# Patient Record
Sex: Male | Born: 1975 | Race: Black or African American | Hispanic: No | Marital: Single | Smoking: Current some day smoker
Health system: Southern US, Community
[De-identification: ages and names within clinical notes are randomized; demographics above are authoritative.]

## PROBLEM LIST (undated history)

## (undated) DIAGNOSIS — J189 Pneumonia, unspecified organism: Secondary | ICD-10-CM

## (undated) DIAGNOSIS — L732 Hidradenitis suppurativa: Secondary | ICD-10-CM

## (undated) DIAGNOSIS — I509 Heart failure, unspecified: Secondary | ICD-10-CM

## (undated) DIAGNOSIS — W3400XA Accidental discharge from unspecified firearms or gun, initial encounter: Secondary | ICD-10-CM

## (undated) HISTORY — PX: OTHER SURGICAL HISTORY: SHX169

---

## 2012-02-02 DIAGNOSIS — W3400XA Accidental discharge from unspecified firearms or gun, initial encounter: Secondary | ICD-10-CM

## 2012-02-02 HISTORY — DX: Accidental discharge from unspecified firearms or gun, initial encounter: W34.00XA

## 2016-03-04 HISTORY — PX: OTHER SURGICAL HISTORY: SHX169

## 2016-05-10 ENCOUNTER — Emergency Department (HOSPITAL_COMMUNITY): Payer: BLUE CROSS/BLUE SHIELD

## 2016-05-10 ENCOUNTER — Encounter (HOSPITAL_COMMUNITY): Payer: Self-pay

## 2016-05-10 ENCOUNTER — Inpatient Hospital Stay (HOSPITAL_COMMUNITY)
Admission: EM | Admit: 2016-05-10 | Discharge: 2016-05-18 | DRG: 286 | Disposition: A | Discharge status: Home / Self Care | Payer: BLUE CROSS/BLUE SHIELD | Attending: Internal Medicine | Admitting: Internal Medicine

## 2016-05-10 DIAGNOSIS — I513 Intracardiac thrombosis, not elsewhere classified: Secondary | ICD-10-CM | POA: Insufficient documentation

## 2016-05-10 DIAGNOSIS — I5021 Acute systolic (congestive) heart failure: Principal | ICD-10-CM | POA: Diagnosis present

## 2016-05-10 DIAGNOSIS — F17210 Nicotine dependence, cigarettes, uncomplicated: Secondary | ICD-10-CM | POA: Diagnosis present

## 2016-05-10 DIAGNOSIS — I428 Other cardiomyopathies: Secondary | ICD-10-CM | POA: Diagnosis not present

## 2016-05-10 DIAGNOSIS — L732 Hidradenitis suppurativa: Secondary | ICD-10-CM | POA: Diagnosis present

## 2016-05-10 DIAGNOSIS — I429 Cardiomyopathy, unspecified: Secondary | ICD-10-CM

## 2016-05-10 DIAGNOSIS — Z79899 Other long term (current) drug therapy: Secondary | ICD-10-CM | POA: Diagnosis not present

## 2016-05-10 DIAGNOSIS — Z872 Personal history of diseases of the skin and subcutaneous tissue: Secondary | ICD-10-CM | POA: Diagnosis not present

## 2016-05-10 DIAGNOSIS — D509 Iron deficiency anemia, unspecified: Secondary | ICD-10-CM | POA: Diagnosis present

## 2016-05-10 DIAGNOSIS — Z8249 Family history of ischemic heart disease and other diseases of the circulatory system: Secondary | ICD-10-CM

## 2016-05-10 DIAGNOSIS — R740 Nonspecific elevation of levels of transaminase and lactic acid dehydrogenase [LDH]: Secondary | ICD-10-CM

## 2016-05-10 DIAGNOSIS — R7989 Other specified abnormal findings of blood chemistry: Secondary | ICD-10-CM

## 2016-05-10 DIAGNOSIS — F1010 Alcohol abuse, uncomplicated: Secondary | ICD-10-CM | POA: Diagnosis present

## 2016-05-10 DIAGNOSIS — R000 Tachycardia, unspecified: Secondary | ICD-10-CM | POA: Diagnosis present

## 2016-05-10 DIAGNOSIS — Z8701 Personal history of pneumonia (recurrent): Secondary | ICD-10-CM

## 2016-05-10 DIAGNOSIS — R0602 Shortness of breath: Secondary | ICD-10-CM | POA: Diagnosis not present

## 2016-05-10 DIAGNOSIS — F17290 Nicotine dependence, other tobacco product, uncomplicated: Secondary | ICD-10-CM | POA: Diagnosis present

## 2016-05-10 DIAGNOSIS — I5082 Biventricular heart failure: Secondary | ICD-10-CM | POA: Diagnosis present

## 2016-05-10 DIAGNOSIS — K760 Fatty (change of) liver, not elsewhere classified: Secondary | ICD-10-CM | POA: Diagnosis present

## 2016-05-10 DIAGNOSIS — I509 Heart failure, unspecified: Secondary | ICD-10-CM

## 2016-05-10 DIAGNOSIS — R570 Cardiogenic shock: Secondary | ICD-10-CM | POA: Diagnosis present

## 2016-05-10 DIAGNOSIS — R945 Abnormal results of liver function studies: Secondary | ICD-10-CM

## 2016-05-10 DIAGNOSIS — R188 Other ascites: Secondary | ICD-10-CM | POA: Diagnosis present

## 2016-05-10 DIAGNOSIS — R0902 Hypoxemia: Secondary | ICD-10-CM | POA: Diagnosis present

## 2016-05-10 DIAGNOSIS — K7200 Acute and subacute hepatic failure without coma: Secondary | ICD-10-CM | POA: Diagnosis present

## 2016-05-10 DIAGNOSIS — R1010 Upper abdominal pain, unspecified: Secondary | ICD-10-CM

## 2016-05-10 DIAGNOSIS — K761 Chronic passive congestion of liver: Secondary | ICD-10-CM | POA: Diagnosis present

## 2016-05-10 DIAGNOSIS — Z9889 Other specified postprocedural states: Secondary | ICD-10-CM | POA: Diagnosis not present

## 2016-05-10 DIAGNOSIS — I426 Alcoholic cardiomyopathy: Secondary | ICD-10-CM | POA: Diagnosis present

## 2016-05-10 DIAGNOSIS — R252 Cramp and spasm: Secondary | ICD-10-CM | POA: Diagnosis present

## 2016-05-10 DIAGNOSIS — R0600 Dyspnea, unspecified: Secondary | ICD-10-CM | POA: Diagnosis present

## 2016-05-10 DIAGNOSIS — R7401 Elevation of levels of liver transaminase levels: Secondary | ICD-10-CM

## 2016-05-10 DIAGNOSIS — N189 Chronic kidney disease, unspecified: Secondary | ICD-10-CM | POA: Diagnosis present

## 2016-05-10 HISTORY — DX: Accidental discharge from unspecified firearms or gun, initial encounter: W34.00XA

## 2016-05-10 HISTORY — DX: Hidradenitis suppurativa: L73.2

## 2016-05-10 HISTORY — DX: Pneumonia, unspecified organism: J18.9

## 2016-05-10 LAB — ECHOCARDIOGRAM COMPLETE
AVLVOTPG: 2 mmHg
CHL CUP MV DEC (S): 201
E decel time: 201 msec
EERAT: 13.42
FS: 9 % — AB (ref 28–44)
HEIGHTINCHES: 72 in
IV/PV OW: 0.87
LA diam index: 2.59 cm/m2
LA vol index: 49.5 mL/m2
LA vol: 99.6 mL
LASIZE: 52 mm
LAVOLA4C: 97.5 mL
LEFT ATRIUM END SYS DIAM: 52 mm
LV E/e' medial: 13.42
LV PW d: 14 mm — AB (ref 0.6–1.1)
LV SIMPSON'S DISK: 22
LV TDI E'LATERAL: 5.52
LV dias vol index: 89 mL/m2
LV dias vol: 179 mL — AB (ref 62–150)
LV e' LATERAL: 5.52 cm/s
LV sys vol index: 70 mL/m2
LV sys vol: 140 mL — AB (ref 21–61)
LVEEAVG: 13.42
LVOT SV: 31 mL
LVOT VTI: 7.57 cm
LVOT area: 4.15 cm2
LVOT diameter: 23 mm
LVOT peak vel: 62 cm/s
Lateral S' vel: 6.83 cm/s
MV Peak grad: 2 mmHg
MV VTI: 102 cm
MV pk E vel: 74.1 m/s
MVPKAVEL: 36.1 m/s
RV sys press: 41 mmHg
Reg peak vel: 253 cm/s
Stroke v: 39 ml
TAPSE: 14.7 mm
TDI e' medial: 4.29
TR max vel: 253 cm/s
WEIGHTICAEL: 2800 [oz_av]

## 2016-05-10 LAB — I-STAT CHEM 8, ED
BUN: 32 mg/dL — AB (ref 6–20)
CHLORIDE: 102 mmol/L (ref 101–111)
CREATININE: 1.1 mg/dL (ref 0.61–1.24)
Calcium, Ion: 0.99 mmol/L — ABNORMAL LOW (ref 1.15–1.40)
Glucose, Bld: 75 mg/dL (ref 65–99)
HCT: 37 % — ABNORMAL LOW (ref 39.0–52.0)
Hemoglobin: 12.6 g/dL — ABNORMAL LOW (ref 13.0–17.0)
POTASSIUM: 5.2 mmol/L — AB (ref 3.5–5.1)
Sodium: 135 mmol/L (ref 135–145)
TCO2: 22 mmol/L (ref 0–100)

## 2016-05-10 LAB — COMPREHENSIVE METABOLIC PANEL
ALBUMIN: 2.7 g/dL — AB (ref 3.5–5.0)
ALK PHOS: 135 U/L — AB (ref 38–126)
ALT: 460 U/L — AB (ref 17–63)
AST: 758 U/L — ABNORMAL HIGH (ref 15–41)
Anion gap: 9 (ref 5–15)
BILIRUBIN TOTAL: 0.9 mg/dL (ref 0.3–1.2)
BUN: 25 mg/dL — ABNORMAL HIGH (ref 6–20)
CALCIUM: 8.6 mg/dL — AB (ref 8.9–10.3)
CO2: 21 mmol/L — ABNORMAL LOW (ref 22–32)
CREATININE: 1.17 mg/dL (ref 0.61–1.24)
Chloride: 101 mmol/L (ref 101–111)
GFR calc non Af Amer: >60 mL/min (ref >60)
Glucose, Bld: 77 mg/dL (ref 65–99)
Potassium: 4.6 mmol/L (ref 3.5–5.1)
Sodium: 131 mmol/L — ABNORMAL LOW (ref 135–145)
TOTAL PROTEIN: 9.3 g/dL — AB (ref 6.5–8.1)

## 2016-05-10 LAB — LIPASE, BLOOD: Lipase: 15 U/L (ref 11–51)

## 2016-05-10 LAB — CBC WITH DIFFERENTIAL/PLATELET
Basophils Absolute: 0 10*3/uL (ref 0.0–0.1)
Basophils Relative: 0 %
Eosinophils Absolute: 0.1 10*3/uL (ref 0.0–0.7)
Eosinophils Relative: 1 %
HEMATOCRIT: 32.1 % — AB (ref 39.0–52.0)
HEMOGLOBIN: 9.9 g/dL — AB (ref 13.0–17.0)
LYMPHS ABS: 3 10*3/uL (ref 0.7–4.0)
Lymphocytes Relative: 27 %
MCH: 24.6 pg — AB (ref 26.0–34.0)
MCHC: 30.8 g/dL (ref 30.0–36.0)
MCV: 79.9 fL (ref 78.0–100.0)
MONOS PCT: 10 %
Monocytes Absolute: 1.2 10*3/uL — ABNORMAL HIGH (ref 0.1–1.0)
NEUTROS ABS: 6.9 10*3/uL (ref 1.7–7.7)
NEUTROS PCT: 62 %
Platelets: 321 10*3/uL (ref 150–400)
RBC: 4.02 MIL/uL — ABNORMAL LOW (ref 4.22–5.81)
RDW: 20.2 % — ABNORMAL HIGH (ref 11.5–15.5)
WBC: 11.2 10*3/uL — ABNORMAL HIGH (ref 4.0–10.5)

## 2016-05-10 LAB — URINALYSIS, ROUTINE W REFLEX MICROSCOPIC
Bilirubin Urine: NEGATIVE
GLUCOSE, UA: NEGATIVE mg/dL
Hgb urine dipstick: NEGATIVE
Ketones, ur: NEGATIVE mg/dL
LEUKOCYTES UA: NEGATIVE
Nitrite: NEGATIVE
PH: 5 (ref 5.0–8.0)
Protein, ur: NEGATIVE mg/dL
SPECIFIC GRAVITY, URINE: 1.012 (ref 1.005–1.030)

## 2016-05-10 LAB — I-STAT TROPONIN, ED: Troponin i, poc: 0.21 ng/mL (ref 0.00–0.08)

## 2016-05-10 LAB — MRSA PCR SCREENING: MRSA BY PCR: NEGATIVE

## 2016-05-10 LAB — TROPONIN I: Troponin I: 0.21 ng/mL (ref <0.03)

## 2016-05-10 LAB — RAPID URINE DRUG SCREEN, HOSP PERFORMED
AMPHETAMINES: NOT DETECTED
BARBITURATES: NOT DETECTED
BENZODIAZEPINES: NOT DETECTED
Cocaine: NOT DETECTED
Opiates: POSITIVE — AB
TETRAHYDROCANNABINOL: NOT DETECTED

## 2016-05-10 LAB — I-STAT CG4 LACTIC ACID, ED
Lactic Acid, Venous: 5.49 mmol/L (ref 0.5–1.9)
Lactic Acid, Venous: 5.66 mmol/L (ref 0.5–1.9)

## 2016-05-10 LAB — BRAIN NATRIURETIC PEPTIDE: B Natriuretic Peptide: 1543 pg/mL — ABNORMAL HIGH (ref 0.0–100.0)

## 2016-05-10 LAB — MONONUCLEOSIS SCREEN: MONO SCREEN: NEGATIVE

## 2016-05-10 MED ORDER — SODIUM CHLORIDE 0.9% FLUSH: 10.0000 mL | INTRAVENOUS | Status: DC | PRN

## 2016-05-10 MED ORDER — HYDROMORPHONE HCL 1 MG/ML IJ SOLN
1.0000 mg | Freq: Once | INTRAMUSCULAR | Status: AC
  Administered 2016-05-10: 1 mg via INTRAVENOUS
  Filled 2016-05-10: qty 1

## 2016-05-10 MED ORDER — ACETAMINOPHEN 325 MG PO TABS: 650.0000 mg | ORAL_TABLET | ORAL | Status: DC | PRN

## 2016-05-10 MED ORDER — ONDANSETRON HCL 4 MG/2ML IJ SOLN: 4.0000 mg | Freq: Four times a day (QID) | INTRAMUSCULAR | Status: DC | PRN

## 2016-05-10 MED ORDER — SODIUM CHLORIDE 0.9 % IV SOLN: 250.0000 mL | INTRAVENOUS | Status: DC | PRN

## 2016-05-10 MED ORDER — PERFLUTREN LIPID MICROSPHERE
1.0000 mL | INTRAVENOUS | Status: AC | PRN
  Administered 2016-05-10 (×2): 1 mL via INTRAVENOUS
  Filled 2016-05-10: qty 10

## 2016-05-10 MED ORDER — FUROSEMIDE 10 MG/ML IJ SOLN
20.0000 mg | Freq: Once | INTRAMUSCULAR | Status: AC
  Administered 2016-05-10: 20 mg via INTRAVENOUS
  Filled 2016-05-10: qty 2

## 2016-05-10 MED ORDER — SODIUM CHLORIDE 0.9% FLUSH: 3.0000 mL | INTRAVENOUS | Status: DC | PRN

## 2016-05-10 MED ORDER — PANTOPRAZOLE SODIUM 40 MG IV SOLR
40.0000 mg | Freq: Once | INTRAVENOUS | Status: AC
  Administered 2016-05-10: 40 mg via INTRAVENOUS
  Filled 2016-05-10: qty 40

## 2016-05-10 MED ORDER — ONDANSETRON HCL 4 MG/2ML IJ SOLN
4.0000 mg | Freq: Once | INTRAMUSCULAR | Status: AC
  Administered 2016-05-10: 4 mg via INTRAVENOUS
  Filled 2016-05-10: qty 2

## 2016-05-10 MED ORDER — VANCOMYCIN HCL IN DEXTROSE 1-5 GM/200ML-% IV SOLN
1000.0000 mg | Freq: Once | INTRAVENOUS | Status: AC
  Administered 2016-05-10: 1000 mg via INTRAVENOUS
  Filled 2016-05-10: qty 200

## 2016-05-10 MED ORDER — HEPARIN (PORCINE) IN NACL 100-0.45 UNIT/ML-% IJ SOLN
2000.0000 [IU]/h | INTRAMUSCULAR | Status: DC
  Administered 2016-05-10: 1300 [IU]/h via INTRAVENOUS
  Administered 2016-05-12 – 2016-05-13 (×2): 1900 [IU]/h via INTRAVENOUS
  Administered 2016-05-13 – 2016-05-15 (×4): 2000 [IU]/h via INTRAVENOUS
  Filled 2016-05-10 (×10): qty 250

## 2016-05-10 MED ORDER — SODIUM CHLORIDE 0.9% FLUSH
10.0000 mL | Freq: Two times a day (BID) | INTRAVENOUS | Status: DC
  Administered 2016-05-11 – 2016-05-12 (×3): 10 mL

## 2016-05-10 MED ORDER — SODIUM CHLORIDE 0.9 % IV BOLUS (SEPSIS)
1000.0000 mL | Freq: Once | INTRAVENOUS | Status: AC
  Administered 2016-05-10: 1000 mL via INTRAVENOUS

## 2016-05-10 MED ORDER — SODIUM CHLORIDE 0.9% FLUSH
3.0000 mL | Freq: Two times a day (BID) | INTRAVENOUS | Status: DC
  Administered 2016-05-10 – 2016-05-18 (×10): 3 mL via INTRAVENOUS

## 2016-05-10 MED ORDER — PIPERACILLIN-TAZOBACTAM 3.375 G IVPB 30 MIN
3.3750 g | Freq: Once | INTRAVENOUS | Status: AC
  Administered 2016-05-10: 3.375 g via INTRAVENOUS
  Filled 2016-05-10: qty 50

## 2016-05-10 MED ORDER — HEPARIN BOLUS VIA INFUSION
4000.0000 [IU] | Freq: Once | INTRAVENOUS | Status: AC
  Administered 2016-05-10: 4000 [IU] via INTRAVENOUS

## 2016-05-10 MED ORDER — ORAL CARE MOUTH RINSE
15.0000 mL | Freq: Two times a day (BID) | OROMUCOSAL | Status: DC
  Administered 2016-05-10 – 2016-05-15 (×3): 15 mL via OROMUCOSAL
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
## 2016-05-10 NOTE — ED Triage Notes (Signed)
Patient sent to ED by [facility] in [place]. Patient states he was admitted to [facility] for Pneumonia and d/c Saturday, had follow-up apportionment today ad oxygen was in the 80's. Patient on 2 LPM of oxygen now sats at 100%. Complains of abdominal pain with n/v/d.

## 2016-05-10 NOTE — Progress Notes (Signed)
*  PRELIMINARY RESULTS* Echocardiogram 2D Echocardiogram has been performed with Definity.  Stacey Drain 05/10/2016, 3:41 PM

## 2016-05-10 NOTE — Progress Notes (Addendum)
   Patient transferred from [facility] with LVEF 15-20% and low output heart failure as well as LV apical mural thrombus, 2.4 x 1.5 cm. I have ordered a PICC line and seen patient. He is on IV heparin. He appears stable at this time. He denies CP and SOB. He is tachycardic HR 110. BP 129/101. He asked that I call his mother, Bonita Quin, to give her an update, which I did. Dr. Diona Browner will see tomorrow AM and then Dr. Gala Romney will assume care after lunch time tomorrow.   Cline Crock PA-C  [facility]

## 2016-05-10 NOTE — ED Notes (Signed)
Report given.

## 2016-05-10 NOTE — ED Notes (Signed)
Spoke with 4N to try report and the bed he is assigned is not going to be his.
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
## 2016-05-10 NOTE — ED Notes (Addendum)
Abnormal lab result.  Lactic Acid 5.66  Dr. Rinaldo Ratel informed.
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
## 2016-05-10 NOTE — ED Provider Notes (Signed)
Patient will be transferred to [facility] to be cared for by [facility] and the Heart failure team. The cardiologist Dr. Diona Browner felt like the patient should not wait in the emergency department at [facility] for a bed at [facility]. It was felt the patient should get down to the emergency department at [facility] to weight-bear to get any better at [facility].   Bethann Berkshire, MD 05/10/16 [phone]

## 2016-05-10 NOTE — Progress Notes (Signed)
Peripherally Inserted Central Catheter/Midline Placement  The IV Nurse has discussed with the patient and/or persons authorized to consent for the patient, the purpose of this procedure and the potential benefits and risks involved with this procedure.  The benefits include less needle sticks, lab draws from the catheter, and the patient may be discharged home with the catheter. Risks include, but not limited to, infection, bleeding, blood clot (thrombus formation), and puncture of an artery; nerve damage and irregular heartbeat and possibility to perform a PICC exchange if needed/ordered by physician.  Alternatives to this procedure were also discussed.  Bard Power PICC patient education guide, fact sheet on infection prevention and patient information card has been provided to patient /or left at bedside.    PICC/Midline Placement Documentation        Juliah Scadden, Lajean Manes 05/10/2016, 11:00 PM

## 2016-05-10 NOTE — Progress Notes (Signed)
Upon introduction and assessment of pt, pt verbalizes that he doesn't know why he is here, what where the results of the tests performed today nor what his plan of care is. On call NP called and informed that pt has several questions. Per cardiology note the results of echocardiogram were discussed with pt and his mother. NP on call asked to round on pt to answer his questions. Will continue to monitor and provide emotional support as possible for pt.

## 2016-05-10 NOTE — ED Provider Notes (Signed)
[facility]-EM[facility]Y DEPT Provider Note   CSN: [id number] Arrival date & time: 05/10/16  1058  By signing my name below, I, Modena Jansky, attest that this documentation has been prepared under the direction and in the presence of Bethann Berkshire, MD. Electronically Signed: Modena Jansky, Scribe. 05/10/2016. 11:32 AM.  History   Chief Complaint Chief Complaint  Patient presents with  . Shortness of Breath   The history is provided by the patient. No language interpreter was used.  Shortness of Breath  This is a new problem. The problem occurs intermittently.The current episode started 6 to 12 hours ago. The problem has been gradually improving. Associated symptoms include a fever (Subjective), vomiting and abdominal pain. Pertinent negatives include no headaches, no cough, no chest pain and no rash. Treatments tried: Oxygen therapy by ED. Associated medical issues include pneumonia.   HPI Comments: Paul Hahn is a 41 y.o. male who presents to the Emergency Department complaining of intermittent SOB that started today. He states he was admitted at a hospital in [place] for pneumonia last week and  discharged 6 days ago. He has been having GI symptoms since then and today he had a low oxygen saturation (~80%) at his follow-up appointment, so he was sent to the ED. He was given 2L/min oxygen in the ED with some relief. He reports associated fever (subjective), abdominal pain, nausea, vomiting, and diarrhea. Pt's temperature in the ED today was 98.1. He denies any other complaints.   Past Medical History:  Diagnosis Date  . Pneumonia     There are no active problems to display for this patient.   Past Surgical History:  Procedure Laterality Date  . CYST EXCISION         Home Medications    Prior to Admission medications   Not on File    Family History No family history on file.  Social History Social History  Substance Use Topics  . Smoking status: Current Every Day Smoker      Packs/day: 0.50    Types: Cigars, Cigarettes  . Smokeless tobacco: Never Used  . Alcohol use Yes     Comment: socially     Allergies   Bacitracin   Review of Systems Review of Systems  Constitutional: Positive for fever (Subjective). Negative for appetite change and fatigue.  HENT: Negative for congestion, ear discharge and sinus pressure.   Eyes: Negative for discharge.  Respiratory: Positive for shortness of breath. Negative for cough.   Cardiovascular: Negative for chest pain.  Gastrointestinal: Positive for abdominal pain, diarrhea, nausea and vomiting.  Genitourinary: Negative for frequency and hematuria.  Musculoskeletal: Negative for back pain.  Skin: Negative for rash.  Neurological: Negative for seizures and headaches.  Psychiatric/Behavioral: Negative for hallucinations.     Physical Exam Updated Vital Signs BP 126/89 (BP Location: Left Arm)   Pulse (!) 123   Temp 98.1 F (36.7 C) (Oral)   Resp 21   Ht 6' (1.829 m)   Wt 175 lb (79.4 kg)   SpO2 100%   BMI 23.73 kg/m   Physical Exam  Constitutional: He is oriented to person, place, and time. He appears well-developed.  HENT:  Head: Normocephalic.  Eyes: Conjunctivae and EOM are normal. No scleral icterus.  Neck: Neck supple. No thyromegaly present.  Cardiovascular: Regular rhythm.  Tachycardia present.  Exam reveals no gallop and no friction rub.   No murmur heard. Pulmonary/Chest: No stridor. Tachypnea noted. He has no wheezes. He has no rales. He exhibits no tenderness.  Abdominal: He exhibits no distension. There is tenderness. There is no rebound.  Moderate to severe epigastric tenderness.   Musculoskeletal: Normal range of motion. He exhibits no edema.  Lymphadenopathy:    He has no cervical adenopathy.  Neurological: He is oriented to person, place, and time. He exhibits normal muscle tone. Coordination normal.  Skin: No rash noted. No erythema.  Psychiatric: He has a normal mood and affect.  His behavior is normal.     ED Treatments / Results  DIAGNOSTIC STUDIES: Oxygen Saturation is 100% on 2L/min O2, normal by my interpretation.    COORDINATION OF CARE: 11:36 AM- Pt advised of plan for treatment and pt agrees.  Labs (all labs ordered are listed, but only abnormal results are displayed) Labs Reviewed  CBC WITH DIFFERENTIAL/PLATELET  COMPREHENSIVE METABOLIC PANEL  LIPASE, BLOOD  I-STAT CHEM 8, ED  I-STAT CG4 LACTIC ACID, ED  I-STAT TROPOININ, ED    EKG  EKG Interpretation None       Radiology No results found.  Procedures Procedures (including critical care time)  Medications Ordered in ED Medications  sodium chloride 0.9 % bolus 1,000 mL (not administered)  pantoprazole (PROTONIX) injection 40 mg (not administered)  ondansetron (ZOFRAN) injection 4 mg (not administered)     Initial Impression / Assessment and Plan / ED Course  I have reviewed the triage vital signs and the nursing notes.  Pertinent labs & imaging results that were available during my care of the patient were reviewed by me and considered in my medical decision making (see chart for details). CRITICAL CARE Performed by: Osceola Depaz L Total critical care time:49minutes Critical care time was exclusive of separately billable procedures and treating other patients. Critical care was necessary to treat or prevent imminent or life-threatening deterioration. Critical care was time spent personally by me on the following activities: development of treatment plan with patient and/or surrogate as well as nursing, discussions with consultants, evaluation of patient's response to treatment, examination of patient, obtaining history from patient or surrogate, ordering and performing treatments and interventions, ordering and review of laboratory studies, ordering and review of radiographic studies, pulse oximetry and re-evaluation of patient's condition.   patient with abdominal pain. Sepsis.  Congestive heart failure. He will be admitted to ICU. Cardiology consult pending. Surgical consult pending. Although surgery has stated that the patient will not have his gallbladder removed immediately. Patient will be admitted by medicine  Final Clinical Impressions(s) / ED Diagnoses   Final diagnoses:  None    New Prescriptions New Prescriptions   No medications on file   The chart was scribed for me under my direct supervision.  I personally performed the history, physical, and medical decision making and all procedures in the evaluation of this patient.Bethann Berkshire, MD 05/10/16 [phone]

## 2016-05-11 DIAGNOSIS — I429 Cardiomyopathy, unspecified: Secondary | ICD-10-CM

## 2016-05-11 DIAGNOSIS — R570 Cardiogenic shock: Secondary | ICD-10-CM

## 2016-05-11 DIAGNOSIS — K7200 Acute and subacute hepatic failure without coma: Secondary | ICD-10-CM

## 2016-05-11 DIAGNOSIS — R740 Nonspecific elevation of levels of transaminase and lactic acid dehydrogenase [LDH]: Secondary | ICD-10-CM

## 2016-05-11 LAB — BASIC METABOLIC PANEL
Anion gap: 8 (ref 5–15)
BUN: 27 mg/dL — AB (ref 6–20)
CO2: 19 mmol/L — ABNORMAL LOW (ref 22–32)
CREATININE: 1.42 mg/dL — AB (ref 0.61–1.24)
Calcium: 7.8 mg/dL — ABNORMAL LOW (ref 8.9–10.3)
Chloride: 105 mmol/L (ref 101–111)
Glucose, Bld: 92 mg/dL (ref 65–99)
POTASSIUM: 5 mmol/L (ref 3.5–5.1)
SODIUM: 132 mmol/L — AB (ref 135–145)

## 2016-05-11 LAB — HEPATIC FUNCTION PANEL
ALT: 492 U/L — AB (ref 17–63)
AST: 722 U/L — ABNORMAL HIGH (ref 15–41)
Albumin: 2 g/dL — ABNORMAL LOW (ref 3.5–5.0)
Alkaline Phosphatase: 130 U/L — ABNORMAL HIGH (ref 38–126)
BILIRUBIN INDIRECT: 0.6 mg/dL (ref 0.3–0.9)
Bilirubin, Direct: 0.3 mg/dL (ref 0.1–0.5)
TOTAL PROTEIN: 8.2 g/dL — AB (ref 6.5–8.1)
Total Bilirubin: 0.9 mg/dL (ref 0.3–1.2)

## 2016-05-11 LAB — CBC
HCT: 28.7 % — ABNORMAL LOW (ref 39.0–52.0)
Hemoglobin: 8.5 g/dL — ABNORMAL LOW (ref 13.0–17.0)
MCH: 23.5 pg — ABNORMAL LOW (ref 26.0–34.0)
MCHC: 29.6 g/dL — AB (ref 30.0–36.0)
MCV: 79.5 fL (ref 78.0–100.0)
PLATELETS: 280 10*3/uL (ref 150–400)
RBC: 3.61 MIL/uL — ABNORMAL LOW (ref 4.22–5.81)
RDW: 20.1 % — AB (ref 11.5–15.5)
WBC: 9.4 10*3/uL (ref 4.0–10.5)

## 2016-05-11 LAB — COOXEMETRY PANEL
CARBOXYHEMOGLOBIN: 1.5 % (ref 0.5–1.5)
CARBOXYHEMOGLOBIN: 1.5 % (ref 0.5–1.5)
Carboxyhemoglobin: 2.1 % — ABNORMAL HIGH (ref 0.5–1.5)
METHEMOGLOBIN: 0.8 % (ref 0.0–1.5)
Methemoglobin: 0.7 % (ref 0.0–1.5)
Methemoglobin: 0.6 % (ref 0.0–1.5)
O2 SAT: 43.6 %
O2 SAT: 37.2 %
O2 Saturation: 64 %
TOTAL HEMOGLOBIN: 8.9 g/dL — AB (ref 12.0–16.0)
Total hemoglobin: 8.8 g/dL — ABNORMAL LOW (ref 12.0–16.0)
Total hemoglobin: 8.9 g/dL — ABNORMAL LOW (ref 12.0–16.0)

## 2016-05-11 LAB — IRON AND TIBC
IRON: 11 ug/dL — AB (ref 45–182)
Saturation Ratios: 6 % — ABNORMAL LOW (ref 17.9–39.5)
TIBC: 188 ug/dL — ABNORMAL LOW (ref 250–450)
UIBC: 177 ug/dL

## 2016-05-11 LAB — TROPONIN I
TROPONIN I: 0.22 ng/mL — AB (ref <0.03)
Troponin I: 0.23 ng/mL (ref <0.03)

## 2016-05-11 LAB — LACTIC ACID, PLASMA
LACTIC ACID, VENOUS: 2.5 mmol/L — AB (ref 0.5–1.9)
Lactic Acid, Venous: 2.1 mmol/L (ref 0.5–1.9)

## 2016-05-11 LAB — HEPARIN LEVEL (UNFRACTIONATED)
HEPARIN UNFRACTIONATED: 0.43 [IU]/mL (ref 0.30–0.70)
Heparin Unfractionated: 0.1 IU/mL — ABNORMAL LOW (ref 0.30–0.70)
Heparin Unfractionated: 0.19 IU/mL — ABNORMAL LOW (ref 0.30–0.70)
Heparin Unfractionated: 0.32 IU/mL (ref 0.30–0.70)

## 2016-05-11 LAB — HIV ANTIBODY (ROUTINE TESTING W REFLEX): HIV SCREEN 4TH GENERATION: NONREACTIVE

## 2016-05-11 LAB — MAGNESIUM: Magnesium: 1.8 mg/dL (ref 1.7–2.4)

## 2016-05-11 MED ORDER — FOLIC ACID 1 MG PO TABS
1.0000 mg | ORAL_TABLET | Freq: Every day | ORAL | Status: DC
  Administered 2016-05-11 – 2016-05-18 (×8): 1 mg via ORAL
  Filled 2016-05-11 (×8): qty 1

## 2016-05-11 MED ORDER — VITAMIN B-1 100 MG PO TABS
100.0000 mg | ORAL_TABLET | Freq: Every day | ORAL | Status: DC
  Administered 2016-05-11 – 2016-05-18 (×8): 100 mg via ORAL
  Filled 2016-05-11 (×8): qty 1

## 2016-05-11 MED ORDER — MILRINONE LACTATE IN DEXTROSE 20-5 MG/100ML-% IV SOLN
0.1250 ug/kg/min | INTRAVENOUS | Status: DC
  Administered 2016-05-11 – 2016-05-12 (×2): 0.25 ug/kg/min via INTRAVENOUS
  Administered 2016-05-12: 0.375 ug/kg/min via INTRAVENOUS
  Administered 2016-05-13: 0.25 ug/kg/min via INTRAVENOUS
  Administered 2016-05-14: 0.125 ug/kg/min via INTRAVENOUS
  Filled 2016-05-11 (×6): qty 100

## 2016-05-11 MED ORDER — ZOLPIDEM TARTRATE 5 MG PO TABS
5.0000 mg | ORAL_TABLET | Freq: Every evening | ORAL | Status: DC | PRN
  Administered 2016-05-12: 5 mg via ORAL
  Filled 2016-05-11: qty 1

## 2016-05-11 MED ORDER — LORAZEPAM 2 MG/ML IJ SOLN: 2.0000 mg | INTRAMUSCULAR | Status: DC | PRN

## 2016-05-11 MED ORDER — HEPARIN BOLUS VIA INFUSION
2500.0000 [IU] | Freq: Once | INTRAVENOUS | Status: AC
  Administered 2016-05-11: 2500 [IU] via INTRAVENOUS
  Filled 2016-05-11: qty 2500

## 2016-05-11 MED ORDER — SPIRONOLACTONE 25 MG PO TABS
12.5000 mg | ORAL_TABLET | Freq: Every day | ORAL | Status: DC
  Administered 2016-05-11 – 2016-05-12 (×2): 12.5 mg via ORAL
  Filled 2016-05-11 (×2): qty 1

## 2016-05-11 MED ORDER — FUROSEMIDE 10 MG/ML IJ SOLN
80.0000 mg | Freq: Two times a day (BID) | INTRAMUSCULAR | Status: DC
  Administered 2016-05-11 – 2016-05-12 (×2): 80 mg via INTRAVENOUS
  Filled 2016-05-11 (×2): qty 8

## 2016-05-11 MED ORDER — DIGOXIN 125 MCG PO TABS
0.1250 mg | ORAL_TABLET | Freq: Every day | ORAL | Status: DC
  Administered 2016-05-11 – 2016-05-18 (×8): 0.125 mg via ORAL
  Filled 2016-05-11 (×9): qty 1

## 2016-05-11 MED ORDER — HEPARIN BOLUS VIA INFUSION
2000.0000 [IU] | Freq: Once | INTRAVENOUS | Status: AC
  Administered 2016-05-11: 2000 [IU] via INTRAVENOUS
  Filled 2016-05-11: qty 2000

## 2016-05-11 MED ORDER — ADULT MULTIVITAMIN W/MINERALS CH
1.0000 | ORAL_TABLET | Freq: Every day | ORAL | Status: DC
  Administered 2016-05-11 – 2016-05-18 (×7): 1 via ORAL
  Filled 2016-05-11 (×8): qty 1

## 2016-05-11 MED ORDER — MAGNESIUM SULFATE 2 GM/50ML IV SOLN
2.0000 g | Freq: Once | INTRAVENOUS | Status: AC
  Administered 2016-05-11: 2 g via INTRAVENOUS
  Filled 2016-05-11: qty 50

## 2016-05-11 NOTE — Progress Notes (Signed)
ANTICOAGULATION CONSULT NOTE - Follow-up Consult  Pharmacy Consult for heparin Indication: mural thrombus  Allergies  Allergen Reactions  . Bacitracin Rash    Patient Measurements: Height: 6' (182.9 cm) Weight: 164 lb 0.4 oz (74.4 kg) IBW/kg (Calculated) : 77.6 Heparin Dosing Weight: 74 kg  Vital Signs: Temp: 97.6 F (36.4 C) (03/10 0700) Temp Source: Oral (03/10 0700) BP: 126/99 (03/10 0700) Pulse Rate: 116 (03/10 0700)  Labs:  Recent Labs  05/10/16 1148 05/10/16 1215 05/10/16 1926 05/11/16 0011 05/11/16 0019 05/11/16 0650  HGB 9.9* 12.6*  --   --   --  8.5*  HCT 32.1* 37.0*  --   --   --  28.7*  PLT 321  --   --   --   --  280  HEPARINUNFRC  --   --   --  0.10*  --  0.19*  CREATININE 1.17 1.10  --   --   --  1.42*  TROPONINI  --   --  0.21*  --  0.22* 0.23*    Estimated Creatinine Clearance: 74.2 mL/min (by C-G formula based on SCr of 1.42 mg/dL (H)).  Assessment: 41 yo male on heparin for mural thrombus. Heparin level subtherapeutic 0.19 despite rate increase this last night to heparin drip 1600 units/hr. No issues with line or bleeding reported per RN.  Drop in h/h will watch.  pltc ok  Goal of Therapy:  Heparin level 0.3-0.7 units/ml Monitor platelets by anticoagulation protocol: Yes   Plan:  Rebolus heparin 2000 units Increase heparin gtt to 1800 units/hr F/u 6hr heparin level Daily HL and CBC while on heparin.  Leota Sauers Pharm.D. CPP, BCPS Clinical Pharmacist [phone] 05/11/2016 9:28 AM

## 2016-05-11 NOTE — Progress Notes (Signed)
ANTICOAGULATION CONSULT NOTE - Follow Up Consult  Pharmacy Consult for heparin Indication: mural thrombus  Labs:  Recent Labs  05/10/16 1148 05/10/16 1215 05/10/16 1926  05/11/16 0019 05/11/16 0650 05/11/16 1618 05/11/16 2241  HGB 9.9* 12.6*  --   --   --  8.5*  --   --   HCT 32.1* 37.0*  --   --   --  28.7*  --   --   PLT 321  --   --   --   --  280  --   --   HEPARINUNFRC  --   --   --   < >  --  0.19* 0.32 0.43  CREATININE 1.17 1.10  --   --   --  1.42*  --   --   TROPONINI  --   --  0.21*  --  0.22* 0.23*  --   --   < > = values in this interval not displayed.   Assessment/Plan:  41yo male therapeutic on heparin after rate changes. Will continue gtt at current rate and confirm stable with am labs.   Vernard Gambles, PharmD, BCPS  05/11/2016,11:25 PM

## 2016-05-11 NOTE — Progress Notes (Signed)
Pt heart rate increased to 140s on tele. When this RN entered room, pt was found resting with no complains of pain or sob although pt endorses a brief period of sob "just a little bit ago". Vitals as follows HR 116 BP 103/80 RR 27 and o2 sats 100% on 2liters. MD oncall notified and no orders received at this time. Will continue to monitor.

## 2016-05-11 NOTE — Progress Notes (Signed)
PROGRESS NOTE                                                                                                                                                                                                             Patient Demographics:    Paul Hahn, is a 41 y.o. male, DOB - 1975-09-05, ZOX:[id number]  Admit date - 05/10/2016   Admitting Physician Filbert Schilder, MD  Outpatient Primary MD for the patient is PROVIDER NOT IN SYSTEM  LOS - 1  Outpatient Specialists:None  Chief Complaint  Patient presents with  . Shortness of Breath       Brief Narrative   41 year old male with history of hydradenitis suppurativa, recently treated for pneumonia at [facility] (discharged on 3/3 on further 7 day course of Levaquin) was sent to the [facility] ED from PCP office after he was found to be hypoxic with O2 sat in the 80s. As per records she was diagnosed with right lower lobe pneumonia at [facility], ruled out for PE. He was also found to be anemic with hemoglobin of 7.7. After being discharged home he had severe nausea and vomiting and over the days had increasing shortness of breath unable to lie flat on his back. Denied any fevers, chills, recent travel, bowel or urinary symptoms, headache, loss of consciousness, sick contacts or use of illicit drugs.  In the ED he was tachycardic and tachypnea with O2 sat of 84% on room air. Given IV fluids. Blood work showed elevated lactic acid of 5.5, transaminitis with AST/ALT of 758/460 and alkaline phosphatase of 135, normal bilirubin. Stat 2-D echo showed mild LVH with severely reduced EF of 15-20%, concern for LV apical thrombus and diffuse hypokinesis.  Patient started on IV heparin, cardiology consulted and transferred to [facility] for further management.     Subjective:   Patient complains of shortness of breath on minimal exertion. Denies further  nausea or vomiting.   Assessment  & Plan :    Principal Problem: Acute Nonischemic cardiomyopathy Suspect acute recent viral infection. Associated shock liver.  PICC line placed and patient started on milrinone drip today. Monitor co-ox daily. Strict I/O. Heart failure team will follow.    Active Problems: transminitis and elevated lactic acid. Suspect shock liver. Recheck LFTs and lactate today. Check Hepatitis panel and HIV ab.  [place] abd shows fatty changes.     Apical mural thrombus On IV heparin. Further management per cardiology.   microcytic anemia  check iron panel     Code Status : full code  Family Communication  : none at [place]  Disposition Plan  :stepdown monitoring  Barriers For Discharge : active symptoms  Consults  :   cardiology  Procedures  :  Echo US abd  DVT Prophylaxis  :   IV heparin  Lab Results  Component Value Date   PLT 280 05/11/2016    Antibiotics  :   Anti-infectives    Start     Dose/Rate Route Frequency Ordered Stop   05/10/16 1230  piperacillin-tazobactam (ZOSYN) IVPB 3.375 g     3.375 g 100 mL/hr over 30 Minutes Intravenous  Once 05/10/16 1226 05/10/16 1310   05/10/16 1230  vancomycin (VANCOCIN) IVPB 1000 mg/200 mL premix     1,000 mg 200 mL/hr over 60 Minutes Intravenous  Once 05/10/16 1226 05/10/16 1411        Objective:   Vitals:   05/11/16 0700 05/11/16 0800 05/11/16 0900 05/11/16 1100  BP: (!) 126/99 (!) 124/99 (!) 123/96 (!) 135/105  Pulse: (!) 116 (!) 115 (!) 112 (!) 118  Resp: (!) 24 (!) 27 (!) 30 (!) 25  Temp: 97.6 F (36.4 C)   97.8 F (36.6 C)  TempSrc: Oral   Oral  SpO2: 100% 99% 100% 99%  Weight:      Height:        Wt Readings from Last 3 Encounters:  05/11/16 74.4 kg (164 lb 0.4 oz)     Intake/Output Summary (Last 24 hours) at 05/11/16 1228 Last data filed at 05/11/16 1100  Gross per 24 hour  Intake          1467.71 ml  Output             1000 ml  Net           467.71 ml      Physical Exam  Gen: fatigued HEENT:  pallor+, moist mucosa, supple neck Chest: clear b/l, no added sounds CVS: N S1&S2, no murmurs, rubs or gallop GI: soft,  ND, BS+, mild epigastric tenderness Musculoskeletal: warm, no edema     Data Review:    CBC  Recent Labs Lab 05/10/16 1148 05/10/16 1215 05/11/16 0650  WBC 11.2*  --  9.4  HGB 9.9* 12.6* 8.5*  HCT 32.1* 37.0* 28.7*  PLT 321  --  280  MCV 79.9  --  79.5  MCH 24.6*  --  23.5*  MCHC 30.8  --  29.6*  RDW 20.2*  --  20.1*  LYMPHSABS 3.0  --   --   MONOABS 1.2*  --   --   EOSABS 0.1  --   --   BASOSABS 0.0  --   --     Chemistries   Recent Labs Lab 05/10/16 1148 05/10/16 1215 05/11/16 0650  NA 131* 135 132*  K 4.6 5.2* 5.0  CL 101 102 105  CO2 21*  --  19*  GLUCOSE 77 75 92  BUN 25* 32* 27*  CREATININE 1.17 1.10 1.42*  CALCIUM 8.6*  --  7.8*  AST 758*  --   --   ALT 460*  --   --   ALKPHOS 135*  --   --   BILITOT 0.9  --   --    ------------------------------------------------------------------------------------------------------------------ No results for input(s): CHOL, HDL, LDLCALC, TRIG,  CHOLHDL, LDLDIRECT in the last 72 hours.  No results found for: HGBA1C ------------------------------------------------------------------------------------------------------------------ No results for input(s): TSH, T4TOTAL, T3FREE, THYROIDAB in the last 72 hours.  Invalid input(s): FREET3 ------------------------------------------------------------------------------------------------------------------ No results for input(s): VITAMINB12, FOLATE, FERRITIN, TIBC, IRON, RETICCTPCT in the last 72 hours.  Coagulation profile No results for input(s): INR, PROTIME in the last 168 hours.  No results for input(s): DDIMER in the last 72 hours.  Cardiac Enzymes  Recent Labs Lab 05/10/16 1926 05/11/16 0019 05/11/16 0650  TROPONINI 0.21* 0.22* 0.23*    ------------------------------------------------------------------------------------------------------------------    Component Value Date/Time   BNP 1,543.0 (H) 05/10/2016 1212    Inpatient Medications  Scheduled Meds: . mouth rinse  15 mL Mouth Rinse BID  . sodium chloride flush  10-40 mL Intracatheter Q12H  . sodium chloride flush  3 mL Intravenous Q12H   Continuous Infusions: . heparin 1,800 Units/hr (05/11/16 0943)  . milrinone 0.25 mcg/kg/min (05/11/16 0926)   PRN Meds:.sodium chloride, acetaminophen, ondansetron (ZOFRAN) IV, sodium chloride flush, sodium chloride flush  Micro Results Recent Results (from the past 240 hour(s))  Blood Culture (routine x 2)     Status: None (Preliminary result)   Collection Time: 05/10/16 12:31 PM  Result Value Ref Range Status   Specimen Description BLOOD LEFT ARM  Final   Special Requests BOTTLES DRAWN AEROBIC AND ANAEROBIC 6CC  Final   Culture PENDING  Incomplete   Report Status PENDING  Incomplete  Blood Culture (routine x 2)     Status: None (Preliminary result)   Collection Time: 05/10/16 12:39 PM  Result Value Ref Range Status   Specimen Description BLOOD LEFT ARM  Final   Special Requests BOTTLES DRAWN AEROBIC ONLY 4CC  Final   Culture PENDING  Incomplete   Report Status PENDING  Incomplete  MRSA PCR Screening     Status: None   Collection Time: 05/10/16  6:56 PM  Result Value Ref Range Status   MRSA by PCR NEGATIVE NEGATIVE Final    Comment:        The GeneXpert MRSA Assay (FDA approved for NASAL specimens only), is one component of a comprehensive MRSA colonization surveillance program. It is not intended to diagnose MRSA infection nor to guide or monitor treatment for MRSA infections.     Radiology Reports US Abdomen Complete  Result Date: 05/10/2016 CLINICAL DATA:  Nausea, vomiting, and diarrhea, abdominal pain and abdominal distention for 1 week. EXAM: ABDOMEN ULTRASOUND COMPLETE COMPARISON:  None. FINDINGS:  Gallbladder: The gallbladder wall is markedly thickened up at up to 13 mm. There is a small amount of pericholecystic fluid. The gallbladder is under distended. No stones are identified. Common bile duct: Diameter: 0.2 cm. Liver: No focal lesion. The liver appears dense with mildly increased echogenicity IVC: No abnormality visualized. Pancreas: Visualized portion unremarkable. Spleen: Size and appearance within normal limits. Perisplenic fluid is identified. Right Kidney: Length: 14.3 cm. No mass or hydronephrosis. Echogenicity is markedly increased. Left Kidney: Length: 13.8 cm. No mass or hydronephrosis. Echogenicity is markedly increased. Abdominal aorta: No aneurysm visualized. Other findings: Bilateral pleural effusions are seen. IMPRESSION: Bilateral pleural effusions. Marked increased echogenicity of both kidneys consistent with medical renal disease. Gallbladder wall thickening is likely due to underdistention and small volume of ascites. No stones are identified. Fatty infiltration of the liver. Electronically Signed   By: Drusilla Kanner M.D.   On: 05/10/2016 13:31   Dg Chest Portable 1 View  Result Date: 05/10/2016 CLINICAL DATA:  Cough, fever. EXAM: PORTABLE CHEST 1  VIEW COMPARISON:  None. FINDINGS: Moderate cardiomegaly is noted. No pneumothorax or pleural effusion is noted. Both lungs are clear. The visualized skeletal structures are unremarkable. IMPRESSION: Moderate cardiomegaly.  No acute cardiopulmonary abnormality seen. Electronically Signed   By: Lupita Raider, M.D.   On: 05/10/2016 11:53    Time Spent in minutes  35   Eddie North M.D on 05/11/2016 at 12:28 PM  Between 7am to 7pm - Pager - [phone]  After 7pm go to [email or web address] - password [facility]  [facility] -  Office  [phone]

## 2016-05-11 NOTE — Progress Notes (Signed)
Spoke with patient and visitor regarding visitation.  Patient states visitor drove here over 2.5 hrs with intent to stay with patient.  Informed patient and visitor that the visitor could stay only tonight, and that it would be clarified in the morning as to whether she would be allowed to stay while patient is in the ICU.  Currently patient is calm and is showing no signs of alcohol withdrawal at this time.

## 2016-05-11 NOTE — Progress Notes (Signed)
ANTICOAGULATION CONSULT NOTE - Follow-up Consult  Pharmacy Consult for heparin Indication: mural thrombus  Allergies  Allergen Reactions  . Bacitracin Rash    Patient Measurements: Height: 6' (182.9 cm) Weight: 163 lb 12.8 oz (74.3 kg) IBW/kg (Calculated) : 77.6 Heparin Dosing Weight: 74 kg  Vital Signs: Temp: 98 F (36.7 C) (03/10 0000) Temp Source: Oral (03/10 0000) BP: 131/100 (03/10 0000) Pulse Rate: 111 (03/10 0000)  Labs:  Recent Labs  05/10/16 1148 05/10/16 1215 05/10/16 1926 05/11/16 0011  HGB 9.9* 12.6*  --   --   HCT 32.1* 37.0*  --   --   PLT 321  --   --   --   HEPARINUNFRC  --   --   --  0.10*  CREATININE 1.17 1.10  --   --   TROPONINI  --   --  0.21*  --     Estimated Creatinine Clearance: 95.7 mL/min (by C-G formula based on SCr of 1.1 mg/dL).  Assessment: 41 yo male on heparin for mural thrombus. Heparin level subtherapeutic (0.1) on gtt at 1300 units/hr. No issues with line or bleeding reported per RN.  Goal of Therapy:  Heparin level 0.3-0.7 units/ml Monitor platelets by anticoagulation protocol: Yes   Plan:  Rebolus heparin 2500 units Increase heparin gtt to 1600 units/hr F/u 6hr heparin level Daily HL and CBC while on heparin.  Christoper Fabian, PharmD, BCPS Clinical pharmacist, pager [phone] 05/11/2016,12:51 AM

## 2016-05-11 NOTE — Progress Notes (Signed)
Progress Note  Patient Name: Paul Hahn Date of Encounter: 05/11/2016  Primary Cardiologist: Dr. Nicholes Mango (new CHF evaluation)  Subjective   Short of breath, still has PND/orthopnea. No palpitations or abdominal pain.  Inpatient Medications    Scheduled Meds: . mouth rinse  15 mL Mouth Rinse BID  . sodium chloride flush  10-40 mL Intracatheter Q12H  . sodium chloride flush  3 mL Intravenous Q12H   Continuous Infusions: . heparin 1,600 Units/hr (05/11/16 0101)   PRN Meds: sodium chloride, acetaminophen, ondansetron (ZOFRAN) IV, sodium chloride flush, sodium chloride flush   Vital Signs    Vitals:   05/10/16 2103 05/11/16 0000 05/11/16 0320 05/11/16 0700  BP: (!) 133/100 (!) 131/100 103/80 (!) 126/99  Pulse: (!) 108 (!) 111 (!) 116 (!) 116  Resp: (!) 25 (!) 23 (!) 26 (!) 24  Temp: 97.6 F (36.4 C) 98 F (36.7 C) 97.7 F (36.5 C) 97.6 F (36.4 C)  TempSrc: Oral Oral Oral Oral  SpO2: 100% 100% 100% 100%  Weight:   164 lb 0.4 oz (74.4 kg)   Height:        Intake/Output Summary (Last 24 hours) at 05/11/16 0905 Last data filed at 05/11/16 0800  Gross per 24 hour  Intake          1408.37 ml  Output             1000 ml  Net           408.37 ml   Filed Weights   05/10/16 1110 05/10/16 1852 05/11/16 0320  Weight: 175 lb (79.4 kg) 163 lb 12.8 oz (74.3 kg) 164 lb 0.4 oz (74.4 kg)    Telemetry    Sinus tachycardia. Personally reviewed.  ECG    Tracing from 05/11/2016 shows sinus tachycardia with rightward axis and repolarization abnormalities. Personally reviewed.  Physical Exam   GEN: Short of breath at rest.   Neck:  Elevated JVP. Cardiac: Indistinct PMI, RRR, S3 gallop.  Respiratory:  Decreased breath breath sounds at bases. GI: Soft, nontender, bowel sounds present. MS: No edema; No deformity. Neuro:  Nonfocal. Psych: Alert and oriented x 3. Normal affect.  Labs    Chemistry Recent Labs Lab 05/10/16 1148 05/10/16 1215 05/11/16 0650    NA 131* 135 132*  K 4.6 5.2* 5.0  CL 101 102 105  CO2 21*  --  19*  GLUCOSE 77 75 92  BUN 25* 32* 27*  CREATININE 1.17 1.10 1.42*  CALCIUM 8.6*  --  7.8*  PROT 9.3*  --   --   ALBUMIN 2.7*  --   --   AST 758*  --   --   ALT 460*  --   --   ALKPHOS 135*  --   --   BILITOT 0.9  --   --   GFRNONAA >60  --  >60  GFRAA >60  --  >60  ANIONGAP 9  --  8     Hematology Recent Labs Lab 05/10/16 1148 05/10/16 1215 05/11/16 0650  WBC 11.2*  --  9.4  RBC 4.02*  --  3.61*  HGB 9.9* 12.6* 8.5*  HCT 32.1* 37.0* 28.7*  MCV 79.9  --  79.5  MCH 24.6*  --  23.5*  MCHC 30.8  --  29.6*  RDW 20.2*  --  20.1*  PLT 321  --  280    Cardiac Enzymes Recent Labs Lab 05/10/16 1926 05/11/16 0019 05/11/16 0650  TROPONINI 0.21* 0.22* 0.23*  Recent Labs Lab 05/10/16 1219  TROPIPOC 0.21*     BNP Recent Labs Lab 05/10/16 1212  BNP 1,543.0*     Radiology    US Abdomen Complete  Result Date: 05/10/2016 CLINICAL DATA:  Nausea, vomiting, and diarrhea, abdominal pain and abdominal distention for 1 week. EXAM: ABDOMEN ULTRASOUND COMPLETE COMPARISON:  None. FINDINGS: Gallbladder: The gallbladder wall is markedly thickened up at up to 13 mm. There is a small amount of pericholecystic fluid. The gallbladder is under distended. No stones are identified. Common bile duct: Diameter: 0.2 cm. Liver: No focal lesion. The liver appears dense with mildly increased echogenicity IVC: No abnormality visualized. Pancreas: Visualized portion unremarkable. Spleen: Size and appearance within normal limits. Perisplenic fluid is identified. Right Kidney: Length: 14.3 cm. No mass or hydronephrosis. Echogenicity is markedly increased. Left Kidney: Length: 13.8 cm. No mass or hydronephrosis. Echogenicity is markedly increased. Abdominal aorta: No aneurysm visualized. Other findings: Bilateral pleural effusions are seen. IMPRESSION: Bilateral pleural effusions. Marked increased echogenicity of both kidneys consistent  with medical renal disease. Gallbladder wall thickening is likely due to underdistention and small volume of ascites. No stones are identified. Fatty infiltration of the liver. Electronically Signed   By: Drusilla Kanner M.D.   On: 05/10/2016 13:31   Dg Chest Portable 1 View  Result Date: 05/10/2016 CLINICAL DATA:  Cough, fever. EXAM: PORTABLE CHEST 1 VIEW COMPARISON:  None. FINDINGS: Moderate cardiomegaly is noted. No pneumothorax or pleural effusion is noted. Both lungs are clear. The visualized skeletal structures are unremarkable. IMPRESSION: Moderate cardiomegaly.  No acute cardiopulmonary abnormality seen. Electronically Signed   By: Lupita Raider, M.D.   On: 05/10/2016 11:53    Cardiac Studies   Echocardiogram 05/10/2016: Study Conclusions  - Left ventricle: The cavity size was moderately dilated. Wall   thickness was increased in a pattern of mild LVH. Systolic   function was severely reduced. The estimated ejection fraction   was in the range of 15% to 20%. LV apical echodensity consistent   with thrombus, measures 2.4 x 1.5 cm. Diffuse hypokinesis. The   study is not technically sufficient to allow evaluation of LV   diastolic function. - Ventricular septum: The contour showed diastolic flattening. - Mitral valve: There was mild regurgitation. - Left atrium: The atrium was moderately dilated. - Right ventricle: The cavity size was mildly to moderately   dilated. Systolic function was moderately reduced. - Right atrium: The atrium was severely dilated. Central venous   pressure (est): 15 mm Hg. - Atrial septum: The septum bowed from right to left, consistent   with increased right atrial pressure. - Pulmonary arteries: PA peak pressure: 41 mm Hg (S). - Pericardium, extracardiac: A trivial pericardial effusion was   identified posterior to the heart.  Impressions:  - Mild LVH with moderate chamber dilatation and LVEF in the range   of 15-20%. There is diffuse  hypokinesis, best contractile   function noted at the lateral base. Suggestive of nonischemic   cardiomyopathy. LV apical mural thrombus identified. There is   spontaneous echocontrast noted to mild degree and also low stroke   volume consistent with low cardiac output. Moderate left atrial   enlargement. Mild mitral regurgitation. Moderately dilated right   ventricle with moderately reduced contraction. Severe right   atrial enlargement. Moderate to severe tricuspid regurgitation   with estimated PASP 41 mmHg and evidence of elevated CVP. Trivial   posterior pericardial effusion.  Patient Profile     41 y.o. male the history  of tobacco abuse and recent reported pneumonia treated in [place], presents with progressive weakness and shortness of breath, found to be in low output heart failure with presumably acute biventricular dysfunction. LVEF 15-20% by echocardiogram with moderate right ventricular dysfunction. Also noted to have LV apical mural thrombus.  Assessment & Plan    1. Acute biventricular heart failure, LVEF 15-20% and moderate right ventricular dysfunction with evidence of low output and cardiogenic shock. PICC line placed with co-oximetry 44. Starting milrinone. Dr. Morrie Sheldon to see on heart failure team later today.  2. 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 Reports recent treatment for pneumonia in [place]. Could have potentially been viral etiology although details of hospitalization or not clear.  3. Tobacco abuse.  4. Elevated LFTs, presumably secondary to hepatic congestion and shock. Lactic acid also elevated at presentation.  5. Renal insufficiency, creatinine up to 1.4. Making recently urine output, 1000 cc out without diuretic.  Starting milrinone this morning as outlined above. Dr. Gala Romney will see him on rounds later this afternoon as well for formal heart failure consultation. Continue heparin with LV apical mural thrombus. Follow urine output and  creatinine closely. Will also transduce CVP as may need additional diuretic on top of inotropes. Plan to start further afterload reduction next depending on how he tolerates the above treatments.  Signed, Nona Dell, MD  05/11/2016, 9:05 AM

## 2016-05-11 NOTE — Progress Notes (Signed)
ANTICOAGULATION CONSULT NOTE - Follow-up Consult  Pharmacy Consult for heparin Indication: mural thrombus  Allergies  Allergen Reactions  . Bacitracin Rash    Patient Measurements: Height: 6' (182.9 cm) Weight: 164 lb 0.4 oz (74.4 kg) IBW/kg (Calculated) : 77.6 Heparin Dosing Weight: 74 kg  Vital Signs: Temp: 98.1 F (36.7 C) (03/10 1500) Temp Source: Oral (03/10 1500) BP: 127/101 (03/10 1500) Pulse Rate: 121 (03/10 1500)  Labs:  Recent Labs  05/10/16 1148 05/10/16 1215 05/10/16 1926 05/11/16 0011 05/11/16 0019 05/11/16 0650 05/11/16 1618  HGB 9.9* 12.6*  --   --   --  8.5*  --   HCT 32.1* 37.0*  --   --   --  28.7*  --   PLT 321  --   --   --   --  280  --   HEPARINUNFRC  --   --   --  0.10*  --  0.19* 0.32  CREATININE 1.17 1.10  --   --   --  1.42*  --   TROPONINI  --   --  0.21*  --  0.22* 0.23*  --     Estimated Creatinine Clearance: 74.2 mL/min (by C-G formula based on SCr of 1.42 mg/dL (H)).  Assessment: 41 yo male on heparin for mural thrombus. Heparin level is at the low end of goal on 1800 units/hr.  Goal of Therapy:  Heparin level 0.3-0.7 units/ml Monitor platelets by anticoagulation protocol: Yes   Plan:  -Increase heparin to 1850 units/hr to keep in goal -Heparin level later today to confirm  Harland German, Pharm D 05/11/2016 4:45 PM
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
## 2016-05-12 ENCOUNTER — Encounter (HOSPITAL_COMMUNITY): Payer: Self-pay

## 2016-05-12 LAB — HEPATITIS PANEL, ACUTE
HCV Ab: 0.1 s/co ratio (ref 0.0–0.9)
HEP A IGM: NEGATIVE
HEP B C IGM: NEGATIVE
Hepatitis B Surface Ag: NEGATIVE

## 2016-05-12 LAB — BASIC METABOLIC PANEL
Anion gap: 9 (ref 5–15)
BUN: 24 mg/dL — AB (ref 6–20)
CO2: 24 mmol/L (ref 22–32)
Calcium: 7.8 mg/dL — ABNORMAL LOW (ref 8.9–10.3)
Chloride: 97 mmol/L — ABNORMAL LOW (ref 101–111)
Creatinine, Ser: 1.2 mg/dL (ref 0.61–1.24)
GFR calc Af Amer: >60 mL/min (ref >60)
GFR calc non Af Amer: >60 mL/min (ref >60)
GLUCOSE: 190 mg/dL — AB (ref 65–99)
POTASSIUM: 3.3 mmol/L — AB (ref 3.5–5.1)
Sodium: 130 mmol/L — ABNORMAL LOW (ref 135–145)

## 2016-05-12 LAB — ABO/RH: ABO/RH(D): O POS

## 2016-05-12 LAB — CBC
HCT: 31.1 % — ABNORMAL LOW (ref 39.0–52.0)
HEMOGLOBIN: 9.5 g/dL — AB (ref 13.0–17.0)
MCH: 24 pg — AB (ref 26.0–34.0)
MCHC: 30.5 g/dL (ref 30.0–36.0)
MCV: 78.5 fL (ref 78.0–100.0)
Platelets: 260 10*3/uL (ref 150–400)
RBC: 3.96 MIL/uL — ABNORMAL LOW (ref 4.22–5.81)
RDW: 20 % — AB (ref 11.5–15.5)
WBC: 10.4 10*3/uL (ref 4.0–10.5)

## 2016-05-12 LAB — TYPE AND SCREEN
ABO/RH(D): O POS
ANTIBODY SCREEN: NEGATIVE

## 2016-05-12 LAB — COOXEMETRY PANEL
Carboxyhemoglobin: 2.3 % — ABNORMAL HIGH (ref 0.5–1.5)
Methemoglobin: 0.6 % (ref 0.0–1.5)
O2 Saturation: 71.6 %
TOTAL HEMOGLOBIN: 9.6 g/dL — AB (ref 12.0–16.0)

## 2016-05-12 LAB — HIV ANTIBODY (ROUTINE TESTING W REFLEX): HIV SCREEN 4TH GENERATION: NONREACTIVE

## 2016-05-12 LAB — HEPARIN LEVEL (UNFRACTIONATED): Heparin Unfractionated: 0.35 IU/mL (ref 0.30–0.70)

## 2016-05-12 MED ORDER — CHLORHEXIDINE GLUCONATE CLOTH 2 % EX PADS
6.0000 | MEDICATED_PAD | Freq: Every day | CUTANEOUS | Status: DC
  Administered 2016-05-13 – 2016-05-16 (×4): 6 via TOPICAL

## 2016-05-12 MED ORDER — SODIUM CHLORIDE 0.9% FLUSH
10.0000 mL | Freq: Two times a day (BID) | INTRAVENOUS | Status: DC
  Administered 2016-05-12: 10 mL

## 2016-05-12 MED ORDER — LOSARTAN POTASSIUM 25 MG PO TABS
12.5000 mg | ORAL_TABLET | Freq: Two times a day (BID) | ORAL | Status: DC
Start: 2016-05-12 — End: 2016-05-13
  Administered 2016-05-12: 12.5 mg via ORAL
  Filled 2016-05-12: qty 1

## 2016-05-12 MED ORDER — MAGNESIUM SULFATE 2 GM/50ML IV SOLN: 2.0000 g | Freq: Once | INTRAVENOUS | Status: DC

## 2016-05-12 MED ORDER — SODIUM CHLORIDE 0.9% FLUSH
10.0000 mL | INTRAVENOUS | Status: DC | PRN
  Administered 2016-05-14: 10 mL
  Filled 2016-05-12: qty 40

## 2016-05-12 MED ORDER — SPIRONOLACTONE 25 MG PO TABS
25.0000 mg | ORAL_TABLET | Freq: Every day | ORAL | Status: DC
  Administered 2016-05-13 – 2016-05-18 (×6): 25 mg via ORAL
  Filled 2016-05-12 (×6): qty 1

## 2016-05-12 MED ORDER — POTASSIUM CHLORIDE CRYS ER 20 MEQ PO TBCR
40.0000 meq | EXTENDED_RELEASE_TABLET | Freq: Two times a day (BID) | ORAL | Status: DC
  Administered 2016-05-12 (×2): 40 meq via ORAL
  Filled 2016-05-12 (×2): qty 2

## 2016-05-12 NOTE — Consult Note (Signed)
WOC Nurse wound consult note Reason for Consult:PAtient with history of hydradenitis suppurativa and is s/p extensive excision and unroofing of perineal and perianal lesions in January of this year at a [place] facility. Family has been providing care at home with NS dressings following cleansing with antimicrobial soap (liquid Dial) and thorough rinsing. Wound type: Surgical I&D at site of perineal and perianal hydradenitis suppurativa lesions Pressure Injury POA: No. Wound is POA, but it is not a PrI. Measurement: 8cm x 10cm, but 5cm toward left buttock is reepithelializing and near closure.  The main portion of wound now measures 8cm x 4cm x 0.1cm.  There is evidence of contraction at periphery and granulation throughout. Wound bed:Red, moist, granulating and contracting in center, reepithelialization occurring at left margin. Drainage (amount, consistency, odor) Small amount of serous drainage, no odor. Periwound: Intact, no maceration, no induration, no warmth Dressing procedure/placement/frequency: I will continue the surgical POC with twice daily saline moistened gauze dressings and an ABD pad topper.  These are to be held in place with mesh briefs, no tape is advised. Patient and family member are familiar with this dressing regimen and are able to perform self care upon discharge. WOC nursing team will not follow, but will remain available to this patient, the nursing and medical teams.  Please re-consult if needed. Thank you for this consultation. Ladona Mow, MSN, RN, GNP, [place]  Pager# [phone]

## 2016-05-12 NOTE — Progress Notes (Signed)
Pt. And several family members have been educated multiple times about visiting times and about having only two visitors in the room at a time.  Also educated about not having people bringing outside food.  He is very upset about visiting times because his family lives far away.  He was informed yesterday and was told to tell them the visiting times so they didn't show up during quiet hours.  Him and his family are still being not compliant with rules.

## 2016-05-12 NOTE — Progress Notes (Addendum)
Advanced Heart Failure Team Rounding Note   Reason for consult: Cardiogenic shock  Consulting MD: Mcdowell   HPI:    Reportedly admitted for PNA several weeks ago. Since that time has felt very rundown and SOB with any activity. Admitted with HF. Echo LVEF 10% with large apical thrombus. RV moderate to severe HK. Severe TR. Drinks about 1 case of beer per day + liquor. Smokes 0.5-1ppd.  Now on milrinone 0.375. Co-ox  37%-> 72%. Feels better. Diuresed well. Weight down 8 pounds.  Lactate down. Breathing improved. No orthopnea. K 3.3 SBP 120-130s  On heparin for LV clot. Hepatitis panels pending. 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 More comfortable HEENT: normal flat. Carotids 2+ bilat; no bruits. No lymphadenopathy or thryomegaly appreciated. Cor: PMI laterally displaced. Tachy regular + summation gallop. 2/6 TR/MR Lungs: clear Abdomen: soft, nontender, nondistended. Liver edge downNo bruits or masses. Good bowel sounds. Extremities: no cyanosis, clubbing, rash, edema. Warm. Multiple tattoos Neuro: alert & orientedx3, cranial nerves grossly intact. moves all 4 extremities w/o difficulty. Affect pleasant  Telemetry: Sinus tach 110-120  Labs: Basic Metabolic Panel:  Recent Labs Lab 05/10/16 1148 05/10/16 1215 05/11/16 0650 05/11/16 1745 05/12/16 0438  NA  131* 135 132*  --  130*  K 4.6 5.2* 5.0  --  3.3*  CL 101 102 105  --  97*  CO2 21*  --  19*  --  24  GLUCOSE 77 75 92  --  190*  BUN 25* 32* 27*  --  24*  CREATININE 1.17 1.10 1.42*  --  1.20  CALCIUM 8.6*  --  7.8*  --  7.8*  MG  --   --   --  1.8  --     Liver Function Tests:  Recent Labs Lab 05/10/16 1148 05/11/16 1144  AST 758* 722*  ALT 460* 492*  ALKPHOS 135* 130*  BILITOT 0.9 0.9  PROT 9.3* 8.2*  ALBUMIN 2.7* 2.0*    Recent Labs Lab 05/10/16 1148  LIPASE 15   No results for input(s): AMMONIA in the last 168 hours.  CBC:  Recent Labs Lab 05/10/16 1148 05/10/16 1215 05/11/16 0650 05/12/16 0438  WBC 11.2*  --  9.4 10.4  NEUTROABS 6.9  --   --   --   HGB 9.9* 12.6* 8.5* 9.5*  HCT 32.1* 37.0* 28.7* 31.1*  MCV 79.9  --  79.5 78.5  PLT 321  --  280 260    Cardiac Enzymes:  Recent Labs Lab 05/10/16 1926 05/11/16 0019 05/11/16 0650  TROPONINI 0.21* 0.22* 0.23*    BNP: BNP (last 3 results)  Recent Labs  05/10/16 1212  BNP 1,543.0*    ProBNP (last 3 results) No results for input(s): PROBNP in the last 8760  hours.   CBG: No results for input(s): GLUCAP in the last 168 hours.  Coagulation Studies: No results for input(s): LABPROT, INR in the last 72 hours.  Other results:   Imaging: US Abdomen Complete  Result Date: 05/10/2016 CLINICAL DATA:  Nausea, vomiting, and diarrhea, abdominal pain and abdominal distention for 1 week. EXAM: ABDOMEN ULTRASOUND COMPLETE COMPARISON:  None. FINDINGS: Gallbladder: The gallbladder wall is markedly thickened up at up to 13 mm. There is a small amount of pericholecystic fluid. The gallbladder is under distended. No stones are identified. Common bile duct: Diameter: 0.2 cm. Liver: No focal lesion. The liver appears dense with mildly increased echogenicity IVC: No abnormality visualized. Pancreas: Visualized portion unremarkable. Spleen: Size and appearance within normal limits. Perisplenic fluid is  identified. Right Kidney: Length: 14.3 cm. No mass or hydronephrosis. Echogenicity is markedly increased. Left Kidney: Length: 13.8 cm. No mass or hydronephrosis. Echogenicity is markedly increased. Abdominal aorta: No aneurysm visualized. Other findings: Bilateral pleural effusions are seen. IMPRESSION: Bilateral pleural effusions. Marked increased echogenicity of both kidneys consistent with medical renal disease. Gallbladder wall thickening is likely due to underdistention and small volume of ascites. No stones are identified. Fatty infiltration of the liver. Electronically Signed   By: Drusilla Kanner M.D.   On: 05/10/2016 13:31     Assessment:   1. Cardiogenic shock 2. 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 Large LV thrombus 4. Shock liver 5. CKD, 3   --renal u/s shows bilateral medico-renal disease 6. Iron deficient anemia 7. ETOH abuse    --drinks about 1 case beer + liquor daily 8. Ongoing tobacco use     --0.5-1ppd 9. Hydradrenitis suppurtivae    --s/p excision 1/18    --wound care now following  Plan/Discussion:     He was admitted with profound cardiogenic shock due to biventricular failure. Given appearance on echo, calcified LV thrombus and end-organ issues suspect his cardiomyopathy is not recent. Likely ETOH-related (versus viral).   Much improved with milrinone and diuresis. Co-ox 75%. CVP 3-4. Will turn down milrinone to 0.25. Stop lasix. Increase spiro. Add losartan 12.5 bid. Continue digoxin.   Will need R/L cath this week.   Continue heparin for LV thrombus. Discuss risk of CVA from embolic phenomenon.   Discuss need to avoid ETOG completely. HIV negative. Hepatitis panels pending.   Blood type O+. He is not candidate for transplant due to ETOH and tobacco use. Also not candidate for Impella with LV clot and RV dysfunction.  Not ideal VAD candidate with RV dysfunction  and ETOH use. I discussed risk of worsening HF, end-organ damage, death and CVA.   Watch for ETOH withdrawal.   Wound care following.   Total CCT 40 minutes.   Length of Stay: 2 Arvilla Meres MD 05/12/2016, 1:11 PM  Advanced Heart Failure Team Pager [phone] (M-F; 7a - 4p)  Please contact [facility] for night-coverage after hours (4p -7a ) and weekends on [email or web address]

## 2016-05-12 NOTE — Progress Notes (Signed)
ANTICOAGULATION CONSULT NOTE - Follow-up Consult  Pharmacy Consult for heparin Indication: mural thrombus  Allergies  Allergen Reactions  . Bacitracin Rash    Patient Measurements: Height: 6' (182.9 cm) Weight: 156 lb 12 oz (71.1 kg) IBW/kg (Calculated) : 77.6 Heparin Dosing Weight: 74 kg  Vital Signs: Temp: 98.6 F (37 C) (03/11 0440) Temp Source: Oral (03/11 0440) Pulse Rate: 123 (03/11 0500)  Labs:  Recent Labs  05/10/16 1148 05/10/16 1215 05/10/16 1926  05/11/16 0019 05/11/16 0650 05/11/16 1618 05/11/16 2241 05/12/16 0438 05/12/16 0439  HGB 9.9* 12.6*  --   --   --  8.5*  --   --  9.5*  --   HCT 32.1* 37.0*  --   --   --  28.7*  --   --  31.1*  --   PLT 321  --   --   --   --  280  --   --  260  --   HEPARINUNFRC  --   --   --   < >  --  0.19* 0.32 0.43  --  0.35  CREATININE 1.17 1.10  --   --   --  1.42*  --   --  1.20  --   TROPONINI  --   --  0.21*  --  0.22* 0.23*  --   --   --   --   < > = values in this interval not displayed.  Estimated Creatinine Clearance: 83.9 mL/min (by C-G formula based on SCr of 1.2 mg/dL).  Assessment: 41 yo male on heparin for mural thrombus. Heparin level 0.35 at low end of goal on heparin drip rate 1850 uts/hr.   No issues with line or bleeding reported per RN.  H/H low stable with PLTC wnl.    Goal of Therapy:  Heparin level 0.3-0.7 units/ml Monitor platelets by anticoagulation protocol: Yes   Plan:  Increase heparin gtt to 1900 units/hr Daily HL and CBC while on heparin.  Leota Sauers Pharm.D. CPP, BCPS Clinical Pharmacist [phone] 05/12/2016 7:54 AM

## 2016-05-13 DIAGNOSIS — R1010 Upper abdominal pain, unspecified: Secondary | ICD-10-CM

## 2016-05-13 DIAGNOSIS — K7200 Acute and subacute hepatic failure without coma: Secondary | ICD-10-CM

## 2016-05-13 DIAGNOSIS — R740 Nonspecific elevation of levels of transaminase and lactic acid dehydrogenase [LDH]: Secondary | ICD-10-CM

## 2016-05-13 DIAGNOSIS — R7401 Elevation of levels of liver transaminase levels: Secondary | ICD-10-CM

## 2016-05-13 LAB — CBC
HEMATOCRIT: 32.1 % — AB (ref 39.0–52.0)
HEMOGLOBIN: 9.6 g/dL — AB (ref 13.0–17.0)
MCH: 23.5 pg — ABNORMAL LOW (ref 26.0–34.0)
MCHC: 29.9 g/dL — ABNORMAL LOW (ref 30.0–36.0)
MCV: 78.5 fL (ref 78.0–100.0)
Platelets: 301 10*3/uL (ref 150–400)
RBC: 4.09 MIL/uL — AB (ref 4.22–5.81)
RDW: 20 % — ABNORMAL HIGH (ref 11.5–15.5)
WBC: 9.9 10*3/uL (ref 4.0–10.5)

## 2016-05-13 LAB — COOXEMETRY PANEL
CARBOXYHEMOGLOBIN: 1.5 % (ref 0.5–1.5)
Methemoglobin: 0.9 % (ref 0.0–1.5)
O2 SAT: 57.3 %
TOTAL HEMOGLOBIN: 11.4 g/dL — AB (ref 12.0–16.0)

## 2016-05-13 LAB — BASIC METABOLIC PANEL
ANION GAP: 5 (ref 5–15)
BUN: 15 mg/dL (ref 6–20)
CHLORIDE: 100 mmol/L — AB (ref 101–111)
CO2: 27 mmol/L (ref 22–32)
Calcium: 7.7 mg/dL — ABNORMAL LOW (ref 8.9–10.3)
Creatinine, Ser: 0.92 mg/dL (ref 0.61–1.24)
GFR calc non Af Amer: >60 mL/min (ref >60)
Glucose, Bld: 133 mg/dL — ABNORMAL HIGH (ref 65–99)
POTASSIUM: 4.2 mmol/L (ref 3.5–5.1)
SODIUM: 132 mmol/L — AB (ref 135–145)

## 2016-05-13 LAB — HEPATITIS PANEL, ACUTE
HCV Ab: 0.2 s/co ratio (ref 0.0–0.9)
HEP B S AG: NEGATIVE
Hep A IgM: NEGATIVE
Hep B C IgM: NEGATIVE

## 2016-05-13 LAB — HEPARIN LEVEL (UNFRACTIONATED): HEPARIN UNFRACTIONATED: 0.3 [IU]/mL (ref 0.30–0.70)

## 2016-05-13 MED ORDER — LOSARTAN POTASSIUM 25 MG PO TABS
25.0000 mg | ORAL_TABLET | Freq: Two times a day (BID) | ORAL | Status: DC
  Administered 2016-05-13 (×2): 25 mg via ORAL
  Filled 2016-05-13 (×2): qty 1

## 2016-05-13 MED ORDER — POTASSIUM CHLORIDE CRYS ER 20 MEQ PO TBCR
40.0000 meq | EXTENDED_RELEASE_TABLET | Freq: Every day | ORAL | Status: DC
  Administered 2016-05-13 – 2016-05-16 (×4): 40 meq via ORAL
  Filled 2016-05-13 (×5): qty 2

## 2016-05-13 NOTE — Progress Notes (Signed)
Initial Nutrition Assessment  DOCUMENTATION CODES:   Not applicable  INTERVENTION:   Low sodium diet education provided  Will provide HS snack.    NUTRITION DIAGNOSIS:   Inadequate oral intake related to  (ETOH abuse, cardiomyopathy) as evidenced by per patient/family report.  GOAL:   Patient will meet greater than or equal to 90% of their needs  MONITOR:   PO intake, I & O's, Weight trends  REASON FOR ASSESSMENT:   Consult Diet education  ASSESSMENT:   Pt with hx of ETOH abuse (drinks 1 case of beer and liquor daily) admitted with profound cardiogenic shock due to biventricular failure. Per heart failure team he is not candidate for transplant, Impella, and note ideal for VAD.    Medications reviewed and include: MVI, folic acid, thiamine, KCl Labs reviewed: Na 132 Pt reports that he had no knowledge of his heart issues before this admission. Reports ETOH use not daily but when he does drink it is a lot, usually on the weekends. He lives with his mom. He gets up at noon gets fast food before work. Usually has a snack at work but lately his intake has been decreased (few months). Reports weight loss over last few months with decreased appetite. Usual weight 175 lb. It is unclear the time frame and it is difficult to determine how much of what he has been eating. He does not usually cook and does mostly fast food.  Pt is hungry and meal completion has been 100% Nutrition-Focused physical exam completed. Findings are no fat depletion, mild muscle depletion in quadriceps, and no edema. Clavicles are visible but per pt this is his norm.  Nail beds are pale.   Education provided, see education record.   Diet Order:  Diet Heart Room service appropriate? Yes; Fluid consistency: Thin  Skin:  Wound (see comment) (hidradenitis suppurativa s/p I&D)  Last BM:  3/12  Height:   Ht Readings from Last 1 Encounters:  05/10/16 6' (1.829 m)    Weight:   Wt Readings from Last 1  Encounters:  05/13/16 149 lb 11.1 oz (67.9 kg)    Ideal Body Weight:  80.9 kg  BMI:  Body mass index is 20.3 kg/m.  Estimated Nutritional Needs:   Kcal:  2100-2300  Protein:  95-115 grams  Fluid:  > 2.1 L/day  EDUCATION NEEDS:   Education needs addressed  Kendell Bane RD, LDN, CNSC [phone] Pager [phone] After Hours Pager

## 2016-05-13 NOTE — Progress Notes (Signed)
Advanced Heart Failure Team Rounding Note   Reason for consult: Cardiogenic shock  Consulting MD: Mcdowell  HPI:    Reportedly admitted for PNA several weeks ago. Since that time has felt very rundown and SOB with any activity. Admitted with HF. Echo LVEF 10% with large apical thrombus. RV moderate to severe HK. Severe TR. Drinks about 1 case of beer per day + liquor. Smokes 0.5-1ppd.   Milrinone turned down yesterday from 0.375 to 0.25. Feels fine. No SOB. Big appetite.  Weight down 15 pounds total. CVP 4  Coox 72% -> 57.3% on milrinone 0.25 mcg/kg/min. K 4.2. Creatinine 0.92  On heparin for LV clot. Hepatitis panels pending. 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 NAD.  HEENT: Normal flat. Carotids 2+ bilat; no bruits. No thyromegaly or nodule noted.  Cor: PMI laterally displaced. Tachy regular + summation gallop. 2/6 TR/MR Lungs: clear Abdomen: soft, NT, ND, no HSM. No bruits or masses. +BS  Extremities: no cyanosis, clubbing, rash. Warm. Multiple tattoos. No edema. Neuro: alert & orientedx3, cranial nerves grossly intact. moves all 4 extremities w/o difficulty. Affect pleasant  Telemetry: Personally reviewed, Sinus tach Sinus tach 110-120  Labs: Basic Metabolic Panel:  Recent Labs Lab 05/10/16 1148 05/10/16 1215 05/11/16 0650 05/11/16 1745  05/12/16 0438 05/13/16 0515  NA 131* 135 132*  --  130* 132*  K 4.6 5.2* 5.0  --  3.3* 4.2  CL 101 102 105  --  97* 100*  CO2 21*  --  19*  --  24 27  GLUCOSE 77 75 92  --  190* 133*  BUN 25* 32* 27*  --  24* 15  CREATININE 1.17 1.10 1.42*  --  1.20 0.92  CALCIUM 8.6*  --  7.8*  --  7.8* 7.7*  MG  --   --   --  1.8  --   --     Liver Function Tests:  Recent Labs Lab 05/10/16 1148 05/11/16 1144  AST 758* 722*  ALT 460* 492*  ALKPHOS 135* 130*  BILITOT 0.9 0.9  PROT 9.3* 8.2*  ALBUMIN 2.7* 2.0*    Recent Labs Lab 05/10/16 1148  LIPASE 15   No results for input(s): AMMONIA in the last 168 hours.  CBC:  Recent Labs Lab 05/10/16 1148 05/10/16 1215 05/11/16 0650 05/12/16 0438 05/13/16 0515  WBC 11.2*  --  9.4 10.4 9.9  NEUTROABS 6.9  --   --   --   --   HGB 9.9* 12.6* 8.5* 9.5* 9.6*  HCT 32.1* 37.0* 28.7* 31.1* 32.1*  MCV 79.9  --  79.5 78.5 78.5  PLT 321  --  280 260 301    Cardiac Enzymes:  Recent Labs Lab 05/10/16 1926 05/11/16 0019 05/11/16 0650  TROPONINI 0.21*  0.22* 0.23*    BNP: BNP (last 3 results)  Recent Labs  05/10/16 1212  BNP 1,543.0*    ProBNP (last 3 results) No results for input(s): PROBNP in the last 8760 hours.   CBG: No results for input(s): GLUCAP in the last 168 hours.  Coagulation Studies: No results for input(s): LABPROT, INR in the last 72 hours.  Other results:   Imaging: No results found.  Scheduled medications . Chlorhexidine Gluconate Cloth  6 each Topical Q0600  . digoxin  0.125 mg Oral Daily  . folic acid  1 mg Oral Daily  . losartan  12.5 mg Oral BID  . mouth rinse  15 mL Mouth Rinse BID  . multivitamin with minerals  1 tablet Oral Daily  . potassium chloride  40 mEq Oral Daily  . sodium chloride flush  10-40 mL Intracatheter Q12H  . sodium chloride flush  10-40 mL Intracatheter Q12H  . sodium chloride flush  3 mL Intravenous Q12H  . spironolactone  25 mg Oral Daily  . thiamine  100 mg Oral  Daily   Infusion meds . heparin 1,900 Units/hr (05/13/16 0543)  . milrinone 0.25 mcg/kg/min (05/12/16 2140)   PRN meds sodium chloride, acetaminophen, LORazepam, ondansetron (ZOFRAN) IV, sodium chloride flush, sodium chloride flush, sodium chloride flush, zolpidem   Assessment:   1. Cardiogenic shock 2. 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 Large LV thrombus 4. Shock liver 5. CKD, 3   --renal u/s shows bilateral medico-renal disease 6. Iron deficient anemia 7. ETOH abuse    --drinks about 1 case beer + liquor daily 8. Ongoing tobacco use     --0.5-1ppd 9. Hydradrenitis suppurtivae    --s/p excision 1/18    --wound care now following  Plan/Discussion:    He was admitted with profound cardiogenic shock due to biventricular failure. Given appearance on echo, calcified LV thrombus and end-organ issues suspect his cardiomyopathy is not recent. Likely ETOH-related (versus viral).   Increase losartan 25 mg BID. Continue Spiro 25 mg daily. Continue digoxin 0.125 mg daily.  Coox 57.3% on milrinone 0.25 mcg/kg/min this am. CVP  4-5 this am. Will keep milrinone at current dose  Plan for R/L cath this week.    Continue heparin for LV thrombus. Discuss risk of CVA from embolic phenomenon.   Blood type O+. Poor candidate for advanced therapies with ETOH, tobacco use, LV clot, and RV dysfunction.   Watch for ETOH withdrawal. Discussed need to avoid ETOH completely. HIV and Hepatitis panels negative.   Wound care following.    Length of Stay: 3 Luane [employer]  05/13/2016, 8:18 AM  Advanced Heart Failure Team Pager [phone] (M-F; 7a - 4p)  Please contact [facility] for night-coverage after hours (4p -7a ) and weekends on [email or web address]   Patient seen and examined with Otilio Saber, PA-C. We discussed all aspects of the encounter. I agree with the assessment and plan as  stated above.   Had diuresed well but still quite tenuous. Co-ox marginal on milrinone 0.25. Very tachy. Will continue milrinone at current dose. Increase losartan. Continue to hold lasix.   Continue heparin for LV clot. Await hepatitis panels. No sign of ETOH withdrawal.   Plan R/L cath later in the week.   Wound care following for post-op gluteal wounds.   Bensimhon, Daniel,MD 3:51 PM

## 2016-05-13 NOTE — Progress Notes (Signed)
ANTICOAGULATION CONSULT NOTE - Follow-up Consult  Pharmacy Consult for heparin Indication: mural thrombus  Allergies  Allergen Reactions  . Bacitracin Rash    Patient Measurements: Height: 6' (182.9 cm) Weight: 149 lb 11.1 oz (67.9 kg) IBW/kg (Calculated) : 77.6 Heparin Dosing Weight: 74 kg  Vital Signs: Temp: 98.5 F (36.9 C) (03/12 0500) Temp Source: Oral (03/12 0500) BP: 128/89 (03/12 0700) Pulse Rate: 118 (03/12 0700)  Labs:  Recent Labs  05/10/16 1926  05/11/16 0019 05/11/16 0650  05/11/16 2241 05/12/16 0438 05/12/16 0439 05/13/16 0515  HGB  --   --   --  8.5*  --   --  9.5*  --  9.6*  HCT  --   --   --  28.7*  --   --  31.1*  --  32.1*  PLT  --   --   --  280  --   --  260  --  301  HEPARINUNFRC  --   < >  --  0.19*  < > 0.43  --  0.35 0.30  CREATININE  --   --   --  1.42*  --   --  1.20  --  0.92  TROPONINI 0.21*  --  0.22* 0.23*  --   --   --   --   --   < > = values in this interval not displayed.  Estimated Creatinine Clearance: 104.6 mL/min (by C-G formula based on SCr of 0.92 mg/dL).  Assessment: 41 yo male on heparin for mural thrombus. Heparin level 0.3 at low end of goal on heparin drip rate 1900 uts/hr.   No issues with line or bleeding reported per RN.  H/H low stable with PLTC wnl.    Goal of Therapy:  Heparin level 0.3-0.7 units/ml Monitor platelets by anticoagulation protocol: Yes   Plan:  Increase heparin gtt to 2000 units/hr Daily HL and CBC while on heparin.  Leota Sauers Pharm.D. CPP, BCPS Clinical Pharmacist [phone] 05/13/2016 8:16 AM

## 2016-05-14 LAB — CBC
HCT: 31 % — ABNORMAL LOW (ref 39.0–52.0)
HEMOGLOBIN: 9.3 g/dL — AB (ref 13.0–17.0)
MCH: 23.5 pg — AB (ref 26.0–34.0)
MCHC: 30 g/dL (ref 30.0–36.0)
MCV: 78.5 fL (ref 78.0–100.0)
Platelets: 278 10*3/uL (ref 150–400)
RBC: 3.95 MIL/uL — AB (ref 4.22–5.81)
RDW: 20 % — ABNORMAL HIGH (ref 11.5–15.5)
WBC: 8.4 10*3/uL (ref 4.0–10.5)

## 2016-05-14 LAB — BASIC METABOLIC PANEL
Anion gap: 4 — ABNORMAL LOW (ref 5–15)
BUN: 12 mg/dL (ref 6–20)
CO2: 25 mmol/L (ref 22–32)
Calcium: 7.5 mg/dL — ABNORMAL LOW (ref 8.9–10.3)
Chloride: 104 mmol/L (ref 101–111)
Creatinine, Ser: 0.85 mg/dL (ref 0.61–1.24)
GLUCOSE: 138 mg/dL — AB (ref 65–99)
Potassium: 3.9 mmol/L (ref 3.5–5.1)
Sodium: 133 mmol/L — ABNORMAL LOW (ref 135–145)

## 2016-05-14 LAB — COOXEMETRY PANEL
CARBOXYHEMOGLOBIN: 1.8 % — AB (ref 0.5–1.5)
Methemoglobin: 1 % (ref 0.0–1.5)
O2 SAT: 65 %
TOTAL HEMOGLOBIN: 8.3 g/dL — AB (ref 12.0–16.0)

## 2016-05-14 LAB — MAGNESIUM: MAGNESIUM: 1.6 mg/dL — AB (ref 1.7–2.4)

## 2016-05-14 LAB — HEPARIN LEVEL (UNFRACTIONATED): Heparin Unfractionated: 0.43 IU/mL (ref 0.30–0.70)

## 2016-05-14 MED ORDER — MAGNESIUM SULFATE 4 GM/100ML IV SOLN
4.0000 g | Freq: Once | INTRAVENOUS | Status: AC
  Administered 2016-05-14: 4 g via INTRAVENOUS
  Filled 2016-05-14: qty 100

## 2016-05-14 MED ORDER — SACUBITRIL-VALSARTAN 24-26 MG PO TABS
1.0000 | ORAL_TABLET | Freq: Two times a day (BID) | ORAL | Status: DC
  Administered 2016-05-14 – 2016-05-17 (×7): 1 via ORAL
  Filled 2016-05-14 (×7): qty 1

## 2016-05-14 NOTE — Progress Notes (Signed)
Advanced Heart Failure Team Rounding Note   Reason for consult: Cardiogenic shock  Consulting MD: Mcdowell  HPI:    Reportedly admitted for PNA several weeks ago. Since that time has felt very rundown and SOB with any activity. Admitted with HF. Echo LVEF 10% with large apical thrombus. RV moderate to severe HK. Severe TR. Drinks about 1 case of beer per day + liquor. Smokes 0.5-1ppd.  Coox 65% on milrinone 0.125 mcg/kg/min this am. K 3.9. Creatinine 0.85.   Denies SOB or CP. Occasionally having tachypalpitations. Nervous about LV clot.  Says that he is "done" with alcohol.   Out 800 cc. Weight unchanged. Pressures stable with medication adjustment.  On heparin for LV clot. Hepatitis panels negative. 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 NAD.   HEENT: Normal flat. Carotids 2+ bilat; no bruits. No thyromegaly or nodule noted.  Cor: PMI laterally displaced. Regular. Tachy. + summation gallop. 2/6 TR/MR Lungs: CTAB, normal effort.  Abdomen: soft, NT, ND, no HSM. No bruits or masses. +BS  Extremities: no cyanosis, clubbing, rash. Warm. Multiple tattoos. No edema. Neuro: alert & orientedx3, cranial nerves grossly intact. moves all 4 extremities w/o difficulty. Affect pleasant  Telemetry: Reviewed personally, remains in sinus tach 100-120s.   Labs: Basic  Metabolic Panel:  Recent Labs Lab 05/10/16 1148 05/10/16 1215 05/11/16 0650 05/11/16 1745 05/12/16 0438 05/13/16 0515 05/14/16 0502  NA 131* 135 132*  --  130* 132* 133*  K 4.6 5.2* 5.0  --  3.3* 4.2 3.9  CL 101 102 105  --  97* 100* 104  CO2 21*  --  19*  --  24 27 25   GLUCOSE 77 75 92  --  190* 133* 138*  BUN 25* 32* 27*  --  24* 15 12  CREATININE 1.17 1.10 1.42*  --  1.20 0.92 0.85  CALCIUM 8.6*  --  7.8*  --  7.8* 7.7* 7.5*  MG  --   --   --  1.8  --   --  1.6*    Liver Function Tests:  Recent Labs Lab 05/10/16 1148 05/11/16 1144  AST 758* 722*  ALT 460* 492*  ALKPHOS 135* 130*  BILITOT 0.9 0.9  PROT 9.3* 8.2*  ALBUMIN 2.7* 2.0*    Recent Labs Lab 05/10/16 1148  LIPASE 15   No results for input(s): AMMONIA in the last 168 hours.  CBC:  Recent Labs Lab 05/10/16 1148 05/10/16 1215 05/11/16 0650 05/12/16 0438 05/13/16 0515 05/14/16 0502  WBC 11.2*  --  9.4 10.4 9.9 8.4  NEUTROABS 6.9  --   --   --   --   --   HGB 9.9* 12.6* 8.5* 9.5* 9.6* 9.3*  HCT 32.1* 37.0* 28.7* 31.1* 32.1* 31.0*  MCV  79.9  --  79.5 78.5 78.5 78.5  PLT 321  --  280 260 301 278    Cardiac Enzymes:  Recent Labs Lab 05/10/16 1926 05/11/16 0019 05/11/16 0650  TROPONINI 0.21* 0.22* 0.23*    BNP: BNP (last 3 results)  Recent Labs  05/10/16 1212  BNP 1,543.0*    ProBNP (last 3 results) No results for input(s): PROBNP in the last 8760 hours.   CBG: No results for input(s): GLUCAP in the last 168 hours.  Coagulation Studies: No results for input(s): LABPROT, INR in the last 72 hours.  Other results:   Imaging: No results found.  Scheduled medications . Chlorhexidine Gluconate Cloth  6 each Topical Q0600  . digoxin  0.125 mg Oral Daily  . folic acid  1 mg Oral Daily  . losartan  25 mg Oral BID  . mouth rinse  15 mL Mouth Rinse BID  . multivitamin with minerals  1 tablet Oral Daily  . potassium chloride  40 mEq Oral Daily  . sodium chloride flush  3 mL  Intravenous Q12H  . spironolactone  25 mg Oral Daily  . thiamine  100 mg Oral Daily   Infusion meds . heparin 2,000 Units/hr (05/13/16 2004)  . milrinone 0.25 mcg/kg/min (05/13/16 1900)   PRN meds sodium chloride, acetaminophen, LORazepam, ondansetron (ZOFRAN) IV, sodium chloride flush, sodium chloride flush, zolpidem   Assessment:   1. Cardiogenic shock 2. 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 Large LV thrombus 4. Shock liver 5. CKD, 3   --renal u/s shows bilateral medico-renal disease 6. Iron deficient anemia 7. ETOH abuse    -- had been drinking about 1 case beer + liquor daily 8. Ongoing tobacco use     --0.5-1ppd 9. Hydradrenitis suppurtivae    --s/p excision 1/18    --wound care now following  Plan/Discussion:    He was admitted with profound cardiogenic shock due to biventricular failure. Given appearance on echo, calcified LV thrombus and end-organ issues suspect his cardiomyopathy is not recent. Likely ETOH-related (versus viral).   Continue losartan 25 mg BID. Consider switch to [facility].   Continue Spiro 25 mg daily. Continue digoxin 0.125 mg daily.  Coox 65% on milrinone 0.25 mcg/kg/min. CVP ~7 cm. Will wean milrinone to 0.125 mcg/kg/min and follow coox.   Holding off on BB with low output. Hesitant to use corlanor with low output and compensatory tachycardia.   Plan for R/L cath this week.    Continue heparin for LV thrombus. Discuss risk of CVA from embolic phenomenon.   Blood type O+. Poor candidate for advanced therapies with ETOH, tobacco use, LV clot, and RV dysfunction.   No signs of ETOH withdrawal. Needs to avoid completely. HIV and Hepatitis panels negative.   Wound care following for gluteal wounds.  Length of Stay: 4 [employer]  05/14/2016, 7:52 AM  Advanced Heart Failure Team Pager [phone] (M-F; 7a - 4p)  Please contact [facility] for night-coverage after hours (4p -7a ) and weekends on [email or web address]  Patient seen and examined with Otilio Saber, PA-C. We discussed all aspects of the encounter. I agree with the assessment and plan as stated above.   Improving slowly but still tenuous. Co-ox improved. Will cut milrinone to 0.125. Switch losartan to Entresto 24/26. Continue spiro and digoxin. Will likely add ivabradine tomorrow.   Follow CVP. If climbing start lasix 40 po  bid.   Plan R/L cath likely tomorrow or Thursday. I would prefer to do it off milrinone.   Continue heparin for LV clot. Start coumadin tomorrow.   Hepatitis panels negative.   Dalten Ambrosino,MD 9:40 AM

## 2016-05-14 NOTE — Progress Notes (Signed)
Left Release of Medical Record Consent with bedside nurse as requested by patient to obtain medical records needed by [facility] for Short Term Disability. Patient requested to nap and not be disturbed.

## 2016-05-14 NOTE — Progress Notes (Signed)
ANTICOAGULATION CONSULT NOTE - Follow-up Consult  Pharmacy Consult for heparin Indication: mural thrombus  Allergies  Allergen Reactions  . Bacitracin Rash    Patient Measurements: Height: 6' (182.9 cm) Weight: 149 lb 11.1 oz (67.9 kg) IBW/kg (Calculated) : 77.6 Heparin Dosing Weight: 74 kg  Vital Signs: Temp: 98.3 F (36.8 C) (03/13 0800) Temp Source: Oral (03/13 0800) BP: 122/90 (03/13 0800) Pulse Rate: 119 (03/13 0800)  Labs:  Recent Labs  05/12/16 0438 05/12/16 0439 05/13/16 0515 05/14/16 0502  HGB 9.5*  --  9.6* 9.3*  HCT 31.1*  --  32.1* 31.0*  PLT 260  --  301 278  HEPARINUNFRC  --  0.35 0.30 0.43  CREATININE 1.20  --  0.92 0.85    Estimated Creatinine Clearance: 113.2 mL/min (by C-G formula based on SCr of 0.85 mg/dL).  Assessment: 41 yo male on heparin for mural thrombus. Heparin level 0.43 at  goal on heparin drip rate 2000 uts/hr.   No issues with line or bleeding reported per RN.  H/H low stable with PLTC wnl.    Goal of Therapy:  Heparin level 0.3-0.7 units/ml Monitor platelets by anticoagulation protocol: Yes   Plan:  Increase heparin gtt to 2000 units/hr Daily HL and CBC while on heparin Follow up oral [facility] plan after cath lab this week  Leota Sauers Pharm.D. CPP, BCPS Clinical Pharmacist [phone] 05/14/2016 8:12 AM

## 2016-05-14 NOTE — Progress Notes (Addendum)
Benefit check requested for: corlanor 5mg  BID  Will post results when available.   Nia A Shealy  Lawerance Sabal, RN        Prior Berkley Harvey is required [phone]- 30 day supply copay will be $30, 90 supply copay will be $75    15:39, Notified Graciella Freer, PA-C of prior auth needed and copay.

## 2016-05-15 LAB — BASIC METABOLIC PANEL WITH GFR
Anion gap: 8 (ref 5–15)
BUN: 11 mg/dL (ref 6–20)
CO2: 24 mmol/L (ref 22–32)
Calcium: 8.2 mg/dL — ABNORMAL LOW (ref 8.9–10.3)
Chloride: 100 mmol/L — ABNORMAL LOW (ref 101–111)
Creatinine, Ser: 0.99 mg/dL (ref 0.61–1.24)
GFR calc Af Amer: >60 mL/min
GFR calc non Af Amer: >60 mL/min
Glucose, Bld: 145 mg/dL — ABNORMAL HIGH (ref 65–99)
Potassium: 4.3 mmol/L (ref 3.5–5.1)
Sodium: 132 mmol/L — ABNORMAL LOW (ref 135–145)

## 2016-05-15 LAB — PROTIME-INR
INR: 1.23
PROTHROMBIN TIME: 15.6 s — AB (ref 11.4–15.2)

## 2016-05-15 LAB — CBC
HCT: 33.8 % — ABNORMAL LOW (ref 39.0–52.0)
Hemoglobin: 10.2 g/dL — ABNORMAL LOW (ref 13.0–17.0)
MCH: 23.9 pg — ABNORMAL LOW (ref 26.0–34.0)
MCHC: 30.2 g/dL (ref 30.0–36.0)
MCV: 79.2 fL (ref 78.0–100.0)
Platelets: 338 10*3/uL (ref 150–400)
RBC: 4.27 MIL/uL (ref 4.22–5.81)
RDW: 20.1 % — ABNORMAL HIGH (ref 11.5–15.5)
WBC: 9.9 10*3/uL (ref 4.0–10.5)

## 2016-05-15 LAB — CULTURE, BLOOD (ROUTINE X 2)
CULTURE: NO GROWTH
Culture: NO GROWTH

## 2016-05-15 LAB — COOXEMETRY PANEL
Carboxyhemoglobin: 1.9 % — ABNORMAL HIGH (ref 0.5–1.5)
Methemoglobin: 0.7 % (ref 0.0–1.5)
O2 Saturation: 60.3 %
Total hemoglobin: 10.9 g/dL — ABNORMAL LOW (ref 12.0–16.0)

## 2016-05-15 LAB — HEPARIN LEVEL (UNFRACTIONATED): HEPARIN UNFRACTIONATED: 0.61 [IU]/mL (ref 0.30–0.70)

## 2016-05-15 LAB — MAGNESIUM: Magnesium: 2 mg/dL (ref 1.7–2.4)

## 2016-05-15 MED ORDER — WARFARIN VIDEO
Freq: Once | Status: AC
  Administered 2016-05-15: 16:00:00

## 2016-05-15 MED ORDER — WARFARIN SODIUM 7.5 MG PO TABS
7.5000 mg | ORAL_TABLET | Freq: Once | ORAL | Status: AC
  Administered 2016-05-15: 7.5 mg via ORAL
  Filled 2016-05-15: qty 1

## 2016-05-15 MED ORDER — COUMADIN BOOK
Freq: Once | Status: AC
  Administered 2016-05-15: 16:00:00
  Filled 2016-05-15: qty 1

## 2016-05-15 MED ORDER — HEPARIN (PORCINE) IN NACL 100-0.45 UNIT/ML-% IJ SOLN
1950.0000 [IU]/h | INTRAMUSCULAR | Status: DC
  Administered 2016-05-16: 1950 [IU]/h via INTRAVENOUS
  Filled 2016-05-15: qty 250

## 2016-05-15 MED ORDER — IVABRADINE HCL 5 MG PO TABS
2.5000 mg | ORAL_TABLET | Freq: Two times a day (BID) | ORAL | Status: DC
  Administered 2016-05-15 – 2016-05-17 (×4): 2.5 mg via ORAL
  Filled 2016-05-15 (×6): qty 1

## 2016-05-15 MED ORDER — WARFARIN - PHARMACIST DOSING INPATIENT
Freq: Every day | Status: DC
Start: 2016-05-15 — End: 2016-05-18
  Administered 2016-05-16 – 2016-05-17 (×2)

## 2016-05-15 NOTE — Plan of Care (Signed)
Problem: Activity: Goal: Risk for activity intolerance will decrease Outcome: Progressing Pt ambulating more frequently in the hall, while doing so denies SOB or increased weakness

## 2016-05-15 NOTE — Progress Notes (Signed)
Advanced Heart Failure Team Rounding Note   Reason for consult: Cardiogenic shock  Consulting MD: Mcdowell  HPI:    Reportedly admitted for PNA several weeks ago. Since that time has felt very rundown and SOB with any activity. Admitted with HF. Echo LVEF 10% with large apical thrombus. RV moderate to severe HK. Severe TR. Drinks about 1 case of beer per day + liquor. Smokes 0.5-1ppd.  Coox 60.3% on 0.125 milrinone this am. K 4.3. Creatinine 0.99  CVP 4-5 this am.   Feeling good this morning. Anxious about future.  States he walked for around an hour without any SOB.    + 160 cc. Weight stable.   On heparin for LV clot. Hepatitis panels negative. Blood type O positive   Wound care has seen for surgery in January 1/18 for gluteal hydradrenitris suppurtivae  Objective:    Vital Signs:   Temp:  [98.1 F (36.7 C)-99 F (37.2 C)] 98.7 F (37.1 C) (03/14 0505) Pulse Rate:  [119-120] 120 (03/14 0505) Resp:  [17-30] 24 (03/14 0700) BP: (110-131)/(79-103) 115/89 (03/14 0700) SpO2:  [98 %-99 %] 98 % (03/14 0505) Weight:  [149 lb 7.6 oz (67.8 kg)] 149 lb 7.6 oz (67.8 kg) (03/14 0500) Last BM Date: 05/14/16 Filed Weights   05/13/16 0500 05/14/16 0500 05/15/16 0500  Weight: 149 lb 11.1 oz (67.9 kg) 149 lb 11.1 oz (67.9 kg) 149 lb 7.6 oz (67.8 kg)    Physical Exam: CVP 4-5 this am.  General:  Lying in bed. NAD.    HEENT: Normal flat. Carotids 2+ bilat; no bruits. No thyromegaly or nodule noted.  Cor: PMI laterally displaced. Regular. Tachy. + summation gallop. 2/6 TR/MR Lungs: Mildly diminished basilar sounds.  Abdomen: soft, NT, ND, no HSM. No bruits or masses. +BS  Extremities: no cyanosis, clubbing, rash. Warm. Multiple tattoos. No peripheral edema. Neuro: alert & orientedx3, cranial nerves grossly intact. moves all 4 extremities w/o difficulty. Affect pleasant  Telemetry: Personally reviewed, Sinus tach 110-120s     Labs: Basic Metabolic Panel:  Recent Labs Lab  05/11/16 0650 05/11/16 1745 05/12/16 0438 05/13/16 0515 05/14/16 0502 05/15/16 0513  NA 132*  --  130* 132* 133* 132*  K 5.0  --  3.3* 4.2 3.9 4.3  CL 105  --  97* 100* 104 100*  CO2 19*  --  24 27 25 24   GLUCOSE 92  --  190* 133* 138* 145*  BUN 27*  --  24* 15 12 11   CREATININE 1.42*  --  1.20 0.92 0.85 0.99  CALCIUM 7.8*  --  7.8* 7.7* 7.5* 8.2*  MG  --  1.8  --   --  1.6* 2.0    Liver Function Tests:  Recent Labs Lab 05/10/16 1148 05/11/16 1144  AST 758* 722*  ALT 460* 492*  ALKPHOS 135* 130*  BILITOT 0.9 0.9  PROT 9.3* 8.2*  ALBUMIN 2.7* 2.0*    Recent Labs Lab 05/10/16 1148  LIPASE 15   No results for input(s): AMMONIA in the last 168 hours.  CBC:  Recent Labs Lab 05/10/16 1148  05/11/16 0650 05/12/16 0438 05/13/16 0515 05/14/16 0502 05/15/16 0513  WBC 11.2*  --  9.4 10.4 9.9 8.4 9.9  NEUTROABS 6.9  --   --   --   --   --   --   HGB 9.9*  < > 8.5* 9.5* 9.6* 9.3* 10.2*  HCT 32.1*  < > 28.7* 31.1* 32.1* 31.0* 33.8*  MCV 79.9  --  79.5 78.5 78.5 78.5 79.2  PLT 321  --  280 260 301 278 338  < > = values in this interval not displayed.  Cardiac Enzymes:  Recent Labs Lab 05/10/16 1926 05/11/16 0019 05/11/16 0650  TROPONINI 0.21* 0.22* 0.23*    BNP: BNP (last 3 results)  Recent Labs  05/10/16 1212  BNP 1,543.0*    ProBNP (last 3 results) No results for input(s): PROBNP in the last 8760 hours.   CBG: No results for input(s): GLUCAP in the last 168 hours.  Coagulation Studies: No results for input(s): LABPROT, INR in the last 72 hours.  Other results:   Imaging: No results found.  Scheduled medications . Chlorhexidine Gluconate Cloth  6 each Topical Q0600  . digoxin  0.125 mg Oral Daily  . folic acid  1 mg Oral Daily  . mouth rinse  15 mL Mouth Rinse BID  . multivitamin with minerals  1 tablet Oral Daily  . potassium chloride  40 mEq Oral Daily  . sacubitril-valsartan  1 tablet Oral BID  . sodium chloride flush  3 mL  Intravenous Q12H  . spironolactone  25 mg Oral Daily  . thiamine  100 mg Oral Daily   Infusion meds . heparin 2,000 Units/hr (05/15/16 0009)  . milrinone 0.125 mcg/kg/min (05/14/16 1900)   PRN meds sodium chloride, acetaminophen, LORazepam, ondansetron (ZOFRAN) IV, sodium chloride flush, sodium chloride flush, zolpidem   Assessment:   1. Cardiogenic shock 2. 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 Large LV thrombus 4. Shock liver 5. CKD, 3   --renal u/s shows bilateral medico-renal disease 6. Iron deficient anemia 7. ETOH abuse    -- had been drinking about 1 case beer + liquor daily 8. Ongoing tobacco use     --0.5-1ppd 9. Hydradrenitis suppurtivae    --s/p excision 1/18    --wound care now following  Plan/Discussion:    He was admitted with profound cardiogenic shock due to biventricular failure. Given appearance on echo, calcified LV thrombus and end-organ issues suspect his cardiomyopathy is not recent. Likely ETOH-related (versus viral).   Continue Entresto 24/26 mg BID.    Continue Spiro 25 mg daily. Continue digoxin 0.125 mg daily.  Coox 60.3% on milrinone 0.125 mcg/kg/min.  Would like off for RHC.  Will stop this am and discuss cath timing with MD.   CVP 4-5 this am. Continue to follow for need lasix.   No BB with marginal output and negative inotropy.  Will consider corlanor.   Continue heparin for LV thrombus. Discuss risk of CVA from embolic phenomenon.   Blood type O+. Poor candidate for advanced therapies with ETOH, tobacco use, LV clot, and RV dysfunction. No change.   Continue to follow for symptoms of ETOH withdrawal. Needs to stop completely. HIV/Hep panels negative.    Wound care following for gluteal wounds.  Length of Stay: [address] [employer]  05/15/2016, 7:40 AM  Advanced Heart Failure Team Pager [phone] (M-F; 7a - 4p)  Please contact  [facility] for night-coverage after hours (4p -7a ) and weekends on [email or web address]  Patient seen and examined with Otilio Saber, PA-C. We discussed all aspects of the encounter. I agree with the assessment and plan as stated above.   Co-ox marginal but stable on low-dose milrinone. Will stop milrinone today. CVP remains low. Will hold lasix one more day.   Plan R/L heart cath  tomorrow. Procedure d/w him and his family.   Continue heparin for LV clot. Start warfarin.   Will add corlanor.   Continue to mobilize.   Bensimhon, Daniel,MD 6:00 PM

## 2016-05-15 NOTE — Consult Note (Signed)
WOC Nurse wound consult note Reason for Consult: head lesion Patient with history of hydradenitis suppurativa, patient reports several surgical procedures for this problem.  He has a new area on his head, occipital region.  He reports surgical intervention on this same area several years ago but is unable to tell me exactly when.  Minimal drainage on the pillow case, appears serosanguinous.   Wound type: draining lesion related to hydradenitis Measurement:small area noted, open 0.2cm x 0.2cm x 0.1cm Wound ZOX:WRUEAV to visualize  Drainage (amount, consistency, odor) see above Periwound: hair Dressing procedure/placement/frequency: I am not able to really recommend topical care due to location.  I asked him what he did for this area before, he said "what do you mean". What did you put on the area. He reports that he had the surgery and then "grew is hair to cover it up"  He has thick dread like hair and it is not going to be feasible to put any dressings on his head unless we shave his hair, he does not want to do this.  If becomes bothersome or a concern for a source of infection surgery team may need to evaluate the area.  Discussed POC with patient and bedside nurse.  Re consult if needed, will not follow at this time. Thanks  Porchea Charrier [employer], RN,CWOCN, CNS [phone])

## 2016-05-15 NOTE — Progress Notes (Signed)
ANTICOAGULATION CONSULT NOTE - Follow Up Consult  Pharmacy Consult for Heparin; add Coumadin Indication: mural thrombus  Allergies  Allergen Reactions  . Bacitracin Rash    Patient Measurements: Height: 6' (182.9 cm) Weight: 149 lb 7.6 oz (67.8 kg) IBW/kg (Calculated) : 77.6  Vital Signs: Temp: 98.2 F (36.8 C) (03/14 1246) Temp Source: Oral (03/14 1246) BP: 115/90 (03/14 1246) Pulse Rate: 113 (03/14 1246)  Labs:  Recent Labs  05/13/16 0515 05/14/16 0502 05/15/16 0513  HGB 9.6* 9.3* 10.2*  HCT 32.1* 31.0* 33.8*  PLT 301 278 338  HEPARINUNFRC 0.30 0.43 0.61  CREATININE 0.92 0.85 0.99    Estimated Creatinine Clearance: 97 mL/min (by C-G formula based on SCr of 0.99 mg/dL).   Medications:  Heparin @ 2000 units/hr  Assessment: 40yom continues on heparin for mural thrombus. Heparin level therapeutic at 0.61 but trending up. Will add coumadin today. Coumadin score is 10. Baseline INR pending. LFTs were elevated but trending down. No drug interactions identified.  Goal of Therapy:  INR 2-3 Monitor platelets by anticoagulation protocol: Yes   Plan:  1) Decrease heparin to 1950 units/hr 2) Coumadin 7.5mg  x 1 3) Coumadin education - book/video 4) Daily INR, heparin level, CBC  Fredrik Rigger 05/15/2016,2:20 PM

## 2016-05-16 ENCOUNTER — Telehealth (HOSPITAL_COMMUNITY): Payer: Self-pay

## 2016-05-16 ENCOUNTER — Encounter (HOSPITAL_COMMUNITY)
Admission: EM | Disposition: A | Discharge status: Home / Self Care | Payer: Self-pay | Source: Home / Self Care | Attending: Internal Medicine

## 2016-05-16 HISTORY — PX: RIGHT/LEFT HEART CATH AND CORONARY ANGIOGRAPHY: CATH118266

## 2016-05-16 LAB — HEPARIN LEVEL (UNFRACTIONATED): HEPARIN UNFRACTIONATED: 0.37 [IU]/mL (ref 0.30–0.70)

## 2016-05-16 LAB — POCT I-STAT 3, VENOUS BLOOD GAS (G3P V)
ACID-BASE DEFICIT: 1 mmol/L (ref 0.0–2.0)
Acid-base deficit: 3 mmol/L — ABNORMAL HIGH (ref 0.0–2.0)
BICARBONATE: 23.7 mmol/L (ref 20.0–28.0)
Bicarbonate: 21.5 mmol/L (ref 20.0–28.0)
O2 Saturation: 55 %
O2 Saturation: 56 %
PCO2 VEN: 37 mmHg — AB (ref 44.0–60.0)
PH VEN: 7.371 (ref 7.250–7.430)
PH VEN: 7.376 (ref 7.250–7.430)
PO2 VEN: 30 mmHg — AB (ref 32.0–45.0)
PO2 VEN: 30 mmHg — AB (ref 32.0–45.0)
TCO2: 23 mmol/L (ref 0–100)
TCO2: 25 mmol/L (ref 0–100)
pCO2, Ven: 40.4 mmHg — ABNORMAL LOW (ref 44.0–60.0)

## 2016-05-16 LAB — CBC
HCT: 33.3 % — ABNORMAL LOW (ref 39.0–52.0)
HEMOGLOBIN: 9.8 g/dL — AB (ref 13.0–17.0)
MCH: 23.4 pg — ABNORMAL LOW (ref 26.0–34.0)
MCHC: 29.4 g/dL — AB (ref 30.0–36.0)
MCV: 79.5 fL (ref 78.0–100.0)
Platelets: 323 10*3/uL (ref 150–400)
RBC: 4.19 MIL/uL — ABNORMAL LOW (ref 4.22–5.81)
RDW: 20 % — ABNORMAL HIGH (ref 11.5–15.5)
WBC: 10 10*3/uL (ref 4.0–10.5)

## 2016-05-16 LAB — POCT I-STAT 3, ART BLOOD GAS (G3+)
Acid-base deficit: 1 mmol/L (ref 0.0–2.0)
Bicarbonate: 22.7 mmol/L (ref 20.0–28.0)
O2 SAT: 95 %
PCO2 ART: 34.3 mmHg (ref 32.0–48.0)
TCO2: 24 mmol/L (ref 0–100)
pH, Arterial: 7.429 (ref 7.350–7.450)
pO2, Arterial: 76 mmHg — ABNORMAL LOW (ref 83.0–108.0)

## 2016-05-16 LAB — COMPREHENSIVE METABOLIC PANEL
ALT: 160 U/L — AB (ref 17–63)
AST: 104 U/L — ABNORMAL HIGH (ref 15–41)
Albumin: 2.4 g/dL — ABNORMAL LOW (ref 3.5–5.0)
Alkaline Phosphatase: 101 U/L (ref 38–126)
Anion gap: 7 (ref 5–15)
BILIRUBIN TOTAL: 0.6 mg/dL (ref 0.3–1.2)
BUN: 17 mg/dL (ref 6–20)
CALCIUM: 8.3 mg/dL — AB (ref 8.9–10.3)
CHLORIDE: 101 mmol/L (ref 101–111)
CO2: 26 mmol/L (ref 22–32)
Creatinine, Ser: 1.21 mg/dL (ref 0.61–1.24)
Glucose, Bld: 113 mg/dL — ABNORMAL HIGH (ref 65–99)
Potassium: 4.4 mmol/L (ref 3.5–5.1)
SODIUM: 134 mmol/L — AB (ref 135–145)
TOTAL PROTEIN: 8.9 g/dL — AB (ref 6.5–8.1)

## 2016-05-16 LAB — COOXEMETRY PANEL
CARBOXYHEMOGLOBIN: 1.4 % (ref 0.5–1.5)
METHEMOGLOBIN: 1.1 % (ref 0.0–1.5)
O2 SAT: 62 %
Total hemoglobin: 10.2 g/dL — ABNORMAL LOW (ref 12.0–16.0)

## 2016-05-16 LAB — PROTIME-INR
INR: 1.18
PROTHROMBIN TIME: 15.1 s (ref 11.4–15.2)

## 2016-05-16 SURGERY — RIGHT/LEFT HEART CATH AND CORONARY ANGIOGRAPHY
Anesthesia: LOCAL

## 2016-05-16 MED ORDER — ASPIRIN 81 MG PO CHEW
81.0000 mg | CHEWABLE_TABLET | ORAL | Status: AC
  Administered 2016-05-16: 81 mg via ORAL
  Filled 2016-05-16: qty 1

## 2016-05-16 MED ORDER — MIDAZOLAM HCL 2 MG/2ML IJ SOLN
INTRAMUSCULAR | Status: AC
  Filled 2016-05-16: qty 2

## 2016-05-16 MED ORDER — HEPARIN (PORCINE) IN NACL 2-0.9 UNIT/ML-% IJ SOLN
INTRAMUSCULAR | Status: DC | PRN
  Administered 2016-05-16: 1000 mL

## 2016-05-16 MED ORDER — ONDANSETRON HCL 4 MG/2ML IJ SOLN: 4.0000 mg | Freq: Four times a day (QID) | INTRAMUSCULAR | Status: DC | PRN

## 2016-05-16 MED ORDER — SODIUM CHLORIDE 0.9 % IV SOLN: INTRAVENOUS | Status: DC

## 2016-05-16 MED ORDER — MIDAZOLAM HCL 2 MG/2ML IJ SOLN
INTRAMUSCULAR | Status: DC | PRN
  Administered 2016-05-16: 1 mg via INTRAVENOUS

## 2016-05-16 MED ORDER — LIDOCAINE HCL (PF) 1 % IJ SOLN
INTRAMUSCULAR | Status: AC
  Filled 2016-05-16: qty 30

## 2016-05-16 MED ORDER — BENZONATATE 100 MG PO CAPS
100.0000 mg | ORAL_CAPSULE | Freq: Three times a day (TID) | ORAL | Status: DC | PRN
  Administered 2016-05-17 – 2016-05-18 (×2): 100 mg via ORAL
  Filled 2016-05-16 (×2): qty 1

## 2016-05-16 MED ORDER — FENTANYL CITRATE (PF) 100 MCG/2ML IJ SOLN
INTRAMUSCULAR | Status: DC | PRN
  Administered 2016-05-16: 50 ug via INTRAVENOUS

## 2016-05-16 MED ORDER — HEPARIN (PORCINE) IN NACL 2-0.9 UNIT/ML-% IJ SOLN
INTRAMUSCULAR | Status: DC | PRN
  Administered 2016-05-16: 16:00:00 via INTRA_ARTERIAL

## 2016-05-16 MED ORDER — SODIUM CHLORIDE 0.9% FLUSH: 3.0000 mL | INTRAVENOUS | Status: DC | PRN

## 2016-05-16 MED ORDER — HEPARIN (PORCINE) IN NACL 100-0.45 UNIT/ML-% IJ SOLN
1950.0000 [IU]/h | INTRAMUSCULAR | Status: DC
  Filled 2016-05-16: qty 250

## 2016-05-16 MED ORDER — SODIUM CHLORIDE 0.9 % IV SOLN: 250.0000 mL | INTRAVENOUS | Status: DC | PRN

## 2016-05-16 MED ORDER — HEPARIN SODIUM (PORCINE) 1000 UNIT/ML IJ SOLN
INTRAMUSCULAR | Status: AC
  Filled 2016-05-16: qty 1

## 2016-05-16 MED ORDER — IOPAMIDOL (ISOVUE-370) INJECTION 76%
INTRAVENOUS | Status: AC
  Filled 2016-05-16: qty 100

## 2016-05-16 MED ORDER — WARFARIN SODIUM 7.5 MG PO TABS
7.5000 mg | ORAL_TABLET | Freq: Once | ORAL | Status: AC
  Administered 2016-05-16: 7.5 mg via ORAL
  Filled 2016-05-16: qty 1

## 2016-05-16 MED ORDER — HEPARIN (PORCINE) IN NACL 2-0.9 UNIT/ML-% IJ SOLN
INTRAMUSCULAR | Status: AC
Start: 2016-05-16 — End: 2016-05-16
  Filled 2016-05-16: qty 1000

## 2016-05-16 MED ORDER — ACETAMINOPHEN 325 MG PO TABS: 650.0000 mg | ORAL_TABLET | ORAL | Status: DC | PRN

## 2016-05-16 MED ORDER — SODIUM CHLORIDE 0.9 % IV SOLN
INTRAVENOUS | Status: AC
  Administered 2016-05-16: 17:00:00 via INTRAVENOUS

## 2016-05-16 MED ORDER — HEPARIN SODIUM (PORCINE) 1000 UNIT/ML IJ SOLN
INTRAMUSCULAR | Status: DC | PRN
  Administered 2016-05-16: 3500 [IU] via INTRAVENOUS

## 2016-05-16 MED ORDER — SODIUM CHLORIDE 0.9% FLUSH
3.0000 mL | Freq: Two times a day (BID) | INTRAVENOUS | Status: DC
  Administered 2016-05-17: 3 mL via INTRAVENOUS

## 2016-05-16 MED ORDER — FENTANYL CITRATE (PF) 100 MCG/2ML IJ SOLN
INTRAMUSCULAR | Status: AC
  Filled 2016-05-16: qty 2

## 2016-05-16 MED ORDER — SODIUM CHLORIDE 0.9% FLUSH
3.0000 mL | Freq: Two times a day (BID) | INTRAVENOUS | Status: DC
  Administered 2016-05-16: 3 mL via INTRAVENOUS

## 2016-05-16 MED ORDER — LIDOCAINE HCL (PF) 1 % IJ SOLN
INTRAMUSCULAR | Status: DC | PRN
  Administered 2016-05-16 (×2): 1 mL via SUBCUTANEOUS

## 2016-05-16 MED ORDER — VERAPAMIL HCL 2.5 MG/ML IV SOLN
INTRAVENOUS | Status: AC
  Filled 2016-05-16: qty 2

## 2016-05-16 SURGICAL SUPPLY — 11 items
CATH 5FR JL3.5 JR4 ANG PIG MP (CATHETERS) ×2 IMPLANT
CATH BALLN WEDGE 5F 110CM (CATHETERS) ×2 IMPLANT
DEVICE RAD COMP TR BAND LRG (VASCULAR PRODUCTS) ×2 IMPLANT
GLIDESHEATH SLEND SS 6F .021 (SHEATH) ×2 IMPLANT
GUIDEWIRE .025 260CM (WIRE) ×2 IMPLANT
KIT HEART LEFT (KITS) ×2 IMPLANT
PACK CARDIAC CATHETERIZATION (CUSTOM PROCEDURE TRAY) ×2 IMPLANT
SHEATH GLIDE SLENDER 4/5FR (SHEATH) ×2 IMPLANT
TRANSDUCER W/STOPCOCK (MISCELLANEOUS) ×2 IMPLANT
TUBING CIL FLEX 10 FLL-RA (TUBING) ×2 IMPLANT
WIRE HI TORQ VERSACORE-J 145CM (WIRE) ×2 IMPLANT

## 2016-05-16 NOTE — Telephone Encounter (Signed)
Pharmacy Medication-Related Note  PA for Corlandor 5mg  (0.5 tab) approved by [employer] through 05/16/2017.  Allie Bossier, PharmD PGY1 Pharmacy Resident [phone] (Pager) 05/16/2016 10:31 AM

## 2016-05-16 NOTE — H&P (View-Only) (Signed)
Advanced Heart Failure Team Rounding Note   Reason for consult: Cardiogenic shock  Consulting MD: Mcdowell  HPI:    Reportedly admitted for PNA several weeks ago. Since that time has felt very rundown and SOB with any activity. Admitted with HF. Echo LVEF 10% with large apical thrombus. RV moderate to severe HK. Severe TR. Drinks about 1 case of beer per day + liquor. Smokes 0.5-1ppd.  Coox 62.0% this am OFF milrinone. K 4.4. Creatinine 0.99 CVP 4-5   Main complaint is hunger this morning. Still having occasional dry cough. No CP. Ambulating halls without difficulty.   Out 800 cc yesterday. Weight stable without diuretics.   On heparin for LV clot. Hepatitis panels negative. Blood type O positive   Wound care has seen for surgery in January 1/18 for gluteal hydradrenitris suppurtivae  Objective:    Vital Signs:   Temp:  [97.2 F (36.2 C)-98.5 F (36.9 C)] 97.2 F (36.2 C) (03/15 0827) Pulse Rate:  [101-113] 112 (03/15 0827) Resp:  [16-32] 18 (03/15 0827) BP: (112-125)/(85-100) 118/91 (03/15 0827) SpO2:  [98 %-100 %] 100 % (03/15 0827) Weight:  [149 lb (67.6 kg)] 149 lb (67.6 kg) (03/15 0300) Last BM Date: 05/15/16 Filed Weights   05/14/16 0500 05/15/16 0500 05/16/16 0300  Weight: 149 lb 11.1 oz (67.9 kg) 149 lb 7.6 oz (67.8 kg) 149 lb (67.6 kg)    Physical Exam: CVP 4-5 General: NAD. Lying in bed.     HEENT: Normal flat. Carotids 2+ bilat; no bruits. No thyromegaly or nodule noted.  Cor: PMI laterally displaced. Regular. Slightly tachy. + summation gallop. 2/6 TR/MR Lungs: Clear, normal effort Abdomen: soft, NT, ND, no HSM. No bruits or masses. +BS  Extremities: no cyanosis, clubbing, rash. Warm. Multiple tattoos. No peripheral edema.  Neuro: alert & orientedx3, cranial nerves grossly intact. moves all 4 extremities w/o difficulty. Affect flat but appropriate.   Telemetry: Personally reviewed, Sinus tach 100-110s       Labs: Basic Metabolic Panel:  Recent  Labs Lab 05/11/16 1745 05/12/16 0438 05/13/16 0515 05/14/16 0502 05/15/16 0513 05/16/16 0515  NA  --  130* 132* 133* 132* 134*  K  --  3.3* 4.2 3.9 4.3 4.4  CL  --  97* 100* 104 100* 101  CO2  --  24 27 25 24 26   GLUCOSE  --  190* 133* 138* 145* 113*  BUN  --  24* 15 12 11 17   CREATININE  --  1.20 0.92 0.85 0.99 1.21  CALCIUM  --  7.8* 7.7* 7.5* 8.2* 8.3*  MG 1.8  --   --  1.6* 2.0  --     Liver Function Tests:  Recent Labs Lab 05/10/16 1148 05/11/16 1144 05/16/16 0515  AST 758* 722* 104*  ALT 460* 492* 160*  ALKPHOS 135* 130* 101  BILITOT 0.9 0.9 0.6  PROT 9.3* 8.2* 8.9*  ALBUMIN 2.7* 2.0* 2.4*    Recent Labs Lab 05/10/16 1148  LIPASE 15   No results for input(s): AMMONIA in the last 168 hours.  CBC:  Recent Labs Lab 05/10/16 1148  05/12/16 0438 05/13/16 0515 05/14/16 0502 05/15/16 0513 05/16/16 0515  WBC 11.2*  < > 10.4 9.9 8.4 9.9 10.0  NEUTROABS 6.9  --   --   --   --   --   --   HGB 9.9*  < > 9.5* 9.6* 9.3* 10.2* 9.8*  HCT 32.1*  < > 31.1* 32.1* 31.0* 33.8* 33.3*  MCV 79.9  < >  78.5 78.5 78.5 79.2 79.5  PLT 321  < > 260 301 278 338 323  < > = values in this interval not displayed.  Cardiac Enzymes:  Recent Labs Lab 05/10/16 1926 05/11/16 0019 05/11/16 0650  TROPONINI 0.21* 0.22* 0.23*    BNP: BNP (last 3 results)  Recent Labs  05/10/16 1212  BNP 1,543.0*    ProBNP (last 3 results) No results for input(s): PROBNP in the last 8760 hours.   CBG: No results for input(s): GLUCAP in the last 168 hours.  Coagulation Studies:  Recent Labs  05/15/16 1825 05/16/16 0515  LABPROT 15.6* 15.1  INR 1.23 1.18    Other results:   Imaging: No results found.  Scheduled medications . Chlorhexidine Gluconate Cloth  6 each Topical Q0600  . digoxin  0.125 mg Oral Daily  . folic acid  1 mg Oral Daily  . ivabradine  2.5 mg Oral BID WC  . mouth rinse  15 mL Mouth Rinse BID  . multivitamin with minerals  1 tablet Oral Daily  .  potassium chloride  40 mEq Oral Daily  . sacubitril-valsartan  1 tablet Oral BID  . sodium chloride flush  3 mL Intravenous Q12H  . sodium chloride flush  3 mL Intravenous Q12H  . spironolactone  25 mg Oral Daily  . thiamine  100 mg Oral Daily  . Warfarin - Pharmacist Dosing Inpatient   Does not apply q1800   Infusion meds . [START ON 05/17/2016] sodium chloride    . heparin 1,950 Units/hr (05/16/16 0520)   PRN meds sodium chloride, sodium chloride, acetaminophen, LORazepam, ondansetron (ZOFRAN) IV, sodium chloride flush, sodium chloride flush, sodium chloride flush, zolpidem   Assessment:   1. Cardiogenic shock 2. 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 Large LV thrombus 4. Shock liver 5. CKD, 3   --renal u/s shows bilateral medico-renal disease 6. Iron deficient anemia 7. ETOH abuse    -- had been drinking about 1 case beer + liquor daily 8. Ongoing tobacco use     --0.5-1ppd 9. Hydradrenitis suppurtivae    --s/p excision 1/18    --wound care now following  Plan/Discussion:    He was admitted with profound cardiogenic shock due to biventricular failure. Given appearance on echo, calcified LV thrombus and end-organ issues suspect his cardiomyopathy is not recent. Likely ETOH-related (versus viral).   Continue Entresto 24/26 mg BID.    Continue Spiro 25 mg daily. Continue digoxin 0.125 mg daily.  Coox 62% OFF milrinone.    CVP remains stable at 4-5 without diuretics.    No BB with marginal output and negative inotropy.  Continue corlanor 2.5 mg BID.   Will leave medications the same this am and consider up-titration based on cath. (Entresto vs corlanor)  Continue heparin for LV thrombus. Discuss risk of CVA from embolic phenomenon. Coumadin started last night. INR goal 2-3.  Will give tessalon pearls for dry cough.   Blood type O+. Poor candidate for advanced therapies with  ETOH, tobacco use, LV clot, and RV dysfunction. No change to current plan.    Continue to follow for symptoms of ETOH withdrawal. Needs to stop completely. HIV/Hep panels negative.  No change to current plan.   Wound care following for gluteal wounds.  Length of Stay: [address] [employer]  05/16/2016, 8:36 AM  Advanced Heart Failure Team Pager [phone] (M-F; 7a - 4p)  Please contact [facility] for night-coverage after hours (4p -7a ) and weekends on [email or web address]  Patient seen and examined with Otilio Saber, PA-C. We discussed all aspects of the encounter. I agree with the assessment and plan as stated above.   Doing well. Volume status and co-ox look good off milrinone. HR remains elevated on ivabradine. Continue current HF meds. Plan R/L heart cath today.   Pamula Luther,MD 3:18 PM

## 2016-05-16 NOTE — Progress Notes (Signed)
Patient transported to cath lab on monitor with RT and cath lab transport tech. Patient stable on arrival.

## 2016-05-16 NOTE — Progress Notes (Signed)
ANTICOAGULATION CONSULT NOTE - Follow Up Consult  Pharmacy Consult for Heparin Indication: mural thrombus  Allergies  Allergen Reactions  . Bacitracin Rash    Patient Measurements: Height: 6' (182.9 cm) Weight: 149 lb (67.6 kg) IBW/kg (Calculated) : 77.6  Vital Signs: Temp: 97.2 F (36.2 C) (03/15 0827) Temp Source: Axillary (03/15 0827) BP: 153/133 (03/15 1545) Pulse Rate: 0 (03/15 1555)  Labs:  Recent Labs  05/14/16 0502 05/15/16 0513 05/15/16 1825 05/16/16 0515  HGB 9.3* 10.2*  --  9.8*  HCT 31.0* 33.8*  --  33.3*  PLT 278 338  --  323  LABPROT  --   --  15.6* 15.1  INR  --   --  1.23 1.18  HEPARINUNFRC 0.43 0.61  --  0.37  CREATININE 0.85 0.99  --  1.21    Estimated Creatinine Clearance: 79.1 mL/min (by C-G formula based on SCr of 1.21 mg/dL).   Medications:  Heparin @ 1950 units/hr  Assessment: Paul Hahn continues on heparin for mural thrombus. Heparin level is therapeutic at 0.37. Coumadin started yesterday. INR 1.18 after first dose. LFTs elevated but trending down. CBC stable. No bleeding. Plan for cath today.  PM f/u > heparin turned off for cath this afternoon.  Pharmacy asked to resume IV heparin 8 hrs after sheath pull.  Sheath removed at 15:51 pm.  Goal of Therapy:  Heparin level 0.3-0.7 Monitor platelets by anticoagulation protocol: Yes   Plan:  -Resume IV heparin at 1950 units/hr at midnight tonight. -Check heparin level 6 hrs after gtt resumes. -Daily heparin level and CBC.  Tad Moore, BCPS  Clinical Pharmacist Pager [phone]  05/16/2016 4:16 PM

## 2016-05-16 NOTE — Progress Notes (Signed)
ANTICOAGULATION CONSULT NOTE - Follow Up Consult  Pharmacy Consult for Heparin and Coumadin Indication: mural thrombus  Allergies  Allergen Reactions  . Bacitracin Rash    Patient Measurements: Height: 6' (182.9 cm) Weight: 149 lb (67.6 kg) IBW/kg (Calculated) : 77.6  Vital Signs: Temp: 97.2 F (36.2 C) (03/15 0827) Temp Source: Axillary (03/15 0827) BP: 118/91 (03/15 0827) Pulse Rate: 112 (03/15 0827)  Labs:  Recent Labs  05/14/16 0502 05/15/16 0513 05/15/16 1825 05/16/16 0515  HGB 9.3* 10.2*  --  9.8*  HCT 31.0* 33.8*  --  33.3*  PLT 278 338  --  323  LABPROT  --   --  15.6* 15.1  INR  --   --  1.23 1.18  HEPARINUNFRC 0.43 0.61  --  0.37  CREATININE 0.85 0.99  --  1.21    Estimated Creatinine Clearance: 79.1 mL/min (by C-G formula based on SCr of 1.21 mg/dL).   Medications:  Heparin @ 1950 units/hr  Assessment: 40yom continues on heparin for mural thrombus. Heparin level is therapeutic at 0.37. Coumadin started yesterday. INR 1.18 after first dose. LFTs elevated but trending down. CBC stable. No bleeding. Plan for cath today.  Goal of Therapy:  INR 2-3 Monitor platelets by anticoagulation protocol: Yes   Plan:  1) Continue heparin at 1950 units/hr 2) Coumadin 7.5mg  x 1 3) Follow up after cath to resume heparin  Fredrik Rigger 05/16/2016,10:26 AM

## 2016-05-16 NOTE — Interval H&P Note (Signed)
History and Physical Interval Note:  05/16/2016 3:19 PM  Paul Hahn  has presented today for surgery, with the diagnosis of hf  The various methods of treatment have been discussed with the patient and family. After consideration of risks, benefits and other options for treatment, the patient has consented to  Procedure(s): Right/Left Heart Cath and Coronary Angiography (N/A) and possible coronary angioplasty as a surgical intervention .  The patient's history has been reviewed, patient examined, no change in status, stable for surgery.  I have reviewed the patient's chart and labs.  Questions were answered to the patient's satisfaction.     Ndeye Tenorio, Reuel Boom

## 2016-05-16 NOTE — Progress Notes (Signed)
Patient received back to room from cath lab. Patient stable, alert and oriented. VSS. TR band intact, site benign. Coban dressing intact to left ac area, no drainage noted.

## 2016-05-16 NOTE — Progress Notes (Signed)
Nutrition Follow-up  DOCUMENTATION CODES:   Not applicable  INTERVENTION:   -Continue HS snacks once diet advances -Continue MVI daily  NUTRITION DIAGNOSIS:   Inadequate oral intake related to  (ETOH abuse, cardiomyopathy) as evidenced by per patient/family report.  Progressing  GOAL:   Patient will meet greater than or equal to 90% of their needs  Progressing  MONITOR:   PO intake, I & O's, Weight trends  REASON FOR ASSESSMENT:   Consult Diet education  ASSESSMENT:   Pt with hx of ETOH abuse (drinks 1 case of beer and liquor daily) admitted with profound cardiogenic shock due to biventricular failure. Per heart failure team he is not candidate for transplant, Impella, and note ideal for VAD.   Pt sleeping soundly at time of visit. RD did not disturb.   Pt currently NPO for rt/lt heart cath. Pt previously on a Heart Healthy diet. Appetite remains good; meal completion 100% per doc flowsheets.   Per CWOCN note, pt with hidradenitis suppurativa s/p I&D and draining lesion on head. Will continue HS snacks and MVI to assist with additional calories, protein, vitamins, and minerals to support healing.   Labs reviewed: Na: 134 (on IV supplementation).   Diet Order:  Diet NPO time specified Except for: Sips with Meds  Skin:  Wound (see comment) (hidradenitis suppurativa s/p I&D, draining head lesion)  Last BM:  05/15/16  Height:   Ht Readings from Last 1 Encounters:  05/10/16 6' (1.829 m)    Weight:   Wt Readings from Last 1 Encounters:  05/16/16 149 lb (67.6 kg)    Ideal Body Weight:  80.9 kg  BMI:  Body mass index is 20.21 kg/m.  Estimated Nutritional Needs:   Kcal:  2100-2300  Protein:  95-115 grams  Fluid:  > 2.1 L/day  EDUCATION NEEDS:   Education needs addressed  Anureet Bruington A. Mayford Knife, RD, LDN, CDE Pager: [phone] After hours Pager: [phone]

## 2016-05-16 NOTE — Progress Notes (Signed)
Advanced Heart Failure Team Rounding Note   Reason for consult: Cardiogenic shock  Consulting MD: Mcdowell  HPI:    Reportedly admitted for PNA several weeks ago. Since that time has felt very rundown and SOB with any activity. Admitted with HF. Echo LVEF 10% with large apical thrombus. RV moderate to severe HK. Severe TR. Drinks about 1 case of beer per day + liquor. Smokes 0.5-1ppd.  Coox 62.0% this am OFF milrinone. K 4.4. Creatinine 0.99 CVP 4-5   Main complaint is hunger this morning. Still having occasional dry cough. No CP. Ambulating halls without difficulty.   Out 800 cc yesterday. Weight stable without diuretics.   On heparin for LV clot. Hepatitis panels negative. Blood type O positive   Wound care has seen for surgery in January 1/18 for gluteal hydradrenitris suppurtivae  Objective:    Vital Signs:   Temp:  [97.2 F (36.2 C)-98.5 F (36.9 C)] 97.2 F (36.2 C) (03/15 0827) Pulse Rate:  [101-113] 112 (03/15 0827) Resp:  [16-32] 18 (03/15 0827) BP: (112-125)/(85-100) 118/91 (03/15 0827) SpO2:  [98 %-100 %] 100 % (03/15 0827) Weight:  [149 lb (67.6 kg)] 149 lb (67.6 kg) (03/15 0300) Last BM Date: 05/15/16 Filed Weights   05/14/16 0500 05/15/16 0500 05/16/16 0300  Weight: 149 lb 11.1 oz (67.9 kg) 149 lb 7.6 oz (67.8 kg) 149 lb (67.6 kg)    Physical Exam: CVP 4-5 General: NAD. Lying in bed.     HEENT: Normal flat. Carotids 2+ bilat; no bruits. No thyromegaly or nodule noted.  Cor: PMI laterally displaced. Regular. Slightly tachy. + summation gallop. 2/6 TR/MR Lungs: Clear, normal effort Abdomen: soft, NT, ND, no HSM. No bruits or masses. +BS  Extremities: no cyanosis, clubbing, rash. Warm. Multiple tattoos. No peripheral edema.  Neuro: alert & orientedx3, cranial nerves grossly intact. moves all 4 extremities w/o difficulty. Affect flat but appropriate.   Telemetry: Personally reviewed, Sinus tach 100-110s       Labs: Basic Metabolic Panel:  Recent  Labs Lab 05/11/16 1745 05/12/16 0438 05/13/16 0515 05/14/16 0502 05/15/16 0513 05/16/16 0515  NA  --  130* 132* 133* 132* 134*  K  --  3.3* 4.2 3.9 4.3 4.4  CL  --  97* 100* 104 100* 101  CO2  --  24 27 25 24 26   GLUCOSE  --  190* 133* 138* 145* 113*  BUN  --  24* 15 12 11 17   CREATININE  --  1.20 0.92 0.85 0.99 1.21  CALCIUM  --  7.8* 7.7* 7.5* 8.2* 8.3*  MG 1.8  --   --  1.6* 2.0  --     Liver Function Tests:  Recent Labs Lab 05/10/16 1148 05/11/16 1144 05/16/16 0515  AST 758* 722* 104*  ALT 460* 492* 160*  ALKPHOS 135* 130* 101  BILITOT 0.9 0.9 0.6  PROT 9.3* 8.2* 8.9*  ALBUMIN 2.7* 2.0* 2.4*    Recent Labs Lab 05/10/16 1148  LIPASE 15   No results for input(s): AMMONIA in the last 168 hours.  CBC:  Recent Labs Lab 05/10/16 1148  05/12/16 0438 05/13/16 0515 05/14/16 0502 05/15/16 0513 05/16/16 0515  WBC 11.2*  < > 10.4 9.9 8.4 9.9 10.0  NEUTROABS 6.9  --   --   --   --   --   --   HGB 9.9*  < > 9.5* 9.6* 9.3* 10.2* 9.8*  HCT 32.1*  < > 31.1* 32.1* 31.0* 33.8* 33.3*  MCV 79.9  < >  78.5 78.5 78.5 79.2 79.5  PLT 321  < > 260 301 278 338 323  < > = values in this interval not displayed.  Cardiac Enzymes:  Recent Labs Lab 05/10/16 1926 05/11/16 0019 05/11/16 0650  TROPONINI 0.21* 0.22* 0.23*    BNP: BNP (last 3 results)  Recent Labs  05/10/16 1212  BNP 1,543.0*    ProBNP (last 3 results) No results for input(s): PROBNP in the last 8760 hours.   CBG: No results for input(s): GLUCAP in the last 168 hours.  Coagulation Studies:  Recent Labs  05/15/16 1825 05/16/16 0515  LABPROT 15.6* 15.1  INR 1.23 1.18    Other results:   Imaging: No results found.  Scheduled medications . Chlorhexidine Gluconate Cloth  6 each Topical Q0600  . digoxin  0.125 mg Oral Daily  . folic acid  1 mg Oral Daily  . ivabradine  2.5 mg Oral BID WC  . mouth rinse  15 mL Mouth Rinse BID  . multivitamin with minerals  1 tablet Oral Daily  .  potassium chloride  40 mEq Oral Daily  . sacubitril-valsartan  1 tablet Oral BID  . sodium chloride flush  3 mL Intravenous Q12H  . sodium chloride flush  3 mL Intravenous Q12H  . spironolactone  25 mg Oral Daily  . thiamine  100 mg Oral Daily  . Warfarin - Pharmacist Dosing Inpatient   Does not apply q1800   Infusion meds . [START ON 05/17/2016] sodium chloride    . heparin 1,950 Units/hr (05/16/16 0520)   PRN meds sodium chloride, sodium chloride, acetaminophen, LORazepam, ondansetron (ZOFRAN) IV, sodium chloride flush, sodium chloride flush, sodium chloride flush, zolpidem   Assessment:   1. Cardiogenic shock 2. 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 Large LV thrombus 4. Shock liver 5. CKD, 3   --renal u/s shows bilateral medico-renal disease 6. Iron deficient anemia 7. ETOH abuse    -- had been drinking about 1 case beer + liquor daily 8. Ongoing tobacco use     --0.5-1ppd 9. Hydradrenitis suppurtivae    --s/p excision 1/18    --wound care now following  Plan/Discussion:    He was admitted with profound cardiogenic shock due to biventricular failure. Given appearance on echo, calcified LV thrombus and end-organ issues suspect his cardiomyopathy is not recent. Likely ETOH-related (versus viral).   Continue Entresto 24/26 mg BID.    Continue Spiro 25 mg daily. Continue digoxin 0.125 mg daily.  Coox 62% OFF milrinone.    CVP remains stable at 4-5 without diuretics.    No BB with marginal output and negative inotropy.  Continue corlanor 2.5 mg BID.   Will leave medications the same this am and consider up-titration based on cath. (Entresto vs corlanor)  Continue heparin for LV thrombus. Discuss risk of CVA from embolic phenomenon. Coumadin started last night. INR goal 2-3.  Will give tessalon pearls for dry cough.   Blood type O+. Poor candidate for advanced therapies with  ETOH, tobacco use, LV clot, and RV dysfunction. No change to current plan.    Continue to follow for symptoms of ETOH withdrawal. Needs to stop completely. HIV/Hep panels negative.  No change to current plan.   Wound care following for gluteal wounds.  Length of Stay: [address] [employer]  05/16/2016, 8:36 AM  Advanced Heart Failure Team Pager [phone] (M-F; 7a - 4p)  Please contact [facility] for night-coverage after hours (4p -7a ) and weekends on [email or web address]  Patient seen and examined with Otilio Saber, PA-C. We discussed all aspects of the encounter. I agree with the assessment and plan as stated above.   Doing well. Volume status and co-ox look good off milrinone. HR remains elevated on ivabradine. Continue current HF meds. Plan R/L heart cath today.   Analeigha Nauman,MD 3:18 PM

## 2016-05-17 ENCOUNTER — Encounter (HOSPITAL_COMMUNITY): Payer: Self-pay | Admitting: Internal Medicine

## 2016-05-17 LAB — BASIC METABOLIC PANEL
ANION GAP: 7 (ref 5–15)
BUN: 15 mg/dL (ref 6–20)
CALCIUM: 8.5 mg/dL — AB (ref 8.9–10.3)
CO2: 26 mmol/L (ref 22–32)
CREATININE: 1.08 mg/dL (ref 0.61–1.24)
Chloride: 101 mmol/L (ref 101–111)
GFR calc non Af Amer: >60 mL/min (ref >60)
Glucose, Bld: 109 mg/dL — ABNORMAL HIGH (ref 65–99)
Potassium: 4.7 mmol/L (ref 3.5–5.1)
SODIUM: 134 mmol/L — AB (ref 135–145)

## 2016-05-17 LAB — CBC
HCT: 33.2 % — ABNORMAL LOW (ref 39.0–52.0)
HEMOGLOBIN: 9.8 g/dL — AB (ref 13.0–17.0)
MCH: 23.8 pg — ABNORMAL LOW (ref 26.0–34.0)
MCHC: 29.5 g/dL — ABNORMAL LOW (ref 30.0–36.0)
MCV: 80.6 fL (ref 78.0–100.0)
PLATELETS: 316 10*3/uL (ref 150–400)
RBC: 4.12 MIL/uL — AB (ref 4.22–5.81)
RDW: 20.4 % — ABNORMAL HIGH (ref 11.5–15.5)
WBC: 8.8 10*3/uL (ref 4.0–10.5)

## 2016-05-17 LAB — COOXEMETRY PANEL
CARBOXYHEMOGLOBIN: 1.5 % (ref 0.5–1.5)
Methemoglobin: 0.9 % (ref 0.0–1.5)
O2 SAT: 67.4 %
Total hemoglobin: 10.5 g/dL — ABNORMAL LOW (ref 12.0–16.0)

## 2016-05-17 LAB — PROTIME-INR
INR: 1.17
PROTHROMBIN TIME: 15 s (ref 11.4–15.2)

## 2016-05-17 LAB — HEPARIN LEVEL (UNFRACTIONATED): Heparin Unfractionated: 0.25 IU/mL — ABNORMAL LOW (ref 0.30–0.70)

## 2016-05-17 MED ORDER — HEPARIN (PORCINE) IN NACL 100-0.45 UNIT/ML-% IJ SOLN: 2000.0000 [IU]/h | INTRAMUSCULAR | Status: DC

## 2016-05-17 MED ORDER — WARFARIN SODIUM 10 MG PO TABS
10.0000 mg | ORAL_TABLET | Freq: Once | ORAL | Status: AC
  Administered 2016-05-17: 10 mg via ORAL
  Filled 2016-05-17: qty 1

## 2016-05-17 MED ORDER — POTASSIUM CHLORIDE CRYS ER 20 MEQ PO TBCR
20.0000 meq | EXTENDED_RELEASE_TABLET | Freq: Every day | ORAL | Status: DC
  Administered 2016-05-17 – 2016-05-18 (×2): 20 meq via ORAL
  Filled 2016-05-17: qty 1

## 2016-05-17 MED ORDER — IVABRADINE HCL 5 MG PO TABS
5.0000 mg | ORAL_TABLET | Freq: Two times a day (BID) | ORAL | Status: DC
  Administered 2016-05-17 – 2016-05-18 (×2): 5 mg via ORAL
  Filled 2016-05-17 (×3): qty 1

## 2016-05-17 MED ORDER — ENOXAPARIN SODIUM 100 MG/ML ~~LOC~~ SOLN
1.5000 mg/kg | SUBCUTANEOUS | Status: DC
  Administered 2016-05-17 – 2016-05-18 (×2): 100 mg via SUBCUTANEOUS
  Filled 2016-05-17 (×2): qty 1

## 2016-05-17 MED ORDER — SACUBITRIL-VALSARTAN 49-51 MG PO TABS
1.0000 | ORAL_TABLET | Freq: Two times a day (BID) | ORAL | Status: DC
  Administered 2016-05-17 – 2016-05-18 (×2): 1 via ORAL
  Filled 2016-05-17 (×3): qty 1

## 2016-05-17 NOTE — Discharge Summary (Signed)
Advanced Heart Failure Discharge Note  Discharge Summary   Patient ID: Paul Hahn MRN: [id number], DOB/AGE: 10/04/1975 41 y.o. Admit date: 05/10/2016 D/C date:     05/18/2016    PCP: "Dr Darl Pikes" at [facility] [address] [place], [place] [address]  Phone: [phone]  Fax: [phone]   Primary Discharge Diagnoses:  1. Cardiogenic shock 2. 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 Large LV thrombus 4. Shock liver 5. CKD, 3 6. Iron deficient anemia 7. ETOH abuse 8. Ongoing tobacco use  9. Hidradenitis suppurativa   Hospital Course:   Paul Hahn is a 41 y.o. male history of tobacco abuse, alcohol abuse, recent PNA, and hidradenitis suppurativa s/p excision 1/18.  Admitted with HF. Echo 05/10/16 LVEF 10% with large apical thrombus, RV moderate to severe HK, severe TR.   Pt started on milrinone with cardiogenic shock. However initial mixed venous saturation 43% and decreased to 37%. Milrinone increased. Heparin started for patients LV thrombus.  Pt admitted to drinking ~ 1 case of beer per day + liquor and smoked 0.5 - 1 ppd. Denies substance abuse.  HIV and Hepatitis panels negative.  Pt diuresed and medications titrated as tolerated.  Pt tolerated wean of pressor support after diuresis and careful afterload reduction. Maintained relatively stable coox with milrinone wean.    [facility] 05/16/16 with normal coronaries, severe NICM, and elevatd filling pressures with mildly decreased CO and narrow pulse pressure. Numbers as below. Pt had R arm spasm during heart catheterization which quickly resolved. Did result in mild soreness the following day. 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  Pt not thought to be transplant candidate due to ETOH and tobacco use, no Impella with LV clot and RV dysfunction, not LVAD candidate for same reason, + ETOH abuse.    Wound care followed during this admission for gluteal wound s/p recent hidradenitis suppurativa excision.  Dr. Gala Romney saw on 05/18/2016. He noted that the patient had been instructed in the Lovenox bridge to therapeutic Coumadin for his LV thrombus. The need to abstain completely from EtOH was reinforced. His PICC line was discontinued. Social work and case management were informed.  As patient remained stable off milrinone, he was thought to be stable for discharge on 03/17 with close follow up as below.    Discharge Weight Range: 149-155 lbs Discharge Vitals: Blood pressure 90/60, pulse (!) 106, temperature 98.5 F (36.9 C), temperature source Oral, resp. rate 20, height 6' (1.829 m), weight 151 lb 14.4 oz (68.9 kg), SpO2 98 %.  Labs: Lab Results  Component Value Date   WBC 8.5 05/18/2016   HGB 9.7 (L) 05/18/2016   HCT 32.6 (L) 05/18/2016   MCV 80.1 05/18/2016   PLT 359 05/18/2016    Recent Labs Lab 05/16/16 0515  05/18/16 0500  NA 134*  < > 133*  K 4.4  < > 4.1  CL 101  < > 104  CO2 26  < > 24  BUN 17  < > 12  CREATININE 1.21  < > 0.95  CALCIUM 8.3*  < > 8.3*  PROT 8.9*  --   --   BILITOT 0.6  --   --   ALKPHOS 101  --   --   ALT 160*  --   --   AST 104*  --   --   GLUCOSE 113*  < > 115*  < > =  values in this interval not displayed.  BNP (last 3 results)  Recent Labs  05/10/16 1212  BNP 1,543.0*    Diagnostic Studies/Procedures   [facility] 05/16/16 Normal Coronaries RA = 6 RV = 44/11 PA = 44/20 (31) PCW = 15 Ao = 98/80 (88) LV = 108/31 Fick cardiac output/index = 4.8/2.6 PVR = 3.3 WU Ao sat = 95% PA sat = 55%, 56%  Discharge Medications   Allergies as of 05/18/2016      Reactions   Bacitracin Rash      Medication List    STOP taking these medications   levofloxacin 750 MG tablet Commonly known as:  LEVAQUIN     TAKE these medications   digoxin 0.125 MG tablet Commonly known as:  LANOXIN Take 1 tablet (0.125 mg total) by mouth  daily. Start taking on:  05/19/2016   enoxaparin 100 MG/ML injection Commonly known as:  LOVENOX Inject 1 mL (100 mg total) into the skin daily.   furosemide 40 MG tablet Commonly known as:  LASIX Take 1 tablet (40 mg total) by mouth daily.   ivabradine 5 MG Tabs tablet Commonly known as:  CORLANOR Take 1 tablet (5 mg total) by mouth 2 (two) times daily with a meal.   potassium chloride SA 20 MEQ tablet Commonly known as:  K-DUR,KLOR-CON Take 1 tablet (20 mEq total) by mouth daily. Start taking on:  05/19/2016   sacubitril-valsartan 49-51 MG Commonly known as:  ENTRESTO Take 1 tablet by mouth 2 (two) times daily.   spironolactone 25 MG tablet Commonly known as:  ALDACTONE Take 1 tablet (25 mg total) by mouth daily. Start taking on:  05/19/2016   warfarin 5 MG tablet Commonly known as:  COUMADIN Take 1-2 tablets (5-10 mg total) by mouth daily at 6 PM. Or as directed       Disposition   The patient will be discharged in stable condition to home. Discharge Instructions    (HEART FAILURE PATIENTS) Call MD:  Anytime you have any of the following symptoms: 1) 3 pound weight gain in 24 hours or 5 pounds in 1 week 2) shortness of breath, with or without a dry hacking cough 3) swelling in the hands, feet or stomach 4) if you have to sleep on extra pillows at night in order to breathe.    Complete by:  As directed    Diet - low sodium heart healthy    Complete by:  As directed    Increase activity slowly    Complete by:  As directed      Follow-up Information    [facility] Follow up on 05/27/2016.   Specialty:  Cardiology Why:  at 0930 for post hospital follow up. Please bring all of your medications to your visit. Parking code is [id number]. Psychologist, sport and exercise through [employer] representative off of [address]. Can also park in lower ED lot and enter through [employer].**The office will call you on Monday** Contact information: [address] [id number] mc [place] [address] [phone]            Duration of Discharge Encounter: Greater than 35 minutes   Signed, Paul Hahn 05/18/2016 3:41 PM  Patient seen and examined with Paul Demark, PA-C. We discussed all aspects of the encounter. I agree with the assessment and plan as stated above.   He is ready for discharge. Need to follow closely for recurrent HF symptoms. See my rounding note for further details.  Hahn, Daniel,MD 10:19 PM

## 2016-05-17 NOTE — Care Management Note (Addendum)
Case Management Note  Patient Details  Name: Taliek Kupfer MRN: [id number] Date of Birth: 1975-11-06  Subjective/Objective:    Pt admitted with SOB                Action/Plan:   PTA from home.  Pt will discharge home Corlanor.  Benefit check submitted - prior auth required.  CM spoke directly with PA for attending and was informed that prior Berkley Harvey has been completed.  CM will provide pt with discount monthly payment of $20 .     Expected Discharge Date:                  Expected Discharge Plan:  Home/Self Care  In-House Referral:     Discharge planning Services  CM Consult, Medication Assistance  Post Acute Care Choice:    Choice offered to:     DME Arranged:    DME Agency:     HH Arranged:    HH Agency:     Status of Service:  In process, will continue to follow  If discussed at Long Length of Stay Meetings, dates discussed:    Additional Comments:  Cherylann Parr, RN 05/17/2016, 3:02 PM

## 2016-05-17 NOTE — Plan of Care (Signed)
Problem: Health Behavior/Discharge Planning: Goal: Ability to manage health-related needs will improve Outcome: Progressing Needs more education.

## 2016-05-17 NOTE — Discharge Instructions (Addendum)
Weigh daily first thing in the morning after urination, wearing the same clothes every day.   Information on my medicine - Coumadin   (Warfarin)  This medication education was reviewed with me or my healthcare representative as part of my discharge preparation.  The pharmacist that spoke with me during my hospital stay was:  Fredrik Rigger, [facility]  Why was Coumadin prescribed for you? Coumadin was prescribed for you because you have a blood clot or a medical condition that can cause an increased risk of forming blood clots. Blood clots can cause serious health problems by blocking the flow of blood to the heart, lung, or brain. Coumadin can prevent harmful blood clots from forming. As a reminder your indication for Coumadin is:  Blood clot in your heart Select from menu  What test will check on my response to Coumadin? While on Coumadin (warfarin) you will need to have an INR test regularly to ensure that your dose is keeping you in the desired range. The INR (international normalized ratio) number is calculated from the result of the laboratory test called prothrombin time (PT).  If an INR APPOINTMENT HAS NOT ALREADY BEEN MADE FOR YOU please schedule an appointment to have this lab work done by your health care provider within 7 days. Your INR goal is usually a number between:  2 to 3 or your provider may give you a more narrow range like 2-2.5.  Ask your health care provider during an office visit what your goal INR is.  What  do you need to  know  About  COUMADIN? Take Coumadin (warfarin) exactly as prescribed by your healthcare provider about the same time each day.  DO NOT stop taking without talking to the doctor who prescribed the medication.  Stopping without other blood clot prevention medication to take the place of Coumadin may increase your risk of developing a new clot or stroke.  Get refills before you run out.  What do you do if you miss a dose? If you miss a dose, take it as  soon as you remember on the same day then continue your regularly scheduled regimen the next day.  Do not take two doses of Coumadin at the same time.  Important Safety Information A possible side effect of Coumadin (Warfarin) is an increased risk of bleeding. You should call your healthcare provider right away if you experience any of the following: ? Bleeding from an injury or your nose that does not stop. ? Unusual colored urine (red or dark brown) or unusual colored stools (red or black). ? Unusual bruising for unknown reasons. ? A serious fall or if you hit your head (even if there is no bleeding).  Some foods or medicines interact with Coumadin (warfarin) and might alter your response to warfarin. To help avoid this: ? Eat a balanced diet, maintaining a consistent amount of Vitamin K. ? Notify your provider about major diet changes you plan to make. ? Avoid alcohol or limit your intake to 1 drink for women and 2 drinks for men per day. (1 drink is 5 oz. wine, 12 oz. beer, or 1.5 oz. liquor.)  Make sure that ANY health care provider who prescribes medication for you knows that you are taking Coumadin (warfarin).  Also make sure the healthcare provider who is monitoring your Coumadin knows when you have started a new medication including herbals and non-prescription products.  Coumadin (Warfarin)  Major Drug Interactions  Increased Warfarin Effect Decreased Warfarin Effect  Alcohol (large quantities) Antibiotics (esp. Septra/Bactrim, Flagyl, Cipro) Amiodarone (Cordarone) Aspirin (ASA) Cimetidine (Tagamet) Megestrol (Megace) NSAIDs (ibuprofen, naproxen, etc.) Piroxicam (Feldene) Propafenone (Rythmol SR) Propranolol (Inderal) Isoniazid (INH) Posaconazole (Noxafil) Barbiturates (Phenobarbital) Carbamazepine (Tegretol) Chlordiazepoxide (Librium) Cholestyramine (Questran) Griseofulvin Oral Contraceptives Rifampin Sucralfate (Carafate) Vitamin K   Coumadin (Warfarin) Major  Herbal Interactions  Increased Warfarin Effect Decreased Warfarin Effect  Garlic Ginseng Ginkgo biloba Coenzyme Q10 Green tea St. Johns wort    Coumadin (Warfarin) FOOD Interactions  Eat a consistent number of servings per week of foods HIGH in Vitamin K (1 serving =  cup)  Collards (cooked, or boiled & drained) Kale (cooked, or boiled & drained) Mustard greens (cooked, or boiled & drained) Parsley *serving size only =  cup Spinach (cooked, or boiled & drained) Swiss chard (cooked, or boiled & drained) Turnip greens (cooked, or boiled & drained)  Eat a consistent number of servings per week of foods MEDIUM-HIGH in Vitamin K (1 serving = 1 cup)  Asparagus (cooked, or boiled & drained) Broccoli (cooked, boiled & drained, or raw & chopped) Brussel sprouts (cooked, or boiled & drained) *serving size only =  cup Lettuce, raw (green leaf, endive, romaine) Spinach, raw Turnip greens, raw & chopped   These websites have more information on Coumadin (warfarin):  FailFactory.se; VeganReport.com.au;

## 2016-05-17 NOTE — Progress Notes (Signed)
PT Cancellation Note  Patient Details Name: Paul Hahn MRN: [id number] DOB: 15-Apr-1975   Cancelled Treatment:    Reason Eval/Treat Not Completed: Patient declined, no reason specified. Pt refusing to participate in therapy session until he has a shower/bath. PT will continue to f/u with pt as available.    Alessandra Bevels Johnatha Zeidman 05/17/2016, 3:32 PM

## 2016-05-17 NOTE — Progress Notes (Addendum)
ANTICOAGULATION CONSULT NOTE - Follow Up Consult  Pharmacy Consult for Heparin and Coumadin Indication: mural thrombus  Allergies  Allergen Reactions  . Bacitracin Rash    Patient Measurements: Height: 6' (182.9 cm) Weight: 149 lb (67.6 kg) IBW/kg (Calculated) : 77.6  Vital Signs: Temp: 97.8 F (36.6 C) (03/16 0809) Temp Source: Oral (03/16 0809) BP: 129/85 (03/16 0809) Pulse Rate: 116 (03/15 2340)  Labs:  Recent Labs  05/15/16 0513 05/15/16 1825 05/16/16 0515 05/17/16 0600 05/17/16 0900  HGB 10.2*  --  9.8* 9.8*  --   HCT 33.8*  --  33.3* 33.2*  --   PLT 338  --  323 316  --   LABPROT  --  15.6* 15.1  --  15.0  INR  --  1.23 1.18  --  1.17  HEPARINUNFRC 0.61  --  0.37  --  0.25*  CREATININE 0.99  --  1.21 1.08  --     Estimated Creatinine Clearance: 88.7 mL/min (by C-G formula based on SCr of 1.08 mg/dL).   Medications:  Heparin @ 1950 units/hr  Assessment: 40yom continues on heparin bridge to coumadin for mural thrombus. Heparin resumed post-cath yesterday. Heparin level slightly below goal 0.25. INR 1.17 after two doses of coumadin 7.5mg  - will increase today. CBC stable. No bleeding.  Goal of Therapy:  INR 2-3 Monitor platelets by anticoagulation protocol: Yes   Plan:  1) Increase heparin to 2000 units/hr 2) Increase coumadin to 10mg  3) Daily heparin level, INR, CBC  Ane Conerly, Hessie Diener 05/17/2016,10:23 AM    ADDENDUM: Patient wants to go home tomorrow so pharmacy asked to switch heparin to lovenox. Will dose lovenox 100mg  sq q24 (1.5mg /kg q24). Case management consult ordered to assist with cost.  Fredrik Rigger 05/17/2016, 11:57 AM

## 2016-05-17 NOTE — Progress Notes (Signed)
Advanced Heart Failure Team Rounding Note   Reason for consult: Cardiogenic shock  Consulting MD: Mcdowell  HPI:     Admitted with new onset HF. Echo LVEF 10% with large apical thrombus. RV moderate to severe HK. Severe TR. Drinks about 1 case of beer per day + liquor. Smokes 0.5-1ppd.  Coox 67.4% this am off milrinone. K 4.7. Creatinine 1.08 CVP 6-7  Feeling OK this am but R arm sore from cath yesterday.  No numbness or tingling. No bleeding. No hematoma. Didn't realize tessalon pearls were as needed and could ask for them, but cough has improved regardless.   Out 1.1 L with no lasix. cc yesterday. Weight stable without diuretics. Feels good. No dyspnea. Eager to go home .  On heparin for LV clot. Hepatitis panels negative. Blood type O positive  No bleeding   Wound care has seen for surgery in January 1/18 for gluteal hydradrenitris suppurtivae  [facility] 05/16/16 Normal Coronaries Right Hemodynamics RA = 6 RV = 44/11 PA = 44/20 (31) PCW = 15 Ao = 98/80 (88) LV = 108/31 Fick cardiac output/index = 4.8/2.6 PVR = 3.3 WU Ao sat = 95% PA sat = 55%, 56%  Objective:    Vital Signs:   Temp:  [97.2 F (36.2 C)-99 F (37.2 C)] 97.8 F (36.6 C) (03/16 0809) Pulse Rate:  [0-130] 116 (03/15 2340) Resp:  [0-75] 21 (03/15 2340) BP: (116-153)/(72-133) 129/85 (03/16 0809) SpO2:  [0 %-100 %] 100 % (03/15 2340) Last BM Date: 05/15/16 Filed Weights   05/14/16 0500 05/15/16 0500 05/16/16 0300  Weight: 149 lb 11.1 oz (67.9 kg) 149 lb 7.6 oz (67.8 kg) 149 lb (67.6 kg)    Physical Exam: CVP 6-7 General: NAD lying in bed.      HEENT: Normal flat. Carotids 2+ bilat; no bruits. No thyromegaly or nodule noted.  Cor: PMI laterally displaced. Regular. Slightly tachy. + summation gallop  2/6 TR/MR Lungs: CTAB, normal effort no wheeze Abdomen: soft, NT, ND, no HSM. No bruits or masses. Good bowel sounds Extremities: no cyanosis, clubbing, rash. Warm. Multiple tattoos. No peripheral  edema.  R arm with good pulses. No hematoma. Sensation intact Neuro: alert & orientedx3, cranial nerves grossly intact. moves all 4 extremities w/o difficulty. Affect pleasant  Telemetry: Personally reviewed, Sinus tach 110-120s    Labs: Basic Metabolic Panel:  Recent Labs Lab 05/11/16 1745  05/13/16 0515 05/14/16 0502 05/15/16 0513 05/16/16 0515 05/17/16 0600  NA  --   < > 132* 133* 132* 134* 134*  K  --   < > 4.2 3.9 4.3 4.4 4.7  CL  --   < > 100* 104 100* 101 101  CO2  --   < > 27 25 24 26 26   GLUCOSE  --   < > 133* 138* 145* 113* 109*  BUN  --   < > 15 12 11 17 15   CREATININE  --   < > 0.92 0.85 0.99 1.21 1.08  CALCIUM  --   < > 7.7* 7.5* 8.2* 8.3* 8.5*  MG 1.8  --   --  1.6* 2.0  --   --   < > = values in this interval not displayed.  Liver Function Tests:  Recent Labs Lab 05/10/16 1148 05/11/16 1144 05/16/16 0515  AST 758* 722* 104*  ALT 460* 492* 160*  ALKPHOS 135* 130* 101  BILITOT 0.9 0.9 0.6  PROT 9.3* 8.2* 8.9*  ALBUMIN 2.7* 2.0* 2.4*    Recent  Labs Lab 05/10/16 1148  LIPASE 15   No results for input(s): AMMONIA in the last 168 hours.  CBC:  Recent Labs Lab 05/10/16 1148  05/13/16 0515 05/14/16 0502 05/15/16 0513 05/16/16 0515 05/17/16 0600  WBC 11.2*  < > 9.9 8.4 9.9 10.0 8.8  NEUTROABS 6.9  --   --   --   --   --   --   HGB 9.9*  < > 9.6* 9.3* 10.2* 9.8* 9.8*  HCT 32.1*  < > 32.1* 31.0* 33.8* 33.3* 33.2*  MCV 79.9  < > 78.5 78.5 79.2 79.5 80.6  PLT 321  < > 301 278 338 323 316  < > = values in this interval not displayed.  Cardiac Enzymes:  Recent Labs Lab 05/10/16 1926 05/11/16 0019 05/11/16 0650  TROPONINI 0.21* 0.22* 0.23*    BNP: BNP (last 3 results)  Recent Labs  05/10/16 1212  BNP 1,543.0*    ProBNP (last 3 results) No results for input(s): PROBNP in the last 8760 hours.   CBG: No results for input(s): GLUCAP in the last 168 hours.  Coagulation Studies:  Recent Labs  05/15/16 1825 05/16/16 0515    LABPROT 15.6* 15.1  INR 1.23 1.18    Other results:   Imaging: No results found.  Scheduled medications . Chlorhexidine Gluconate Cloth  6 each Topical Q0600  . digoxin  0.125 mg Oral Daily  . folic acid  1 mg Oral Daily  . ivabradine  2.5 mg Oral BID WC  . mouth rinse  15 mL Mouth Rinse BID  . multivitamin with minerals  1 tablet Oral Daily  . potassium chloride  40 mEq Oral Daily  . sacubitril-valsartan  1 tablet Oral BID  . sodium chloride flush  3 mL Intravenous Q12H  . sodium chloride flush  3 mL Intravenous Q12H  . spironolactone  25 mg Oral Daily  . thiamine  100 mg Oral Daily  . Warfarin - Pharmacist Dosing Inpatient   Does not apply q1800   Infusion meds . heparin 1,950 Units/hr (05/17/16 0000)   PRN meds sodium chloride, sodium chloride, acetaminophen, benzonatate, LORazepam, ondansetron (ZOFRAN) IV, sodium chloride flush, sodium chloride flush, sodium chloride flush, zolpidem   Assessment:   1. Cardiogenic shock 2. 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 Large LV thrombus 4. Shock liver 5. CKD, 3   --renal u/s shows bilateral medico-renal disease 6. Iron deficient anemia 7. ETOH abuse    -- had been drinking about 1 case beer + liquor daily 8. Ongoing tobacco use     --0.5-1ppd 9. Hydradrenitis suppurtivae    --s/p excision 1/18    --wound care now following  Plan/Discussion:    He was admitted with profound cardiogenic shock due to biventricular failure. Given appearance on echo, calcified LV thrombus and end-organ issues suspect his cardiomyopathy is not recent. Likely ETOH-related (versus viral).   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 Elevated filling pressures and mildly decreased CO.   Continue Entresto 24/26 mg BID.  Continue Spiro 25 mg daily. Continue digoxin 0.125 mg daily.  Coox 67.4% OFF milrinone this am. Marginal on  cath yesterday. Plan to watch for 24 more hours due to severe NICM. Needs to mobilize.    CVP 6-7. Remains off lasix for now.   No BB with marginal output and negative inotropy.  Continue corlanor 2.5 mg  BID.   R arm tender from cath at radial and brachial sites. No warmth or erythema, no hematoma.  No loss of sensation, no bruit. 2+ pulses. Will follow.   Continue heparin for LV thrombus. Discussed risk of CVA from embolic phenomenon. Coumadin started 05/15/16. INR goal 2-3. INR pending this am. Pharmacy dosing.   Blood type O+. Poor candidate for advanced therapies with ETOH, tobacco use, LV clot, and RV dysfunction. No change to current plan.   Continue to follow for symptoms of ETOH withdrawal. Needs to stop completely. HIV/Hep panels negative.  No change to current plans.    Wound care following for gluteal wounds.  Length of Stay: [address] [place], [place]  05/17/2016, 8:40 AM  Advanced Heart Failure Team Pager [phone] (M-F; 7a - 4p)  Please contact [facility] for night-coverage after hours (4p -7a ) and weekends on [email or web address]  Patient seen and examined with Otilio Saber, PA-C. We discussed all aspects of the encounter. I agree with the assessment and plan as stated above.   Cath results reviewed with him. Normal coronaries. RHC numbers ok but very low pulse pressure. Remains tenuous. Still tachy. Co-ox ok off milrinone.   Will increase corlanor to 5 bid. Continue dig, entresto and spiro.   Eager to go home tomorrow so will need lovenox bridge due to fresh LV clot.   Laquana Villari,MD 9:54 PM

## 2016-05-18 LAB — CBC
HCT: 32.6 % — ABNORMAL LOW (ref 39.0–52.0)
Hemoglobin: 9.7 g/dL — ABNORMAL LOW (ref 13.0–17.0)
MCH: 23.8 pg — ABNORMAL LOW (ref 26.0–34.0)
MCHC: 29.8 g/dL — ABNORMAL LOW (ref 30.0–36.0)
MCV: 80.1 fL (ref 78.0–100.0)
PLATELETS: 359 10*3/uL (ref 150–400)
RBC: 4.07 MIL/uL — AB (ref 4.22–5.81)
RDW: 20.6 % — AB (ref 11.5–15.5)
WBC: 8.5 10*3/uL (ref 4.0–10.5)

## 2016-05-18 LAB — PROTIME-INR
INR: 1.29
Prothrombin Time: 16.2 seconds — ABNORMAL HIGH (ref 11.4–15.2)

## 2016-05-18 LAB — COOXEMETRY PANEL
CARBOXYHEMOGLOBIN: 1.7 % — AB (ref 0.5–1.5)
METHEMOGLOBIN: 0.7 % (ref 0.0–1.5)
O2 SAT: 59.8 %
TOTAL HEMOGLOBIN: 10.4 g/dL — AB (ref 12.0–16.0)

## 2016-05-18 LAB — BASIC METABOLIC PANEL
ANION GAP: 5 (ref 5–15)
BUN: 12 mg/dL (ref 6–20)
CALCIUM: 8.3 mg/dL — AB (ref 8.9–10.3)
CO2: 24 mmol/L (ref 22–32)
Chloride: 104 mmol/L (ref 101–111)
Creatinine, Ser: 0.95 mg/dL (ref 0.61–1.24)
GFR calc non Af Amer: >60 mL/min (ref >60)
Glucose, Bld: 115 mg/dL — ABNORMAL HIGH (ref 65–99)
POTASSIUM: 4.1 mmol/L (ref 3.5–5.1)
SODIUM: 133 mmol/L — AB (ref 135–145)

## 2016-05-18 LAB — DIGOXIN LEVEL: Digoxin Level: <0.2 ng/mL — ABNORMAL LOW (ref 0.8–2.0)

## 2016-05-18 MED ORDER — IVABRADINE HCL 5 MG PO TABS: 5.0000 mg | ORAL_TABLET | Freq: Two times a day (BID) | ORAL | 6 refills | Status: DC

## 2016-05-18 MED ORDER — WARFARIN SODIUM 5 MG PO TABS: 5.0000 mg | ORAL_TABLET | Freq: Every day | ORAL | 3 refills | Status: DC

## 2016-05-18 MED ORDER — ENOXAPARIN SODIUM 100 MG/ML ~~LOC~~ SOLN: 100.0000 mg | SUBCUTANEOUS | 1 refills | Status: DC

## 2016-05-18 MED ORDER — FUROSEMIDE 40 MG PO TABS: 40.0000 mg | ORAL_TABLET | Freq: Every day | ORAL | 6 refills | Status: DC

## 2016-05-18 MED ORDER — DIGOXIN 125 MCG PO TABS: 0.1250 mg | ORAL_TABLET | Freq: Every day | ORAL | 6 refills | Status: DC

## 2016-05-18 MED ORDER — SPIRONOLACTONE 25 MG PO TABS: 25.0000 mg | ORAL_TABLET | Freq: Every day | ORAL | 6 refills | Status: DC

## 2016-05-18 MED ORDER — POTASSIUM CHLORIDE CRYS ER 20 MEQ PO TBCR: 20.0000 meq | EXTENDED_RELEASE_TABLET | Freq: Every day | ORAL | 6 refills | Status: DC

## 2016-05-18 MED ORDER — SACUBITRIL-VALSARTAN 49-51 MG PO TABS: 1.0000 | ORAL_TABLET | Freq: Two times a day (BID) | ORAL | 6 refills | Status: DC

## 2016-05-18 MED ORDER — WARFARIN SODIUM 10 MG PO TABS: 10.0000 mg | ORAL_TABLET | Freq: Once | ORAL | Status: DC

## 2016-05-18 MED FILL — Medication: Qty: 1 | Status: AC

## 2016-05-18 NOTE — Progress Notes (Signed)
ANTICOAGULATION CONSULT NOTE - Follow Up Consult  Pharmacy Consult for Lovenox and Coumadin Indication: mural thrombus  Allergies  Allergen Reactions  . Bacitracin Rash    Patient Measurements: Height: 6' (182.9 cm) Weight: 149 lb (67.6 kg) IBW/kg (Calculated) : 77.6  Vital Signs: Temp: 98.2 F (36.8 C) (03/17 0755) Temp Source: Oral (03/17 0755) BP: 106/74 (03/17 0755) Pulse Rate: 109 (03/17 0755)  Labs:  Recent Labs  05/16/16 0515 05/17/16 0600 05/17/16 0900 05/18/16 0500  HGB 9.8* 9.8*  --  9.7*  HCT 33.3* 33.2*  --  32.6*  PLT 323 316  --  359  LABPROT 15.1  --  15.0 16.2*  INR 1.18  --  1.17 1.29  HEPARINUNFRC 0.37  --  0.25*  --   CREATININE 1.21 1.08  --  0.95    Estimated Creatinine Clearance: 100.8 mL/min (by C-G formula based on SCr of 0.95 mg/dL).   Medications:  Lovenox @ 100 units Q24H (1.5mg /kg q24)  Assessment: 40yom on Lovenox bridge to coumadin for mural thrombus. INR 1.17 after two doses of coumadin 7.5mg , 10mg  given and INR now with slight trend up 1.29. CBC stable. No bleeding. Case management consult ordered to assist with cost of Lovenox.  Goal of Therapy:  INR 2-3 Monitor platelets by anticoagulation protocol: Yes   Plan:  1) Continue Lovenox 100 units q24h 2) Coumadin 10mg *1 today 3) Daily heparin level, INR, CBC  Recommendation: Continue Coumadin 10mg  daily, check INR as soon as possible next week.  Sherron Monday, PharmD Clinical Pharmacy Resident Pager: [phone] 05/18/16 8:11 AM

## 2016-05-18 NOTE — Progress Notes (Signed)
Advanced Heart Failure Team Rounding Note   Reason for consult: Cardiogenic shock  Consulting MD: Mcdowell  HPI:     Admitted with new onset HF. Echo LVEF 10% with large apical thrombus. RV moderate to severe HK. Severe TR. Drinks about 1 case of beer per day + liquor. Smokes 0.5-1ppd.  Coox 67.4%-> 60% this am off milrinone. K 4.1. Creatinine stable at 0.95 CVP 6  Feels good. Denies SOB. Wants to go home. Willing to do home lovenox. (has done it before). INR 1.3  Objective:    Vital Signs:   Temp:  [97.7 F (36.5 C)-98.9 F (37.2 C)] 98.5 F (36.9 C) (03/17 1151) Pulse Rate:  [101-109] 106 (03/17 1005) Resp:  [17-26] 20 (03/17 1151) BP: (90-136)/(60-88) 90/60 (03/17 1151) SpO2:  [95 %-100 %] 98 % (03/17 1151) Weight:  [68.9 kg (151 lb 14.4 oz)] 68.9 kg (151 lb 14.4 oz) (03/17 0700) Last BM Date: 05/17/16 Filed Weights   05/15/16 0500 05/16/16 0300 05/18/16 0700  Weight: 67.8 kg (149 lb 7.6 oz) 67.6 kg (149 lb) 68.9 kg (151 lb 14.4 oz)    Physical Exam: CVP 6 General: NAD lying in bed.   NAD.    HEENT: Normal flat. Carotids 2+ bilat; no bruits. No thyromegaly or nodule noted.  Cor: PMI laterally displaced. Regular. Slightly tachy. + s3 gallop  2/6 TR/MR Lungs: CTAB, normal effort no wheeze or rhonch Abdomen: soft, NT, ND, no HSM. No bruits or masses. Good bowel sounds Extremities: no cyanosis, clubbing, rash. Warm. Multiple tattoos. No edema.  Neuro: alert & orientedx3, cranial nerves grossly intact. moves all 4 extremities w/o difficulty. Affect pleasant  Telemetry: Personally reviewed, Sinus tach 100-110s    Labs: Basic Metabolic Panel:  Recent Labs Lab 05/11/16 1745  05/14/16 0502 05/15/16 0513 05/16/16 0515 05/17/16 0600 05/18/16 0500  NA  --   < > 133* 132* 134* 134* 133*  K  --   < > 3.9 4.3 4.4 4.7 4.1  CL  --   < > 104 100* 101 101 104  CO2  --   < > 25 24 26 26 24   GLUCOSE  --   < > 138* 145* 113* 109* 115*  BUN  --   < > 12 11 17 15 12     CREATININE  --   < > 0.85 0.99 1.21 1.08 0.95  CALCIUM  --   < > 7.5* 8.2* 8.3* 8.5* 8.3*  MG 1.8  --  1.6* 2.0  --   --   --   < > = values in this interval not displayed.  Liver Function Tests:  Recent Labs Lab 05/11/16 1144 05/16/16 0515  AST 722* 104*  ALT 492* 160*  ALKPHOS 130* 101  BILITOT 0.9 0.6  PROT 8.2* 8.9*  ALBUMIN 2.0* 2.4*   No results for input(s): LIPASE, AMYLASE in the last 168 hours. No results for input(s): AMMONIA in the last 168 hours.  CBC:  Recent Labs Lab 05/14/16 0502 05/15/16 0513 05/16/16 0515 05/17/16 0600 05/18/16 0500  WBC 8.4 9.9 10.0 8.8 8.5  HGB 9.3* 10.2* 9.8* 9.8* 9.7*  HCT 31.0* 33.8* 33.3* 33.2* 32.6*  MCV 78.5 79.2 79.5 80.6 80.1  PLT 278 338 323 316 359    Cardiac Enzymes: No results for input(s): CKTOTAL, CKMB, CKMBINDEX, TROPONINI in the last 168 hours.  BNP: BNP (last 3 results)  Recent Labs  05/10/16 1212  BNP 1,543.0*    ProBNP (last 3 results) No results  for input(s): PROBNP in the last 8760 hours.   CBG: No results for input(s): GLUCAP in the last 168 hours.  Coagulation Studies:  Recent Labs  05/15/16 1825 05/16/16 0515 05/17/16 0900 05/18/16 0500  LABPROT 15.6* 15.1 15.0 16.2*  INR 1.23 1.18 1.17 1.29    Other results:   Imaging: No results found.  Scheduled medications . digoxin  0.125 mg Oral Daily  . enoxaparin (LOVENOX) injection  1.5 mg/kg Subcutaneous Q24H  . folic acid  1 mg Oral Daily  . ivabradine  5 mg Oral BID WC  . multivitamin with minerals  1 tablet Oral Daily  . potassium chloride  20 mEq Oral Daily  . sacubitril-valsartan  1 tablet Oral BID  . sodium chloride flush  3 mL Intravenous Q12H  . sodium chloride flush  3 mL Intravenous Q12H  . spironolactone  25 mg Oral Daily  . thiamine  100 mg Oral Daily  . warfarin  10 mg Oral ONCE-1800  . Warfarin - Pharmacist Dosing Inpatient   Does not apply q1800   Infusion meds  PRN meds sodium chloride, sodium chloride,  acetaminophen, benzonatate, LORazepam, ondansetron (ZOFRAN) IV, sodium chloride flush, sodium chloride flush, sodium chloride flush, zolpidem   Assessment:   1. Cardiogenic shock 2. 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 Large LV thrombus 4. Shock liver 5. CKD, 3   --renal u/s shows bilateral medico-renal disease 6. Iron deficient anemia 7. ETOH abuse    -- had been drinking about 1 case beer + liquor daily 8. Ongoing tobacco use     --0.5-1ppd 9. Hydradrenitis suppurtivae    --s/p excision 1/18    --wound care now following  Plan/Discussion:    Co-ox down slightly but overall stable. Will plan d/c home today on current meds and lovenox bridge for LV thrombus. Stressed need to stay in close contact with [place] if he begins to deteriorate. Understands need to abstain totally from ETOH. Pull PICC prior to d/c. D/w SW and Care management.   D/c Meds  Digoxin 0.125 daily corlanor 5 bid Cleda Daub 25 daily entresto 49/51 bid Lasix 40 mg daily Kcl 20 mg daily Warfarin Lovenox until INR >= 2.0    Skyleigh Windle,MD 12:26 PM   Length of Stay: 8 Arvilla Meres, MD  05/18/2016, 12:26 PM  Advanced Heart Failure Team Pager [phone] (M-F; 7a - 4p)  Please contact [facility] for night-coverage after hours (4p -7a ) and weekends on [email or web address]

## 2016-05-18 NOTE — Progress Notes (Signed)
[id number] Patient states he walked earlier with Physical Therapy. Provided CHF patient information packet and discussed s/sx's of HF, daily weights, and when to call MD, discussed risk factor modification including low sodium diet, tobacco cessation, and activity progression. Patient was drowsy during education but acknowledged some of the information provided, needs reinforcement. Artist Pais, MS, ACSM CEP

## 2016-05-18 NOTE — Evaluation (Signed)
Physical Therapy Evaluation Patient Details Name: Paul Hahn MRN: [id number] DOB: April 16, 1975 Today's Date: 05/18/2016   History of Present Illness  Pt is a 41 y/o male admitted secondary to dyspnea recently d/c'd on 05/04/16 from [facility] with pneumonia. Pt found to have an EF of 15-20% of unknown etiology. Pt is a current ETOH abuse.  Clinical Impression  Pt presented supine in bed with HOB elevated, awake and willing to participate in therapy session. Prior to admission, pt reported that he was independent with all functional mobility and ADLs. Pt currently performing all mobility at mod I level. During ambulation pt's HR maintained in low 100's (baseline HR at rest 100-110 bpm). Pt with no concerns throughout. No further acute PT needs identified at this time. PT signing off.     Follow Up Recommendations No PT follow up    Equipment Recommendations  None recommended by PT    Recommendations for Other Services       Precautions / Restrictions Precautions Precautions: None Restrictions Weight Bearing Restrictions: No      Mobility  Bed Mobility Overal bed mobility: Independent                Transfers Overall transfer level: Modified independent Equipment used: None             General transfer comment: no instability with transition  Ambulation/Gait Ambulation/Gait assistance: Modified independent (Device/Increase time) Ambulation Distance (Feet): 300 Feet Assistive device:  (pushed IV pole) Gait Pattern/deviations: Step-through pattern Gait velocity: WFL Gait velocity interpretation: at or above normal speed for age/gender General Gait Details: no instability or LOB  Stairs            Wheelchair Mobility    Modified Rankin (Stroke Patients Only)       Balance Overall balance assessment: Needs assistance Sitting-balance support: Feet supported Sitting balance-Leahy Scale: Normal     Standing balance support: During  functional activity;No upper extremity supported Standing balance-Leahy Scale: Good                               Pertinent Vitals/Pain Pain Assessment: No/denies pain    Home Living Family/patient expects to be discharged to:: Private residence Living Arrangements: Parent Available Help at Discharge: Family;Available 24 hours/day   Home Access: Level entry     Home Layout: One level Home Equipment: Crutches;Shower seat      Prior Function Level of Independence: Independent               Hand Dominance        Extremity/Trunk Assessment   Upper Extremity Assessment Upper Extremity Assessment: Overall WFL for tasks assessed    Lower Extremity Assessment Lower Extremity Assessment: Overall WFL for tasks assessed    Cervical / Trunk Assessment Cervical / Trunk Assessment: Normal  Communication   Communication: No difficulties  Cognition Arousal/Alertness: Awake/alert Behavior During Therapy: WFL for tasks assessed/performed Overall Cognitive Status: Within Functional Limits for tasks assessed                      General Comments      Exercises     Assessment/Plan    PT Assessment Patent does not need any further PT services  PT Problem List         PT Treatment Interventions      PT Goals (Current goals can be found in the Care Plan section)  Acute Rehab PT  Goals Patient Stated Goal: return home today    Frequency     Barriers to discharge        Co-evaluation               End of Session   Activity Tolerance: Patient tolerated treatment well Patient left: in bed;with call bell/phone within reach Nurse Communication: Mobility status PT Visit Diagnosis: Other abnormalities of gait and mobility (R26.89)         Time: 9937-1696 PT Time Calculation (min) (ACUTE ONLY): 15 min   Charges:   PT Evaluation $PT Eval Low Complexity: 1 Procedure     PT G CodesAlessandra Bevels Tylin Force 05/18/2016, 9:59  AM Deborah Chalk, PT, DPT [phone]

## 2016-05-18 NOTE — Progress Notes (Signed)
Pt left without notifying RN. Discharge instruction copy not given. Spoke with his mom, advised to pick up medication from [place]. Discharge instructions and teaching were given prior to this incident. Pt took all his belongings with him. Discharge summary to mail to pt's home.

## 2016-05-18 NOTE — Progress Notes (Signed)
Lovenox teaching started. Pt claimed he has done it before.

## 2016-05-18 NOTE — Progress Notes (Signed)
CM received call from unit RN requesting CM assist with lovenox teaching and coverage.  Previous CM, Paul Hahn addressed the insurance coverage (please see previous note) and CM does not teach administration; bedside administration of lovenox is the bedside RN.  No other CM needs were communicated.

## 2016-05-19 ENCOUNTER — Telehealth: Payer: Self-pay | Admitting: Physician Assistant

## 2016-05-19 ENCOUNTER — Other Ambulatory Visit: Payer: Self-pay | Admitting: Physician Assistant

## 2016-05-19 MED ORDER — LOSARTAN POTASSIUM 50 MG PO TABS: 50.0000 mg | ORAL_TABLET | Freq: Every day | ORAL | 3 refills | Status: DC

## 2016-05-19 NOTE — Progress Notes (Signed)
Patient called on the phone because [name] is not covered by his insurance company and he cannot afford it.  Advised the patient that I could not talk to the insurance company over the weekend, but I could send him in a prescription for half of the [facility] which is the losartan. I sent in a prescription for losartan at 50 mg daily.

## 2016-05-19 NOTE — Telephone Encounter (Signed)
Patient called on the phone because [name] is not covered by his insurance company and he cannot afford it.  Advised the patient that I could not talk to the insurance company over the weekend, but I could send him in a prescription for half of the [facility] which is the losartan. I sent in a prescription for losartan at 50 mg daily.  Oral route this to the CHF team so they can work on getting insurance coverage for the [place] next week.

## 2016-05-20 ENCOUNTER — Telehealth (HOSPITAL_COMMUNITY): Payer: Self-pay | Admitting: Student

## 2016-05-20 ENCOUNTER — Other Ambulatory Visit (HOSPITAL_COMMUNITY): Payer: Self-pay | Admitting: Pharmacist

## 2016-05-20 MED ORDER — SACUBITRIL-VALSARTAN 49-51 MG PO TABS: 1.0000 | ORAL_TABLET | Freq: Two times a day (BID) | ORAL | 6 refills | Status: DC

## 2016-05-20 NOTE — Telephone Encounter (Signed)
Will take care of. Thanks!

## 2016-05-20 NOTE — Telephone Encounter (Addendum)
Entresto 49-51 mg BID PA approved by [employer] commercial through 05/20/17. Relayed info to Mr. Fillers and also advised him that he could use the $10 copay card once activated. Also advised him to STOP losartan and switch to [facility] once he is able to pick it up from the pharmacy.   Tyler Deis. Bonnye Fava, PharmD, BCPS, CPP Clinical Pharmacist Pager: [phone] Phone: [phone] 05/20/2016 9:32 AM

## 2016-05-20 NOTE — Telephone Encounter (Signed)
Could you please work on getting this gentlemen Entresto???  Case management should have given him 30 day card, so not sure what happened.    Also needs a coumadin appt, which I will be working on .  Thanks!  Casimiro Needle [address] [place], PA-C 05/20/2016 7:35 AM

## 2016-05-20 NOTE — Telephone Encounter (Signed)
Spoke with pt.  I have scheduled him an appointment with his PCP, Dr. Damaris Schooner in [place], [place] for INR follow up at 1:15 pm 05/21/16. Pt verbalizes understanding.  Pt also given fax number to send his FMLA papers.  Pt HOME number incorrect in chart, discussed with patient.  Correct home number is [phone].  MOBILE number is correct.  Paul Hahn [address] [place], PA-C 05/20/2016 8:51 AM

## 2016-05-22 ENCOUNTER — Encounter (HOSPITAL_COMMUNITY): Payer: Self-pay | Admitting: *Deleted

## 2016-05-22 NOTE — Progress Notes (Signed)
Paperwork for disability for patient from [employer] received in our clinic on 05/20/2016, claim # [id number]. Requested form/documents completed and faxed back today to [phone] as requested.  Original documents will be scanned to patient's electronic file.

## 2016-05-27 ENCOUNTER — Encounter (HOSPITAL_COMMUNITY): Payer: BLUE CROSS/BLUE SHIELD

## 2016-06-03 ENCOUNTER — Ambulatory Visit (HOSPITAL_COMMUNITY)
Admission: RE | Admit: 2016-06-03 | Discharge: 2016-06-03 | Disposition: A | Discharge status: Home / Self Care | Payer: BLUE CROSS/BLUE SHIELD | Source: Ambulatory Visit | Attending: Internal Medicine | Admitting: Internal Medicine

## 2016-06-03 VITALS — BP 100/58 | HR 99 | Wt 162.5 lb

## 2016-06-03 DIAGNOSIS — I5023 Acute on chronic systolic (congestive) heart failure: Secondary | ICD-10-CM | POA: Insufficient documentation

## 2016-06-03 DIAGNOSIS — N183 Chronic kidney disease, stage 3 unspecified: Secondary | ICD-10-CM | POA: Insufficient documentation

## 2016-06-03 DIAGNOSIS — I513 Intracardiac thrombosis, not elsewhere classified: Secondary | ICD-10-CM

## 2016-06-03 DIAGNOSIS — I240 Acute coronary thrombosis not resulting in myocardial infarction: Secondary | ICD-10-CM | POA: Insufficient documentation

## 2016-06-03 DIAGNOSIS — I429 Cardiomyopathy, unspecified: Secondary | ICD-10-CM

## 2016-06-03 DIAGNOSIS — F1010 Alcohol abuse, uncomplicated: Secondary | ICD-10-CM | POA: Insufficient documentation

## 2016-06-03 DIAGNOSIS — I5082 Biventricular heart failure: Secondary | ICD-10-CM | POA: Diagnosis not present

## 2016-06-03 DIAGNOSIS — Z79899 Other long term (current) drug therapy: Secondary | ICD-10-CM | POA: Insufficient documentation

## 2016-06-03 DIAGNOSIS — Z720 Tobacco use: Secondary | ICD-10-CM | POA: Diagnosis not present

## 2016-06-03 DIAGNOSIS — I428 Other cardiomyopathies: Secondary | ICD-10-CM | POA: Diagnosis not present

## 2016-06-03 DIAGNOSIS — Z872 Personal history of diseases of the skin and subcutaneous tissue: Secondary | ICD-10-CM

## 2016-06-03 DIAGNOSIS — Z7901 Long term (current) use of anticoagulants: Secondary | ICD-10-CM | POA: Insufficient documentation

## 2016-06-03 DIAGNOSIS — I5022 Chronic systolic (congestive) heart failure: Secondary | ICD-10-CM | POA: Diagnosis present

## 2016-06-03 DIAGNOSIS — F17210 Nicotine dependence, cigarettes, uncomplicated: Secondary | ICD-10-CM | POA: Insufficient documentation

## 2016-06-03 LAB — BASIC METABOLIC PANEL
ANION GAP: 9 (ref 5–15)
BUN: 15 mg/dL (ref 6–20)
CALCIUM: 8.7 mg/dL — AB (ref 8.9–10.3)
CO2: 21 mmol/L — AB (ref 22–32)
CREATININE: 1.2 mg/dL (ref 0.61–1.24)
Chloride: 103 mmol/L (ref 101–111)
GFR calc Af Amer: >60 mL/min (ref >60)
Glucose, Bld: 108 mg/dL — ABNORMAL HIGH (ref 65–99)
Potassium: 4 mmol/L (ref 3.5–5.1)
Sodium: 133 mmol/L — ABNORMAL LOW (ref 135–145)

## 2016-06-03 LAB — CBC
HCT: 31.9 % — ABNORMAL LOW (ref 39.0–52.0)
Hemoglobin: 9.7 g/dL — ABNORMAL LOW (ref 13.0–17.0)
MCH: 23.5 pg — ABNORMAL LOW (ref 26.0–34.0)
MCHC: 30.4 g/dL (ref 30.0–36.0)
MCV: 77.4 fL — AB (ref 78.0–100.0)
PLATELETS: 428 10*3/uL — AB (ref 150–400)
RBC: 4.12 MIL/uL — AB (ref 4.22–5.81)
RDW: 19.4 % — ABNORMAL HIGH (ref 11.5–15.5)
WBC: 12.7 10*3/uL — ABNORMAL HIGH (ref 4.0–10.5)

## 2016-06-03 LAB — BRAIN NATRIURETIC PEPTIDE: B Natriuretic Peptide: 284.2 pg/mL — ABNORMAL HIGH (ref 0.0–100.0)

## 2016-06-03 MED ORDER — BENZONATATE 100 MG PO CAPS: 100.0000 mg | ORAL_CAPSULE | Freq: Three times a day (TID) | ORAL | 0 refills | Status: DC | PRN

## 2016-06-03 MED ORDER — IVABRADINE HCL 5 MG PO TABS: 7.5000 mg | ORAL_TABLET | Freq: Two times a day (BID) | ORAL | 6 refills | Status: DC

## 2016-06-03 NOTE — Patient Instructions (Signed)
Routine lab work today. Will notify you of abnormal results, otherwise no news is good news!  INCREASE Corlanor to 7.5 mg (1.5 tabs) twice daily.  Tessalon pearls 100 mg tablet:  Take 1 tab every 8 hours as needed for cough.  Follow up 3-4 weeks with Dr. Gala Romney and echocardiogram.  Do the following things EVERYDAY: 1) Weigh yourself in the morning before breakfast. Write it down and keep it in a log. 2) Take your medicines as prescribed 3) Eat low salt foods-Limit salt (sodium) to 2000 mg per day.  4) Stay as active as you can everyday 5) Limit all fluids for the day to less than 2 liters
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
## 2016-06-04 ENCOUNTER — Telehealth (HOSPITAL_COMMUNITY): Payer: Self-pay | Admitting: Pharmacist

## 2016-06-04 ENCOUNTER — Encounter (HOSPITAL_COMMUNITY): Payer: Self-pay | Admitting: *Deleted

## 2016-06-04 NOTE — Telephone Encounter (Signed)
Spoke with Paul Pikes, RN at [facility] to clarify Paul Hahn Coumadin dose. He was seen by Dr. Damaris Schooner on 3/27 at which time his INR was 1.5 and his dose was increased from alternating 10 mg and 5 mg to 10 mg daily. INR goal is 2-3. He has a follow up on 4/6.   Paul Hahn. Paul Hahn, PharmD, BCPS, CPP Clinical Pharmacist Pager: [phone] Phone: [phone] 06/04/2016 10:10 AM

## 2016-06-04 NOTE — Progress Notes (Signed)
Received medical records request from [employer] for patient's office visit from 06/03/2016.  Claim # [id number]  Requested medical records faxed to [employer] disability today to fax # [phone].  Original request will be sent to be scanned to patient's electronic medical record.

## 2016-06-19 ENCOUNTER — Encounter (HOSPITAL_COMMUNITY): Payer: Self-pay | Admitting: *Deleted

## 2016-06-19 ENCOUNTER — Emergency Department (HOSPITAL_COMMUNITY)
Admission: EM | Admit: 2016-06-19 | Discharge: 2016-06-19 | Disposition: A | Discharge status: Home / Self Care | Payer: BLUE CROSS/BLUE SHIELD | Attending: Emergency Medicine | Admitting: Emergency Medicine

## 2016-06-19 ENCOUNTER — Emergency Department (HOSPITAL_COMMUNITY): Payer: BLUE CROSS/BLUE SHIELD

## 2016-06-19 DIAGNOSIS — Z7901 Long term (current) use of anticoagulants: Secondary | ICD-10-CM | POA: Insufficient documentation

## 2016-06-19 DIAGNOSIS — R935 Abnormal findings on diagnostic imaging of other abdominal regions, including retroperitoneum: Secondary | ICD-10-CM | POA: Insufficient documentation

## 2016-06-19 DIAGNOSIS — Z79899 Other long term (current) drug therapy: Secondary | ICD-10-CM | POA: Diagnosis not present

## 2016-06-19 DIAGNOSIS — L0291 Cutaneous abscess, unspecified: Secondary | ICD-10-CM | POA: Diagnosis present

## 2016-06-19 DIAGNOSIS — Z87891 Personal history of nicotine dependence: Secondary | ICD-10-CM | POA: Insufficient documentation

## 2016-06-19 DIAGNOSIS — R791 Abnormal coagulation profile: Secondary | ICD-10-CM

## 2016-06-19 DIAGNOSIS — L0390 Cellulitis, unspecified: Secondary | ICD-10-CM | POA: Insufficient documentation

## 2016-06-19 DIAGNOSIS — N183 Chronic kidney disease, stage 3 (moderate): Secondary | ICD-10-CM | POA: Insufficient documentation

## 2016-06-19 DIAGNOSIS — I5022 Chronic systolic (congestive) heart failure: Secondary | ICD-10-CM | POA: Insufficient documentation

## 2016-06-19 LAB — URINALYSIS, ROUTINE W REFLEX MICROSCOPIC
Bacteria, UA: NONE SEEN
Bilirubin Urine: NEGATIVE
Glucose, UA: NEGATIVE mg/dL
Ketones, ur: NEGATIVE mg/dL
Leukocytes, UA: NEGATIVE
Nitrite: NEGATIVE
PH: 5 (ref 5.0–8.0)
PROTEIN: NEGATIVE mg/dL
Specific Gravity, Urine: >1.046 — ABNORMAL HIGH (ref 1.005–1.030)

## 2016-06-19 LAB — BASIC METABOLIC PANEL
ANION GAP: 8 (ref 5–15)
BUN: 21 mg/dL — AB (ref 6–20)
CALCIUM: 8.6 mg/dL — AB (ref 8.9–10.3)
CO2: 22 mmol/L (ref 22–32)
CREATININE: 1.49 mg/dL — AB (ref 0.61–1.24)
Chloride: 100 mmol/L — ABNORMAL LOW (ref 101–111)
GFR calc Af Amer: >60 mL/min (ref >60)
GFR, EST NON AFRICAN AMERICAN: 58 mL/min — AB (ref >60)
GLUCOSE: 127 mg/dL — AB (ref 65–99)
Potassium: 4 mmol/L (ref 3.5–5.1)
Sodium: 130 mmol/L — ABNORMAL LOW (ref 135–145)

## 2016-06-19 LAB — CBC WITH DIFFERENTIAL/PLATELET
Basophils Absolute: 0 10*3/uL (ref 0.0–0.1)
Basophils Relative: 0 %
EOS PCT: 1 %
Eosinophils Absolute: 0.2 10*3/uL (ref 0.0–0.7)
HEMATOCRIT: 30.3 % — AB (ref 39.0–52.0)
Hemoglobin: 9.6 g/dL — ABNORMAL LOW (ref 13.0–17.0)
LYMPHS PCT: 11 %
Lymphs Abs: 1.9 10*3/uL (ref 0.7–4.0)
MCH: 23.6 pg — ABNORMAL LOW (ref 26.0–34.0)
MCHC: 31.7 g/dL (ref 30.0–36.0)
MCV: 74.4 fL — AB (ref 78.0–100.0)
MONO ABS: 1.1 10*3/uL — AB (ref 0.1–1.0)
MONOS PCT: 6 %
NEUTROS ABS: 14.7 10*3/uL — AB (ref 1.7–7.7)
Neutrophils Relative %: 82 %
PLATELETS: 438 10*3/uL — AB (ref 150–400)
RBC: 4.07 MIL/uL — ABNORMAL LOW (ref 4.22–5.81)
RDW: 18.9 % — AB (ref 11.5–15.5)
WBC: 17.9 10*3/uL — ABNORMAL HIGH (ref 4.0–10.5)

## 2016-06-19 LAB — PROTIME-INR
INR: 6.44
Prothrombin Time: 58.6 seconds — ABNORMAL HIGH (ref 11.4–15.2)

## 2016-06-19 LAB — TYPE AND SCREEN
ABO/RH(D): O POS
Antibody Screen: NEGATIVE

## 2016-06-19 LAB — LACTIC ACID, PLASMA: LACTIC ACID, VENOUS: 1.2 mmol/L (ref 0.5–1.9)

## 2016-06-19 MED ORDER — SODIUM CHLORIDE 0.9 % IV BOLUS (SEPSIS)
500.0000 mL | Freq: Once | INTRAVENOUS | Status: AC
  Administered 2016-06-19: 500 mL via INTRAVENOUS

## 2016-06-19 MED ORDER — CEPHALEXIN 500 MG PO CAPS: 500.0000 mg | ORAL_CAPSULE | Freq: Four times a day (QID) | ORAL | 0 refills | Status: DC

## 2016-06-19 MED ORDER — MUPIROCIN CALCIUM 2 % NA OINT: TOPICAL_OINTMENT | NASAL | 1 refills | Status: DC

## 2016-06-19 MED ORDER — CHLORHEXIDINE GLUCONATE 2 % EX PADS: 2.0000 [IU] | MEDICATED_PAD | Freq: Every day | CUTANEOUS | 1 refills | Status: AC

## 2016-06-19 MED ORDER — VANCOMYCIN HCL IN DEXTROSE 1-5 GM/200ML-% IV SOLN
1000.0000 mg | Freq: Once | INTRAVENOUS | Status: AC
  Administered 2016-06-19: 1000 mg via INTRAVENOUS
  Filled 2016-06-19: qty 200

## 2016-06-19 MED ORDER — FENTANYL CITRATE (PF) 100 MCG/2ML IJ SOLN
50.0000 ug | Freq: Once | INTRAMUSCULAR | Status: AC
  Administered 2016-06-19: 50 ug via INTRAVENOUS
  Filled 2016-06-19: qty 2

## 2016-06-19 MED ORDER — PIPERACILLIN-TAZOBACTAM 3.375 G IVPB 30 MIN
3.3750 g | Freq: Once | INTRAVENOUS | Status: AC
  Administered 2016-06-19: 3.375 g via INTRAVENOUS
  Filled 2016-06-19: qty 50

## 2016-06-19 MED ORDER — IOPAMIDOL (ISOVUE-300) INJECTION 61%
100.0000 mL | Freq: Once | INTRAVENOUS | Status: AC | PRN
  Administered 2016-06-19: 100 mL via INTRAVENOUS

## 2016-06-19 MED ORDER — CHLORHEXIDINE GLUCONATE 0.12% ORAL RINSE (MEDLINE KIT): 15.0000 mL | Freq: Two times a day (BID) | OROMUCOSAL | 0 refills | Status: AC

## 2016-06-19 MED ORDER — SULFAMETHOXAZOLE-TRIMETHOPRIM 800-160 MG PO TABS: 1.0000 | ORAL_TABLET | Freq: Two times a day (BID) | ORAL | 0 refills | Status: DC

## 2016-06-19 MED ORDER — HYDROCODONE-ACETAMINOPHEN 5-325 MG PO TABS: 1.0000 | ORAL_TABLET | Freq: Four times a day (QID) | ORAL | 0 refills | Status: DC | PRN
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
## 2016-06-19 NOTE — ED Triage Notes (Signed)
Pt states he has chronic issues with abscess on his bottom. Pt was placed on a blood thinner one month ago because of a clot in his heart. Within the last two days the abscess on his bottom are leaking blood. Pt states this is constant and he can't get them to stop. Pt is alert and oriented with NAD noted.

## 2016-06-19 NOTE — ED Notes (Signed)
CRITICAL VALUE ALERT  Critical value received:  INR 9.44  Date of notification:  06/19/2016  Time of notification: 1936  Critical value read back: YES  Nurse who received alert:  Sharia Reeve, RN  MD notified made aware:  Dr. Clayborne Dana at [phone]

## 2016-06-19 NOTE — Discharge Instructions (Signed)
Please follow-up with your primary physicians to recheck your INR on Friday. Please alter Coumadin until that time. After that please follow their recommendations on how much and how often to take your Coumadin.

## 2016-06-20 ENCOUNTER — Telehealth (HOSPITAL_COMMUNITY): Payer: Self-pay | Admitting: *Deleted

## 2016-06-20 NOTE — Telephone Encounter (Signed)
Patient called and left VM on triage line yesterday reporting he was having some bleeding from his noise.  I tried calling him back but had to leave a VM asking for him to call me back.    I spoke with him this morning and he reported that he has a problem with sinus acne and he had some that had "puss like" drainage that had started bleeding yesterday and was unable to get it to stop.  His coumadin is monitored at his PCP office in [place] and she had recently changed his appts from weekly to every 3 weeks.  He went to [facility] Emergency room last [facility] at [facility] because the bleeding continued and wasand his INR was 6.44.  He has now been advised to hold coumadin and follow up with PCP next week to plan care for restarting coumadin.    He wanted Dr. Gala Romney to be aware and I told him I would forward message to him.  I also advised him to call us back after he sees his PCP if he would like Dr. Gala Romney to review care plan in restarting coumadin if needed. He was very thankful and no further questions at this time.

## 2016-06-24 LAB — CULTURE, BLOOD (ROUTINE X 2)
CULTURE: NO GROWTH
Culture: NO GROWTH
SPECIAL REQUESTS: ADEQUATE
Special Requests: ADEQUATE

## 2016-07-01 ENCOUNTER — Encounter (HOSPITAL_COMMUNITY): Payer: Self-pay | Admitting: *Deleted

## 2016-07-01 ENCOUNTER — Telehealth (HOSPITAL_COMMUNITY): Payer: Self-pay | Admitting: Vascular Surgery

## 2016-07-01 NOTE — Telephone Encounter (Signed)
Pt left message w/ answering service to confirm appt date and time, left pt detailed message about his appt/ w/ parking code

## 2016-07-01 NOTE — Progress Notes (Signed)
Received medical records request from [facility] [employer] asking for patient's next scheduled appointment and any new office notes.  Claim # [id number]  Requested scheduled appointment time faxed today but patient does not have any new office visit notes since last requested notes sent on 06/04/2016.  Original requested form will be scanned to patient's electronic medical record.

## 2016-07-03 ENCOUNTER — Ambulatory Visit (HOSPITAL_BASED_OUTPATIENT_CLINIC_OR_DEPARTMENT_OTHER)
Admission: RE | Admit: 2016-07-03 | Discharge: 2016-07-03 | Disposition: A | Discharge status: Home / Self Care | Payer: BLUE CROSS/BLUE SHIELD | Source: Ambulatory Visit | Attending: Internal Medicine | Admitting: Internal Medicine

## 2016-07-03 ENCOUNTER — Encounter (HOSPITAL_COMMUNITY): Payer: Self-pay | Admitting: *Deleted

## 2016-07-03 ENCOUNTER — Ambulatory Visit (HOSPITAL_COMMUNITY)
Admission: RE | Admit: 2016-07-03 | Discharge: 2016-07-03 | Disposition: A | Discharge status: Home / Self Care | Payer: BLUE CROSS/BLUE SHIELD | Source: Ambulatory Visit | Attending: Emergency Medicine | Admitting: Emergency Medicine

## 2016-07-03 ENCOUNTER — Encounter (HOSPITAL_COMMUNITY): Payer: Self-pay | Admitting: Internal Medicine

## 2016-07-03 VITALS — BP 132/76 | HR 87 | Wt 162.5 lb

## 2016-07-03 DIAGNOSIS — Z720 Tobacco use: Secondary | ICD-10-CM

## 2016-07-03 DIAGNOSIS — Z87891 Personal history of nicotine dependence: Secondary | ICD-10-CM | POA: Diagnosis not present

## 2016-07-03 DIAGNOSIS — I513 Intracardiac thrombosis, not elsewhere classified: Secondary | ICD-10-CM

## 2016-07-03 DIAGNOSIS — N183 Chronic kidney disease, stage 3 (moderate): Secondary | ICD-10-CM | POA: Insufficient documentation

## 2016-07-03 DIAGNOSIS — F1010 Alcohol abuse, uncomplicated: Secondary | ICD-10-CM | POA: Diagnosis not present

## 2016-07-03 DIAGNOSIS — Z79899 Other long term (current) drug therapy: Secondary | ICD-10-CM | POA: Insufficient documentation

## 2016-07-03 DIAGNOSIS — L732 Hidradenitis suppurativa: Secondary | ICD-10-CM | POA: Diagnosis not present

## 2016-07-03 DIAGNOSIS — I5082 Biventricular heart failure: Secondary | ICD-10-CM | POA: Diagnosis not present

## 2016-07-03 DIAGNOSIS — I5022 Chronic systolic (congestive) heart failure: Secondary | ICD-10-CM

## 2016-07-03 DIAGNOSIS — I429 Cardiomyopathy, unspecified: Secondary | ICD-10-CM | POA: Diagnosis not present

## 2016-07-03 DIAGNOSIS — Z7901 Long term (current) use of anticoagulants: Secondary | ICD-10-CM | POA: Diagnosis not present

## 2016-07-03 LAB — ECHOCARDIOGRAM COMPLETE
CHL CUP DOP CALC LVOT VTI: 16.8 cm
CHL CUP MV DEC (S): 145
CHL CUP TV REG PEAK VELOCITY: 231 cm/s
E decel time: 145 msec
E/e' ratio: 7.48
FS: 22 % — AB (ref 28–44)
IV/PV OW: 0.68
LA diam end sys: 36 mm
LA diam index: 1.91 cm/m2
LA vol: 62 mL
LASIZE: 36 mm
LAVOLA4C: 59.6 mL
LAVOLIN: 32.9 mL/m2
LDCA: 5.73 cm2
LV E/e'average: 7.48
LV TDI E'MEDIAL: 8.38
LV dias vol index: 89 mL/m2
LV sys vol: 105 mL — AB (ref 21–61)
LVDIAVOL: 167 mL — AB (ref 62–150)
LVEEMED: 7.48
LVELAT: 10.2 cm/s
LVOT peak grad rest: 4 mmHg
LVOT peak vel: 104 cm/s
LVOTD: 27 mm
LVOTSV: 96 mL
LVSYSVOLIN: 56 mL/m2
Lateral S' vel: 15.4 cm/s
MV pk A vel: 47.6 m/s
MV pk E vel: 76.3 m/s
MVPG: 2 mmHg
PW: 12 mm — AB (ref 0.6–1.1)
RV TAPSE: 22.8 mm
RV sys press: 24 mmHg
Simpson's disk: 37
Stroke v: 62 ml
TDI e' lateral: 10.2
TRMAXVEL: 231 cm/s

## 2016-07-03 LAB — BASIC METABOLIC PANEL
Anion gap: 8 (ref 5–15)
BUN: 14 mg/dL (ref 6–20)
CALCIUM: 8.7 mg/dL — AB (ref 8.9–10.3)
CHLORIDE: 104 mmol/L (ref 101–111)
CO2: 22 mmol/L (ref 22–32)
CREATININE: 1.26 mg/dL — AB (ref 0.61–1.24)
GFR calc non Af Amer: >60 mL/min (ref >60)
GLUCOSE: 89 mg/dL (ref 65–99)
Potassium: 4.1 mmol/L (ref 3.5–5.1)
Sodium: 134 mmol/L — ABNORMAL LOW (ref 135–145)

## 2016-07-03 LAB — BRAIN NATRIURETIC PEPTIDE: B Natriuretic Peptide: 95.3 pg/mL (ref 0.0–100.0)

## 2016-07-03 MED ORDER — SACUBITRIL-VALSARTAN 97-103 MG PO TABS: 1.0000 | ORAL_TABLET | Freq: Two times a day (BID) | ORAL | 3 refills | Status: DC

## 2016-07-03 MED ORDER — CARVEDILOL 3.125 MG PO TABS: 3.1250 mg | ORAL_TABLET | Freq: Two times a day (BID) | ORAL | 3 refills | Status: DC

## 2016-07-03 MED ORDER — FUROSEMIDE 40 MG PO TABS: 40.0000 mg | ORAL_TABLET | ORAL | 6 refills | Status: DC | PRN

## 2016-07-03 NOTE — Progress Notes (Signed)
  Echocardiogram 2D Echocardiogram has been performed.  Delcie Roch 07/03/2016, 8:49 AM

## 2016-07-03 NOTE — Patient Instructions (Signed)
Stop Losartan  Stop Potassium (k-dur)  Stop Warfarin  Stop Digoxin  Change Furosemide (Lasix) to AS NEEDED ONLY  Start Carvedilol 3.125 mg Twice daily   Increase Entresto to 97/103 mg Twice daily   Labs done today  Your physician recommends that you schedule a follow-up appointment in: 6-8 weeks
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
## 2016-07-04 ENCOUNTER — Encounter (HOSPITAL_COMMUNITY): Payer: Self-pay | Admitting: *Deleted

## 2016-07-04 NOTE — Progress Notes (Signed)
Received medical records request from [facility] [employer] asking for patient's next scheduled appointment and any new office notes. Claim # [id number]  Office notes with next scheduled appointment faxed today to [phone] as requested.  Original request will be scanned to patient's medical record.

## 2016-08-14 ENCOUNTER — Ambulatory Visit (HOSPITAL_COMMUNITY)
Admission: RE | Admit: 2016-08-14 | Discharge: 2016-08-14 | Disposition: A | Discharge status: Home / Self Care | Payer: BLUE CROSS/BLUE SHIELD | Source: Ambulatory Visit | Attending: Internal Medicine | Admitting: Internal Medicine

## 2016-08-14 ENCOUNTER — Encounter (HOSPITAL_COMMUNITY): Payer: Self-pay | Admitting: Internal Medicine

## 2016-08-14 VITALS — BP 130/82 | HR 68 | Wt 163.4 lb

## 2016-08-14 DIAGNOSIS — L732 Hidradenitis suppurativa: Secondary | ICD-10-CM | POA: Diagnosis not present

## 2016-08-14 DIAGNOSIS — F1010 Alcohol abuse, uncomplicated: Secondary | ICD-10-CM | POA: Diagnosis not present

## 2016-08-14 DIAGNOSIS — Z87891 Personal history of nicotine dependence: Secondary | ICD-10-CM | POA: Diagnosis not present

## 2016-08-14 DIAGNOSIS — Z79899 Other long term (current) drug therapy: Secondary | ICD-10-CM | POA: Diagnosis not present

## 2016-08-14 DIAGNOSIS — I513 Intracardiac thrombosis, not elsewhere classified: Secondary | ICD-10-CM | POA: Diagnosis not present

## 2016-08-14 DIAGNOSIS — N183 Chronic kidney disease, stage 3 unspecified: Secondary | ICD-10-CM

## 2016-08-14 DIAGNOSIS — I5022 Chronic systolic (congestive) heart failure: Secondary | ICD-10-CM | POA: Insufficient documentation

## 2016-08-14 DIAGNOSIS — I5082 Biventricular heart failure: Secondary | ICD-10-CM | POA: Diagnosis not present

## 2016-08-14 DIAGNOSIS — Z9889 Other specified postprocedural states: Secondary | ICD-10-CM | POA: Diagnosis not present

## 2016-08-14 DIAGNOSIS — I429 Cardiomyopathy, unspecified: Secondary | ICD-10-CM | POA: Diagnosis not present

## 2016-08-14 LAB — BASIC METABOLIC PANEL
Anion gap: 5 (ref 5–15)
BUN: 8 mg/dL (ref 6–20)
CHLORIDE: 103 mmol/L (ref 101–111)
CO2: 24 mmol/L (ref 22–32)
CREATININE: 1.04 mg/dL (ref 0.61–1.24)
Calcium: 8.6 mg/dL — ABNORMAL LOW (ref 8.9–10.3)
GFR calc non Af Amer: >60 mL/min (ref >60)
GLUCOSE: 107 mg/dL — AB (ref 65–99)
Potassium: 3.1 mmol/L — ABNORMAL LOW (ref 3.5–5.1)
Sodium: 132 mmol/L — ABNORMAL LOW (ref 135–145)

## 2016-08-14 MED ORDER — CARVEDILOL 6.25 MG PO TABS: 6.2500 mg | ORAL_TABLET | Freq: Two times a day (BID) | ORAL | 3 refills | Status: DC

## 2016-08-14 NOTE — Addendum Note (Signed)
Encounter addended by: Noralee Space, RN on: 08/14/2016  3:34 PM<BR>    Actions taken: Pharmacy for encounter modified, Order list changed
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
## 2016-08-14 NOTE — Patient Instructions (Signed)
Increase Carvedilol to 6.25 mg Twice daily   Labs today  Your physician recommends that you schedule a follow-up appointment in: 8 weeks

## 2016-08-16 ENCOUNTER — Telehealth (HOSPITAL_COMMUNITY): Payer: Self-pay | Admitting: *Deleted

## 2016-08-16 MED ORDER — POTASSIUM CHLORIDE CRYS ER 20 MEQ PO TBCR: 40.0000 meq | EXTENDED_RELEASE_TABLET | Freq: Every day | ORAL | 6 refills | Status: DC

## 2016-08-16 NOTE — Telephone Encounter (Signed)
Notes recorded by Noralee Space, RN on 08/16/2016 at 12:28 PM EDT Pt is aware and agreeable, rx sent in for KCL, rx for repeat bmet in 1 week has been mailed to pt so he can have done locally. ------  Notes recorded by Noralee Space, RN on 08/15/2016 at 4:28 PM EDT Per Tonye Becket, NP pt needs to start KCL 40 meq daily, attempted to call pt and Left message to call back on both numbers

## 2016-09-03 ENCOUNTER — Telehealth (HOSPITAL_COMMUNITY): Payer: Self-pay | Admitting: Pharmacist

## 2016-09-03 NOTE — Telephone Encounter (Signed)
Paul Hahn called stating that his [name] $44 copay card is not longer working since his Entresto dose was increased to 97-103 mg BID. I have called the pharmacy and confirmed that it is going through his insurance for $50 but the copay card is saying that the product is not eligible. I have advised [name] to call [facility] to see if they can help him or try to activate a new card through [email or web address] since he does not live close enough to the clinic to come by and get a new card.   Tyler Deis. Bonnye Fava, PharmD, BCPS, CPP Clinical Pharmacist Pager: [phone] Phone: [phone] 09/03/2016 2:07 PM

## 2016-10-15 ENCOUNTER — Encounter (HOSPITAL_COMMUNITY): Payer: Self-pay

## 2016-10-15 ENCOUNTER — Ambulatory Visit (HOSPITAL_COMMUNITY)
Admission: RE | Admit: 2016-10-15 | Discharge: 2016-10-15 | Disposition: A | Discharge status: Home / Self Care | Payer: BLUE CROSS/BLUE SHIELD | Source: Ambulatory Visit | Attending: Cardiology | Admitting: Cardiology

## 2016-10-15 VITALS — BP 130/74 | HR 81 | Wt 163.0 lb

## 2016-10-15 DIAGNOSIS — Z8781 Personal history of (healed) traumatic fracture: Secondary | ICD-10-CM | POA: Insufficient documentation

## 2016-10-15 DIAGNOSIS — N183 Chronic kidney disease, stage 3 unspecified: Secondary | ICD-10-CM

## 2016-10-15 DIAGNOSIS — Z8249 Family history of ischemic heart disease and other diseases of the circulatory system: Secondary | ICD-10-CM | POA: Insufficient documentation

## 2016-10-15 DIAGNOSIS — Z87891 Personal history of nicotine dependence: Secondary | ICD-10-CM | POA: Insufficient documentation

## 2016-10-15 DIAGNOSIS — L732 Hidradenitis suppurativa: Secondary | ICD-10-CM | POA: Diagnosis not present

## 2016-10-15 DIAGNOSIS — I428 Other cardiomyopathies: Secondary | ICD-10-CM | POA: Diagnosis not present

## 2016-10-15 DIAGNOSIS — I513 Intracardiac thrombosis, not elsewhere classified: Secondary | ICD-10-CM

## 2016-10-15 DIAGNOSIS — Z79899 Other long term (current) drug therapy: Secondary | ICD-10-CM | POA: Diagnosis not present

## 2016-10-15 DIAGNOSIS — F1010 Alcohol abuse, uncomplicated: Secondary | ICD-10-CM | POA: Diagnosis not present

## 2016-10-15 DIAGNOSIS — I5082 Biventricular heart failure: Secondary | ICD-10-CM | POA: Diagnosis not present

## 2016-10-15 DIAGNOSIS — Z720 Tobacco use: Secondary | ICD-10-CM | POA: Diagnosis not present

## 2016-10-15 DIAGNOSIS — I5022 Chronic systolic (congestive) heart failure: Secondary | ICD-10-CM | POA: Insufficient documentation

## 2016-10-15 LAB — BASIC METABOLIC PANEL
Anion gap: 5 (ref 5–15)
BUN: 12 mg/dL (ref 6–20)
CALCIUM: 8.5 mg/dL — AB (ref 8.9–10.3)
CHLORIDE: 102 mmol/L (ref 101–111)
CO2: 26 mmol/L (ref 22–32)
CREATININE: 1 mg/dL (ref 0.61–1.24)
GFR calc Af Amer: >60 mL/min (ref >60)
GFR calc non Af Amer: >60 mL/min (ref >60)
Glucose, Bld: 93 mg/dL (ref 65–99)
Potassium: 4 mmol/L (ref 3.5–5.1)
Sodium: 133 mmol/L — ABNORMAL LOW (ref 135–145)

## 2016-10-15 NOTE — Patient Instructions (Signed)
Lab today  Your physician recommends that you schedule a follow-up appointment in: 3 months with echocardiogram
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
## 2016-10-15 NOTE — Progress Notes (Signed)
Advanced Heart Failure Medication Review by a Pharmacist  Does the patient  feel that his/her medications are working for him/her?  yes  Has the patient been experiencing any side effects to the medications prescribed?  no  Does the patient measure his/her own blood pressure or blood glucose at home?  no   Does the patient have any problems obtaining medications due to transportation or finances?   No - now has Entresto copay card  Understanding of regimen: good Understanding of indications: good Potential of compliance: good Patient understands to avoid NSAIDs. Patient understands to avoid decongestants.  Issues to address at subsequent visits: None   Pharmacist comments: Paul Hahn is a 41 yo M presenting without a medication list but with fair recall of his regimen. He reports good compliance with his regimen and has not needed any PRN furosemide recently. He did not have any specific medication-related questions or concerns for me at this time.   Tyler Deis. Bonnye Fava, PharmD, BCPS, CPP Clinical Pharmacist Pager: [phone] Phone: [phone] 10/15/2016 3:14 PM      Time with patient: 10 minutes Preparation and documentation time: 2 minutes Total time: 12 minutes

## 2016-10-27 ENCOUNTER — Other Ambulatory Visit (HOSPITAL_COMMUNITY): Payer: Self-pay | Admitting: Internal Medicine

## 2016-12-16 ENCOUNTER — Telehealth (HOSPITAL_COMMUNITY): Payer: Self-pay | Admitting: *Deleted

## 2016-12-16 NOTE — Telephone Encounter (Signed)
Advanced Heart Failure Triage Encounter  Patient Name: Paul Hahn  Date of Call: 12/16/16  Problem:  Patient called saying he was unable to refill his corlanor because his assistance had expired.  He was getting it for $10.00.    Plan:  Will send to Elizabeth Palau to review and call patient back.   Georgina Peer, RN

## 2016-12-24 ENCOUNTER — Other Ambulatory Visit (HOSPITAL_COMMUNITY): Payer: Self-pay | Admitting: Pharmacist

## 2016-12-24 MED ORDER — IVABRADINE HCL 7.5 MG PO TABS: 7.5000 mg | ORAL_TABLET | Freq: Two times a day (BID) | ORAL | 5 refills | Status: DC

## 2016-12-24 NOTE — Telephone Encounter (Signed)
Corlanor 7.5 mg BID PA approved by [facility] [employer] through 12/24/17.   ID: [id number] BIN: [id number] GRPGlyn Ade PA phone #: [phone]  Paul Hahn. Bonnye Fava, PharmD, BCPS, CPP Clinical Pharmacist Pager: [phone] Phone: [phone] 12/24/2016 2:53 PM

## 2017-01-11 ENCOUNTER — Other Ambulatory Visit: Payer: Self-pay | Admitting: Physician Assistant

## 2017-01-16 ENCOUNTER — Other Ambulatory Visit: Payer: Self-pay

## 2017-01-16 ENCOUNTER — Ambulatory Visit (HOSPITAL_COMMUNITY)
Admission: RE | Admit: 2017-01-16 | Discharge: 2017-01-16 | Disposition: A | Discharge status: Home / Self Care | Payer: BLUE CROSS/BLUE SHIELD | Source: Ambulatory Visit | Attending: Internal Medicine | Admitting: Internal Medicine

## 2017-01-16 ENCOUNTER — Ambulatory Visit (HOSPITAL_BASED_OUTPATIENT_CLINIC_OR_DEPARTMENT_OTHER)
Admission: RE | Admit: 2017-01-16 | Discharge: 2017-01-16 | Disposition: A | Discharge status: Home / Self Care | Payer: BLUE CROSS/BLUE SHIELD | Source: Ambulatory Visit | Attending: Internal Medicine | Admitting: Internal Medicine

## 2017-01-16 VITALS — BP 146/83 | HR 92 | Wt 163.2 lb

## 2017-01-16 DIAGNOSIS — I5022 Chronic systolic (congestive) heart failure: Secondary | ICD-10-CM | POA: Insufficient documentation

## 2017-01-16 DIAGNOSIS — I428 Other cardiomyopathies: Secondary | ICD-10-CM | POA: Insufficient documentation

## 2017-01-16 DIAGNOSIS — I5082 Biventricular heart failure: Secondary | ICD-10-CM | POA: Insufficient documentation

## 2017-01-16 DIAGNOSIS — N183 Chronic kidney disease, stage 3 (moderate): Secondary | ICD-10-CM | POA: Insufficient documentation

## 2017-01-16 DIAGNOSIS — F1010 Alcohol abuse, uncomplicated: Secondary | ICD-10-CM | POA: Diagnosis not present

## 2017-01-16 DIAGNOSIS — Z872 Personal history of diseases of the skin and subcutaneous tissue: Secondary | ICD-10-CM

## 2017-01-16 DIAGNOSIS — Z9889 Other specified postprocedural states: Secondary | ICD-10-CM | POA: Diagnosis not present

## 2017-01-16 DIAGNOSIS — Z79899 Other long term (current) drug therapy: Secondary | ICD-10-CM | POA: Insufficient documentation

## 2017-01-16 DIAGNOSIS — I517 Cardiomegaly: Secondary | ICD-10-CM | POA: Insufficient documentation

## 2017-01-16 DIAGNOSIS — Z87891 Personal history of nicotine dependence: Secondary | ICD-10-CM | POA: Insufficient documentation

## 2017-01-16 DIAGNOSIS — L732 Hidradenitis suppurativa: Secondary | ICD-10-CM | POA: Diagnosis not present

## 2017-01-16 MED ORDER — CEPHALEXIN 500 MG PO CAPS: 500.0000 mg | ORAL_CAPSULE | Freq: Three times a day (TID) | ORAL | 0 refills | Status: DC

## 2017-01-16 MED ORDER — ISOSORB DINITRATE-HYDRALAZINE 20-37.5 MG PO TABS: 0.5000 | ORAL_TABLET | Freq: Three times a day (TID) | ORAL | 6 refills | Status: DC
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
## 2017-01-16 NOTE — Progress Notes (Signed)
  Echocardiogram 2D Echocardiogram has been performed.  Paul Hahn F 01/16/2017, 3:12 PM

## 2017-01-16 NOTE — Patient Instructions (Signed)
Stop Corlanor  Start Keflex 500 mg Three times a day for 14 days  Start Bidil 1/2 tab Three times a day   Your physician recommends that you schedule a follow-up appointment in: 3 months

## 2017-01-20 ENCOUNTER — Other Ambulatory Visit (HOSPITAL_COMMUNITY): Payer: Self-pay | Admitting: *Deleted

## 2017-01-20 MED ORDER — CEPHALEXIN 500 MG PO CAPS: 500.0000 mg | ORAL_CAPSULE | Freq: Three times a day (TID) | ORAL | 0 refills | Status: AC

## 2017-01-20 MED ORDER — ISOSORB DINITRATE-HYDRALAZINE 20-37.5 MG PO TABS: 0.5000 | ORAL_TABLET | Freq: Three times a day (TID) | ORAL | 6 refills | Status: DC

## 2017-04-10 ENCOUNTER — Ambulatory Visit (HOSPITAL_COMMUNITY)
Admission: RE | Admit: 2017-04-10 | Discharge: 2017-04-10 | Disposition: A | Discharge status: Home / Self Care | Payer: BLUE CROSS/BLUE SHIELD | Source: Ambulatory Visit | Attending: Internal Medicine | Admitting: Internal Medicine

## 2017-04-10 ENCOUNTER — Encounter (HOSPITAL_COMMUNITY): Payer: Self-pay

## 2017-04-10 VITALS — BP 136/68 | HR 99 | Wt 164.0 lb

## 2017-04-10 DIAGNOSIS — I429 Cardiomyopathy, unspecified: Secondary | ICD-10-CM

## 2017-04-10 DIAGNOSIS — L732 Hidradenitis suppurativa: Secondary | ICD-10-CM | POA: Insufficient documentation

## 2017-04-10 DIAGNOSIS — I5082 Biventricular heart failure: Secondary | ICD-10-CM | POA: Diagnosis not present

## 2017-04-10 DIAGNOSIS — Z79899 Other long term (current) drug therapy: Secondary | ICD-10-CM | POA: Insufficient documentation

## 2017-04-10 DIAGNOSIS — Z87891 Personal history of nicotine dependence: Secondary | ICD-10-CM | POA: Diagnosis not present

## 2017-04-10 DIAGNOSIS — Z872 Personal history of diseases of the skin and subcutaneous tissue: Secondary | ICD-10-CM

## 2017-04-10 DIAGNOSIS — I5022 Chronic systolic (congestive) heart failure: Secondary | ICD-10-CM

## 2017-04-10 DIAGNOSIS — N183 Chronic kidney disease, stage 3 unspecified: Secondary | ICD-10-CM

## 2017-04-10 DIAGNOSIS — Z720 Tobacco use: Secondary | ICD-10-CM

## 2017-04-10 DIAGNOSIS — F1010 Alcohol abuse, uncomplicated: Secondary | ICD-10-CM | POA: Diagnosis not present

## 2017-04-10 LAB — BASIC METABOLIC PANEL
Anion gap: 8 (ref 5–15)
BUN: 10 mg/dL (ref 6–20)
CALCIUM: 8.2 mg/dL — AB (ref 8.9–10.3)
CO2: 24 mmol/L (ref 22–32)
Chloride: 105 mmol/L (ref 101–111)
Creatinine, Ser: 1.13 mg/dL (ref 0.61–1.24)
GFR calc Af Amer: >60 mL/min (ref >60)
GFR calc non Af Amer: >60 mL/min (ref >60)
GLUCOSE: 86 mg/dL (ref 65–99)
Potassium: 3.6 mmol/L (ref 3.5–5.1)
Sodium: 137 mmol/L (ref 135–145)

## 2017-04-10 MED ORDER — CARVEDILOL 12.5 MG PO TABS: 12.5000 mg | ORAL_TABLET | Freq: Two times a day (BID) | ORAL | 11 refills | Status: DC

## 2017-04-10 NOTE — Patient Instructions (Signed)
Routine lab work today. Will notify you of abnormal results, otherwise no news is good news!  INCREASE Carvedilol (Coreg) to 12.5 mg twice daily. Can double up on current 6.25 mg tablets you have at home (Take 2 tabs twice daily). New Rx has been sent to your pharmacy for 12.5 mg tablets (Take 1 tab twice daily).  Follow up 3 months with Dr. Gala Romney.  Take all medication as prescribed the day of your appointment. Bring all medications with you to your appointment.  Do the following things EVERYDAY: 1) Weigh yourself in the morning before breakfast. Write it down and keep it in a log. 2) Take your medicines as prescribed 3) Eat low salt foods-Limit salt (sodium) to 2000 mg per day.  4) Stay as active as you can everyday 5) Limit all fluids for the day to less than 2 liters
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
## 2017-04-22 ENCOUNTER — Inpatient Hospital Stay (HOSPITAL_COMMUNITY)
Admission: EM | Admit: 2017-04-22 | Discharge: 2017-04-25 | DRG: 292 | Disposition: A | Discharge status: Home / Self Care | Payer: BLUE CROSS/BLUE SHIELD | Attending: Internal Medicine | Admitting: Internal Medicine

## 2017-04-22 ENCOUNTER — Other Ambulatory Visit: Payer: Self-pay

## 2017-04-22 ENCOUNTER — Encounter (HOSPITAL_COMMUNITY): Payer: Self-pay | Admitting: Emergency Medicine

## 2017-04-22 ENCOUNTER — Emergency Department (HOSPITAL_COMMUNITY): Payer: BLUE CROSS/BLUE SHIELD

## 2017-04-22 DIAGNOSIS — I509 Heart failure, unspecified: Secondary | ICD-10-CM | POA: Diagnosis not present

## 2017-04-22 DIAGNOSIS — Z79899 Other long term (current) drug therapy: Secondary | ICD-10-CM

## 2017-04-22 DIAGNOSIS — I5022 Chronic systolic (congestive) heart failure: Secondary | ICD-10-CM | POA: Diagnosis present

## 2017-04-22 DIAGNOSIS — E876 Hypokalemia: Secondary | ICD-10-CM | POA: Diagnosis not present

## 2017-04-22 DIAGNOSIS — I361 Nonrheumatic tricuspid (valve) insufficiency: Secondary | ICD-10-CM | POA: Diagnosis not present

## 2017-04-22 DIAGNOSIS — D649 Anemia, unspecified: Secondary | ICD-10-CM | POA: Diagnosis present

## 2017-04-22 DIAGNOSIS — F17210 Nicotine dependence, cigarettes, uncomplicated: Secondary | ICD-10-CM | POA: Diagnosis present

## 2017-04-22 DIAGNOSIS — I5023 Acute on chronic systolic (congestive) heart failure: Secondary | ICD-10-CM | POA: Diagnosis present

## 2017-04-22 DIAGNOSIS — Z720 Tobacco use: Secondary | ICD-10-CM | POA: Diagnosis present

## 2017-04-22 DIAGNOSIS — F1010 Alcohol abuse, uncomplicated: Secondary | ICD-10-CM | POA: Diagnosis present

## 2017-04-22 DIAGNOSIS — D509 Iron deficiency anemia, unspecified: Secondary | ICD-10-CM | POA: Diagnosis present

## 2017-04-22 DIAGNOSIS — I340 Nonrheumatic mitral (valve) insufficiency: Secondary | ICD-10-CM | POA: Diagnosis not present

## 2017-04-22 DIAGNOSIS — N183 Chronic kidney disease, stage 3 unspecified: Secondary | ICD-10-CM | POA: Diagnosis present

## 2017-04-22 DIAGNOSIS — Z9114 Patient's other noncompliance with medication regimen: Secondary | ICD-10-CM | POA: Diagnosis not present

## 2017-04-22 DIAGNOSIS — I429 Cardiomyopathy, unspecified: Secondary | ICD-10-CM | POA: Diagnosis present

## 2017-04-22 DIAGNOSIS — T502X5A Adverse effect of carbonic-anhydrase inhibitors, benzothiadiazides and other diuretics, initial encounter: Secondary | ICD-10-CM | POA: Diagnosis not present

## 2017-04-22 HISTORY — DX: Heart failure, unspecified: I50.9

## 2017-04-22 LAB — POC OCCULT BLOOD, ED: FECAL OCCULT BLD: NEGATIVE

## 2017-04-22 LAB — CBC WITH DIFFERENTIAL/PLATELET
BASOS PCT: 0 %
Basophils Absolute: 0 10*3/uL (ref 0.0–0.1)
EOS ABS: 0.3 10*3/uL (ref 0.0–0.7)
Eosinophils Relative: 3 %
HEMATOCRIT: 23.6 % — AB (ref 39.0–52.0)
Hemoglobin: 7 g/dL — ABNORMAL LOW (ref 13.0–17.0)
LYMPHS ABS: 1.6 10*3/uL (ref 0.7–4.0)
Lymphocytes Relative: 19 %
MCH: 23 pg — AB (ref 26.0–34.0)
MCHC: 29.7 g/dL — AB (ref 30.0–36.0)
MCV: 77.4 fL — ABNORMAL LOW (ref 78.0–100.0)
MONOS PCT: 6 %
Monocytes Absolute: 0.5 10*3/uL (ref 0.1–1.0)
Neutro Abs: 6.2 10*3/uL (ref 1.7–7.7)
Neutrophils Relative %: 72 %
Platelets: 464 10*3/uL — ABNORMAL HIGH (ref 150–400)
RBC: 3.05 MIL/uL — ABNORMAL LOW (ref 4.22–5.81)
RDW: 19.6 % — AB (ref 11.5–15.5)
WBC: 8.6 10*3/uL (ref 4.0–10.5)

## 2017-04-22 LAB — BASIC METABOLIC PANEL
Anion gap: 9 (ref 5–15)
BUN: 14 mg/dL (ref 6–20)
CALCIUM: 8.3 mg/dL — AB (ref 8.9–10.3)
CHLORIDE: 106 mmol/L (ref 101–111)
CO2: 19 mmol/L — AB (ref 22–32)
Creatinine, Ser: 1.16 mg/dL (ref 0.61–1.24)
GFR calc non Af Amer: >60 mL/min (ref >60)
Glucose, Bld: 88 mg/dL (ref 65–99)
Potassium: 3.5 mmol/L (ref 3.5–5.1)
SODIUM: 134 mmol/L — AB (ref 135–145)

## 2017-04-22 LAB — BRAIN NATRIURETIC PEPTIDE: B Natriuretic Peptide: 3878 pg/mL — ABNORMAL HIGH (ref 0.0–100.0)

## 2017-04-22 LAB — TSH: TSH: 2.518 u[IU]/mL (ref 0.350–4.500)

## 2017-04-22 LAB — PREPARE RBC (CROSSMATCH)

## 2017-04-22 LAB — TROPONIN I

## 2017-04-22 LAB — SEDIMENTATION RATE: Sed Rate: 130 mm/hr — ABNORMAL HIGH (ref 0–16)

## 2017-04-22 MED ORDER — ENOXAPARIN SODIUM 40 MG/0.4ML ~~LOC~~ SOLN
40.0000 mg | SUBCUTANEOUS | Status: DC
  Administered 2017-04-23: 40 mg via SUBCUTANEOUS
  Filled 2017-04-22 (×2): qty 0.4

## 2017-04-22 MED ORDER — SPIRONOLACTONE 25 MG PO TABS
25.0000 mg | ORAL_TABLET | Freq: Every day | ORAL | Status: DC
  Administered 2017-04-23 – 2017-04-24 (×2): 25 mg via ORAL
  Filled 2017-04-22 (×3): qty 1

## 2017-04-22 MED ORDER — ACETAMINOPHEN 325 MG PO TABS: 650.0000 mg | ORAL_TABLET | Freq: Four times a day (QID) | ORAL | Status: DC | PRN

## 2017-04-22 MED ORDER — SACUBITRIL-VALSARTAN 97-103 MG PO TABS
1.0000 | ORAL_TABLET | Freq: Two times a day (BID) | ORAL | Status: DC
  Administered 2017-04-22 – 2017-04-25 (×6): 1 via ORAL
  Filled 2017-04-22 (×12): qty 1

## 2017-04-22 MED ORDER — VITAMIN B-1 100 MG PO TABS
100.0000 mg | ORAL_TABLET | Freq: Every day | ORAL | Status: DC
  Administered 2017-04-22 – 2017-04-25 (×4): 100 mg via ORAL
  Filled 2017-04-22 (×4): qty 1

## 2017-04-22 MED ORDER — LORAZEPAM 2 MG/ML IJ SOLN: 1.0000 mg | Freq: Four times a day (QID) | INTRAMUSCULAR | Status: DC | PRN

## 2017-04-22 MED ORDER — ACETAMINOPHEN 650 MG RE SUPP: 650.0000 mg | Freq: Four times a day (QID) | RECTAL | Status: DC | PRN

## 2017-04-22 MED ORDER — FOLIC ACID 1 MG PO TABS
1.0000 mg | ORAL_TABLET | Freq: Every day | ORAL | Status: DC
  Administered 2017-04-22 – 2017-04-25 (×4): 1 mg via ORAL
  Filled 2017-04-22 (×4): qty 1

## 2017-04-22 MED ORDER — FUROSEMIDE 10 MG/ML IJ SOLN
40.0000 mg | Freq: Once | INTRAMUSCULAR | Status: AC
  Administered 2017-04-22: 40 mg via INTRAVENOUS
  Filled 2017-04-22: qty 4

## 2017-04-22 MED ORDER — ADULT MULTIVITAMIN W/MINERALS CH
1.0000 | ORAL_TABLET | Freq: Every day | ORAL | Status: DC
  Administered 2017-04-22 – 2017-04-25 (×4): 1 via ORAL
  Filled 2017-04-22 (×4): qty 1

## 2017-04-22 MED ORDER — SODIUM CHLORIDE 0.9 % IV SOLN: 250.0000 mL | INTRAVENOUS | Status: DC | PRN

## 2017-04-22 MED ORDER — LORAZEPAM 2 MG/ML IJ SOLN: 0.0000 mg | Freq: Four times a day (QID) | INTRAMUSCULAR | Status: AC

## 2017-04-22 MED ORDER — SODIUM CHLORIDE 0.9 % IV SOLN: 10.0000 mL/h | Freq: Once | INTRAVENOUS | Status: DC

## 2017-04-22 MED ORDER — CARVEDILOL 12.5 MG PO TABS
12.5000 mg | ORAL_TABLET | Freq: Two times a day (BID) | ORAL | Status: DC
  Administered 2017-04-22 – 2017-04-25 (×6): 12.5 mg via ORAL
  Filled 2017-04-22 (×7): qty 1

## 2017-04-22 MED ORDER — SODIUM CHLORIDE 0.9% FLUSH
3.0000 mL | Freq: Two times a day (BID) | INTRAVENOUS | Status: DC
  Administered 2017-04-23 – 2017-04-24 (×2): 3 mL via INTRAVENOUS

## 2017-04-22 MED ORDER — FUROSEMIDE 10 MG/ML IJ SOLN
40.0000 mg | Freq: Every day | INTRAMUSCULAR | Status: DC
  Filled 2017-04-22: qty 4

## 2017-04-22 MED ORDER — SODIUM CHLORIDE 0.9% FLUSH: 3.0000 mL | INTRAVENOUS | Status: DC | PRN

## 2017-04-22 MED ORDER — LORAZEPAM 1 MG PO TABS
1.0000 mg | ORAL_TABLET | Freq: Four times a day (QID) | ORAL | Status: DC | PRN
  Administered 2017-04-23 – 2017-04-24 (×3): 1 mg via ORAL
  Filled 2017-04-22 (×3): qty 1

## 2017-04-22 MED ORDER — THIAMINE HCL 100 MG/ML IJ SOLN
100.0000 mg | Freq: Every day | INTRAMUSCULAR | Status: DC
  Filled 2017-04-22: qty 2

## 2017-04-22 MED ORDER — LORAZEPAM 2 MG/ML IJ SOLN
0.0000 mg | Freq: Two times a day (BID) | INTRAMUSCULAR | Status: DC
  Filled 2017-04-22: qty 1
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
## 2017-04-22 NOTE — ED Triage Notes (Addendum)
Shortness of breath, Cough, congestion, fever and chills since Sunday. Resp even and non labored at this times.
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
## 2017-04-23 ENCOUNTER — Inpatient Hospital Stay (HOSPITAL_COMMUNITY): Payer: BLUE CROSS/BLUE SHIELD

## 2017-04-23 DIAGNOSIS — D509 Iron deficiency anemia, unspecified: Secondary | ICD-10-CM | POA: Insufficient documentation

## 2017-04-23 DIAGNOSIS — N183 Chronic kidney disease, stage 3 (moderate): Secondary | ICD-10-CM

## 2017-04-23 DIAGNOSIS — I429 Cardiomyopathy, unspecified: Secondary | ICD-10-CM

## 2017-04-23 DIAGNOSIS — I361 Nonrheumatic tricuspid (valve) insufficiency: Secondary | ICD-10-CM

## 2017-04-23 DIAGNOSIS — E876 Hypokalemia: Secondary | ICD-10-CM | POA: Clinically undetermined

## 2017-04-23 DIAGNOSIS — I5023 Acute on chronic systolic (congestive) heart failure: Secondary | ICD-10-CM | POA: Diagnosis present

## 2017-04-23 DIAGNOSIS — I340 Nonrheumatic mitral (valve) insufficiency: Secondary | ICD-10-CM

## 2017-04-23 DIAGNOSIS — F1010 Alcohol abuse, uncomplicated: Secondary | ICD-10-CM

## 2017-04-23 DIAGNOSIS — Z720 Tobacco use: Secondary | ICD-10-CM

## 2017-04-23 LAB — ECHOCARDIOGRAM COMPLETE
AVLVOTPG: 3 mmHg
CHL CUP RV SYS PRESS: 41 mmHg
CHL CUP STROKE VOLUME: 42 mL
EERAT: 13.35
EWDT: 155 ms
FS: 12 % — AB (ref 28–44)
HEIGHTINCHES: 72 in
IVS/LV PW RATIO, ED: 0.82
LA ID, A-P, ES: 52 mm
LA diam index: 2.73 cm/m2
LA vol index: 64 mL/m2
LAVOL: 122 mL
LAVOLA4C: 132 mL
LEFT ATRIUM END SYS DIAM: 52 mm
LV E/e' medial: 13.35
LV E/e'average: 13.35
LV PW d: 14.8 mm — AB (ref 0.6–1.1)
LV SIMPSON'S DISK: 22
LV TDI E'LATERAL: 9.14
LV TDI E'MEDIAL: 4.24
LV dias vol: 190 mL — AB (ref 62–150)
LV e' LATERAL: 9.14 cm/s
LV sys vol index: 78 mL/m2
LVDIAVOLIN: 100 mL/m2
LVOT SV: 58 mL
LVOT VTI: 13.9 cm
LVOT area: 4.15 cm2
LVOT diameter: 23 mm
LVOT peak vel: 87.2 cm/s
LVSYSVOL: 148 mL — AB (ref 21–61)
MRPISAEROA: 0.08 cm2
MV Dec: 155
MV Peak grad: 6 mmHg
MVPKEVEL: 122 m/s
RV TAPSE: 20.3 mm
Reg peak vel: 255 cm/s
TRMAXVEL: 255 cm/s
VTI: 141 cm
WEIGHTICAEL: 2532.64 [oz_av]

## 2017-04-23 LAB — CBC
HCT: 27.6 % — ABNORMAL LOW (ref 39.0–52.0)
HEMOGLOBIN: 8.3 g/dL — AB (ref 13.0–17.0)
MCH: 23.9 pg — AB (ref 26.0–34.0)
MCHC: 30.1 g/dL (ref 30.0–36.0)
MCV: 79.3 fL (ref 78.0–100.0)
Platelets: 478 10*3/uL — ABNORMAL HIGH (ref 150–400)
RBC: 3.48 MIL/uL — AB (ref 4.22–5.81)
RDW: 19.4 % — ABNORMAL HIGH (ref 11.5–15.5)
WBC: 9.7 10*3/uL (ref 4.0–10.5)

## 2017-04-23 LAB — COMPREHENSIVE METABOLIC PANEL
ALK PHOS: 126 U/L (ref 38–126)
ALT: 17 U/L (ref 17–63)
ANION GAP: 10 (ref 5–15)
AST: 21 U/L (ref 15–41)
Albumin: 2.3 g/dL — ABNORMAL LOW (ref 3.5–5.0)
BUN: 12 mg/dL (ref 6–20)
CALCIUM: 8.1 mg/dL — AB (ref 8.9–10.3)
CO2: 20 mmol/L — AB (ref 22–32)
Chloride: 102 mmol/L (ref 101–111)
Creatinine, Ser: 1.26 mg/dL — ABNORMAL HIGH (ref 0.61–1.24)
GFR calc non Af Amer: >60 mL/min (ref >60)
Glucose, Bld: 92 mg/dL (ref 65–99)
POTASSIUM: 3.2 mmol/L — AB (ref 3.5–5.1)
SODIUM: 132 mmol/L — AB (ref 135–145)
TOTAL PROTEIN: 9.2 g/dL — AB (ref 6.5–8.1)
Total Bilirubin: 0.6 mg/dL (ref 0.3–1.2)

## 2017-04-23 LAB — FERRITIN: Ferritin: 48 ng/mL (ref 24–336)

## 2017-04-23 LAB — VITAMIN B12: Vitamin B-12: 397 pg/mL (ref 180–914)

## 2017-04-23 LAB — IRON AND TIBC
IRON: 20 ug/dL — AB (ref 45–182)
Saturation Ratios: 10 % — ABNORMAL LOW (ref 17.9–39.5)
TIBC: 200 ug/dL — AB (ref 250–450)
UIBC: 180 ug/dL

## 2017-04-23 LAB — MAGNESIUM: MAGNESIUM: 1.6 mg/dL — AB (ref 1.7–2.4)

## 2017-04-23 LAB — TROPONIN I
TROPONIN I: 0.03 ng/mL — AB (ref <0.03)
TROPONIN I: 0.03 ng/mL — AB (ref <0.03)

## 2017-04-23 MED ORDER — FUROSEMIDE 10 MG/ML IJ SOLN
20.0000 mg | Freq: Once | INTRAMUSCULAR | Status: AC
  Administered 2017-04-23: 20 mg via INTRAVENOUS
  Filled 2017-04-23: qty 2

## 2017-04-23 MED ORDER — POTASSIUM CHLORIDE CRYS ER 20 MEQ PO TBCR
40.0000 meq | EXTENDED_RELEASE_TABLET | ORAL | Status: AC
  Administered 2017-04-23 (×2): 40 meq via ORAL
  Filled 2017-04-23 (×2): qty 2

## 2017-04-23 MED ORDER — FUROSEMIDE 10 MG/ML IJ SOLN
40.0000 mg | Freq: Every day | INTRAMUSCULAR | Status: DC
  Administered 2017-04-23: 40 mg via INTRAVENOUS
  Filled 2017-04-23: qty 4

## 2017-04-23 MED ORDER — SODIUM CHLORIDE 0.9 % IV SOLN
510.0000 mg | Freq: Once | INTRAVENOUS | Status: AC
  Administered 2017-04-23: 510 mg via INTRAVENOUS
  Filled 2017-04-23: qty 17

## 2017-04-23 MED ORDER — ZOLPIDEM TARTRATE 5 MG PO TABS
5.0000 mg | ORAL_TABLET | Freq: Every evening | ORAL | Status: DC | PRN
Start: 2017-04-23 — End: 2017-04-25
  Filled 2017-04-23 (×2): qty 1

## 2017-04-23 MED ORDER — NICOTINE 21 MG/24HR TD PT24
21.0000 mg | MEDICATED_PATCH | Freq: Every day | TRANSDERMAL | Status: DC
  Filled 2017-04-23 (×2): qty 1

## 2017-04-23 MED ORDER — IPRATROPIUM-ALBUTEROL 0.5-2.5 (3) MG/3ML IN SOLN: 3.0000 mL | Freq: Once | RESPIRATORY_TRACT | Status: DC

## 2017-04-23 MED ORDER — FUROSEMIDE 10 MG/ML IJ SOLN
40.0000 mg | Freq: Two times a day (BID) | INTRAMUSCULAR | Status: DC
  Administered 2017-04-23 – 2017-04-24 (×3): 40 mg via INTRAVENOUS
  Filled 2017-04-23 (×3): qty 4

## 2017-04-23 MED ORDER — FUROSEMIDE 10 MG/ML IJ SOLN: 40.0000 mg | Freq: Two times a day (BID) | INTRAMUSCULAR | Status: DC

## 2017-04-23 MED ORDER — MAGNESIUM SULFATE 4 GM/100ML IV SOLN
4.0000 g | Freq: Once | INTRAVENOUS | Status: AC
  Administered 2017-04-23: 4 g via INTRAVENOUS
  Filled 2017-04-23: qty 100

## 2017-04-23 NOTE — Progress Notes (Signed)
PROGRESS NOTE    Paul Hahn  GEX:[id number] DOB: 19-Jul-1975 DOA: 04/22/2017 PCP: System, Provider Not In    Brief Narrative:  Patient is a 42 year old gentleman, history of cardiomyopathy thought to be secondary to alcohol use, history of chronic systolic heart failure EF of 35% presented with complaints of dyspnea times 2-3 days which has been worsening with dyspnea on exertion.  Patient also admits to drinking more alcohol than normal but denies any history of withdrawal.  Chest x-ray done concerning for acute CHF exacerbation.   Assessment & Plan:   Principal Problem:   Acute on chronic systolic CHF (congestive heart failure) (HCC) Active Problems:   Cardiomyopathy (HCC)   Chronic systolic heart failure (HCC)   CKD (chronic kidney disease) stage 3, GFR 30-59 ml/min (HCC)   Alcohol abuse   Tobacco abuse   CHF (congestive heart failure) (HCC)   Anemia   Hypokalemia   Hypomagnesemia   Iron deficiency anemia  #1 acute on chronic systolic heart failure/cardiomyopathy Likely secondary to medical noncompliance as patient did state had run out of his medications prior to onset of his symptoms however has been able to fill up his prescriptions and as such has medications on discharge.  Patient states some clinical improvement.  Urine output of 1.4 L over the past 24 hours.  2D echo with EF of 25-30% with diffuse hypokinesis.  Cardiac enzymes flattened at 0.03.  Patient with current weight of 158 pounds from 167 pounds on admission.  Increase Lasix to 40 mg IV every 12 hours.  Continue Coreg, Entresto, Aldactone.  Strict I's and O's.  Daily weights.  2.  Chronic kidney disease stage III Stable.  Follow with diuresis.  3.  Iron deficiency anemia Patient status post transfusion of 2 units packed red blood cells.  Hemoglobin currently at 8.3 from 7.0.  Anemia panel consistent with iron deficiency anemia.  We will give a dose of IV Feraheme.  Follow.  4.   Hypokalemia/hypomagnesemia Replete.  5.  Tobacco abuse Tobacco cessation.  Place on a nicotine patch.  6.  Alcohol abuse Patient with no signs of withdrawal.  Continue the Ativan withdrawal protocol.   DVT prophylaxis: Lovenox Code Status: Full Family Communication: Updated mother via telephone.  Updated patient at bedside. Disposition Plan: Home likely once acute heart failure has improved.   Consultants:   None  Procedures:  Chest x-ray 04/22/2017  2D echo pending 04/23/2017  Transfused 2 units packed red blood cells 04/22/2017-04/23/2017  Antimicrobials:   None   Subjective: Patient laying in bed.  Patient states shortness of breath has improved since admission.  Patient stated that last night felt he was suffocating however clinical improvement after being given a dose of IV Lasix.  Denies any chest pain.  Patient with no noted tremors.  Stated run out of his medications prior to onset of his symptoms.  Objective: Vitals:   04/23/17 0029 04/23/17 0100 04/23/17 0325 04/23/17 0354  BP: 133/76 (!) 135/98 (!) 133/96   Pulse: 100 (!) 104 (!) 102   Resp: 18 20 18    Temp: 98.4 F (36.9 C)  98.9 F (37.2 C)   TempSrc: Oral Oral Oral   SpO2: 100% 99% 95%   Weight:    71.8 kg (158 lb 4.6 oz)  Height:        Intake/Output Summary (Last 24 hours) at 04/23/2017 1849 Last data filed at 04/23/2017 1700 Gross per 24 hour  Intake 888 ml  Output 1400 ml  Net -512 ml  Filed Weights   04/22/17 1152 04/22/17 2015 04/23/17 0354  Weight: 76 kg (167 lb 9.6 oz) 72 kg (158 lb 11.7 oz) 71.8 kg (158 lb 4.6 oz)    Examination:  General exam: Appears calm and comfortable  Respiratory system: Diffuse crackles. No wheezing Cardiovascular system: S1 & S2 heard, RRR.+ JVD, murmurs, rubs, gallops or clicks. trace pedal edema. Gastrointestinal system: Abdomen is nondistended, soft and nontender. No organomegaly or masses felt. Normal bowel sounds heard. Central nervous system: Alert  and oriented. No focal neurological deficits. Extremities: Symmetric 5 x 5 power. Skin: No rashes, lesions or ulcers Psychiatry: Judgement and insight appear normal. Mood & affect appropriate.     Data Reviewed: I have personally reviewed following labs and imaging studies  CBC: Recent Labs  Lab 04/22/17 1257 04/23/17 0749  WBC 8.6 9.7  NEUTROABS 6.2  --   HGB 7.0* 8.3*  HCT 23.6* 27.6*  MCV 77.4* 79.3  PLT 464* 478*   Basic Metabolic Panel: Recent Labs  Lab 04/22/17 1257 04/23/17 0749  NA 134* 132*  K 3.5 3.2*  CL 106 102  CO2 19* 20*  GLUCOSE 88 92  BUN 14 12  CREATININE 1.16 1.26*  CALCIUM 8.3* 8.1*  MG  --  1.6*   GFR: Estimated Creatinine Clearance: 79.9 mL/min (A) (by C-G formula based on SCr of 1.26 mg/dL (H)). Liver Function Tests: Recent Labs  Lab 04/23/17 0749  AST 21  ALT 17  ALKPHOS 126  BILITOT 0.6  PROT 9.2*  ALBUMIN 2.3*   No results for input(s): LIPASE, AMYLASE in the last 168 hours. No results for input(s): AMMONIA in the last 168 hours. Coagulation Profile: No results for input(s): INR, PROTIME in the last 168 hours. Cardiac Enzymes: Recent Labs  Lab 04/22/17 2108 04/23/17 0222 04/23/17 0749  TROPONINI <0.03 0.03* 0.03*   BNP (last 3 results) No results for input(s): PROBNP in the last 8760 hours. HbA1C: No results for input(s): HGBA1C in the last 72 hours. CBG: No results for input(s): GLUCAP in the last 168 hours. Lipid Profile: No results for input(s): CHOL, HDL, LDLCALC, TRIG, CHOLHDL, LDLDIRECT in the last 72 hours. Thyroid Function Tests: Recent Labs    04/22/17 2108  TSH 2.518   Anemia Panel: Recent Labs    04/22/17 2108  VITAMINB12 397  FERRITIN 48  TIBC 200*  IRON 20*   Sepsis Labs: No results for input(s): PROCALCITON, LATICACIDVEN in the last 168 hours.  No results found for this or any previous visit (from the past 240 hour(s)).       Radiology Studies: Dg Chest 2 View  Result Date:  04/22/2017 CLINICAL DATA:  Productive cough for 1 month. Shortness of breath for 1 week. Congestive heart failure. EXAM: CHEST  2 VIEW COMPARISON:  05/10/2016 FINDINGS: Moderate cardiomegaly again noted. Diffuse interstitial infiltrates with Kerley B-lines, consistent with mild interstitial edema. No evidence of pulmonary consolidation or pleural effusion. IMPRESSION: Mild congestive heart failure. No evidence of pulmonary consolidation or pleural effusion. Electronically Signed   By: Myles Rosenthal M.D.   On: 04/22/2017 12:12        Scheduled Meds: . carvedilol  12.5 mg Oral BID WC  . enoxaparin (LOVENOX) injection  40 mg Subcutaneous Q24H  . folic acid  1 mg Oral Daily  . furosemide  40 mg Intravenous Q12H  . ipratropium-albuterol  3 mL Nebulization Once  . LORazepam  0-4 mg Intravenous Q6H   Followed by  . [START ON 04/24/2017] LORazepam  0-4 mg Intravenous Q12H  . multivitamin with minerals  1 tablet Oral Daily  . sacubitril-valsartan  1 tablet Oral BID  . sodium chloride flush  3 mL Intravenous Q12H  . spironolactone  25 mg Oral Daily  . thiamine  100 mg Oral Daily   Or  . thiamine  100 mg Intravenous Daily   Continuous Infusions: . sodium chloride    . sodium chloride       LOS: 1 day    Time spent: 40 minutes    Ramiro Harvest, MD [facility] Pager [phone]  If 7PM-7AM, please contact night-coverage [email or web address] Password [facility] 04/23/2017, 6:49 PM

## 2017-04-23 NOTE — Progress Notes (Signed)
Night shift floor coverage note  Staff reported that the patient was feeling dyspneic after the first unit of PRBC transfusion.  Another 20 mg furosemide IVP was ordered.  Second unit of PRBC is currently being transfused.  Sanda Klein, MD.

## 2017-04-23 NOTE — Progress Notes (Signed)
CRITICAL VALUE ALERT  Critical Value: troponin 0.03  Date & Time Notied: 2./20/19 0310  Provider Notified: Robb Matar  Orders Received/Actions taken:

## 2017-04-23 NOTE — Progress Notes (Signed)
*  PRELIMINARY RESULTS* Echocardiogram 2D Echocardiogram has been performed.  Stacey Drain 04/23/2017, 11:40 AM

## 2017-04-24 LAB — CBC
HEMATOCRIT: 30.5 % — AB (ref 39.0–52.0)
HEMOGLOBIN: 9.1 g/dL — AB (ref 13.0–17.0)
MCH: 23.8 pg — ABNORMAL LOW (ref 26.0–34.0)
MCHC: 29.8 g/dL — AB (ref 30.0–36.0)
MCV: 79.8 fL (ref 78.0–100.0)
Platelets: 511 10*3/uL — ABNORMAL HIGH (ref 150–400)
RBC: 3.82 MIL/uL — ABNORMAL LOW (ref 4.22–5.81)
RDW: 19.7 % — ABNORMAL HIGH (ref 11.5–15.5)
WBC: 11.5 10*3/uL — ABNORMAL HIGH (ref 4.0–10.5)

## 2017-04-24 LAB — BPAM RBC
BLOOD PRODUCT EXPIRATION DATE: 201903182359
BLOOD PRODUCT EXPIRATION DATE: 201903212359
ISSUE DATE / TIME: 201902200032
ISSUE DATE / TIME: 201902192118
Unit Type and Rh: 5100
Unit Type and Rh: 5100

## 2017-04-24 LAB — TYPE AND SCREEN
ABO/RH(D): O POS
ANTIBODY SCREEN: NEGATIVE
UNIT DIVISION: 0
UNIT DIVISION: 0

## 2017-04-24 LAB — BASIC METABOLIC PANEL
ANION GAP: 10 (ref 5–15)
BUN: 18 mg/dL (ref 6–20)
CHLORIDE: 101 mmol/L (ref 101–111)
CO2: 21 mmol/L — AB (ref 22–32)
CREATININE: 1.31 mg/dL — AB (ref 0.61–1.24)
Calcium: 8 mg/dL — ABNORMAL LOW (ref 8.9–10.3)
GFR calc Af Amer: >60 mL/min (ref >60)
GFR calc non Af Amer: >60 mL/min (ref >60)
GLUCOSE: 94 mg/dL (ref 65–99)
Potassium: 3.8 mmol/L (ref 3.5–5.1)
Sodium: 132 mmol/L — ABNORMAL LOW (ref 135–145)

## 2017-04-24 LAB — PROTEIN ELECTROPHORESIS, SERUM
A/G Ratio: 0.4 — ABNORMAL LOW (ref 0.7–1.7)
ALPHA-1-GLOBULIN: 0.3 g/dL (ref 0.0–0.4)
Albumin ELP: 2.4 g/dL — ABNORMAL LOW (ref 2.9–4.4)
Alpha-2-Globulin: 0.8 g/dL (ref 0.4–1.0)
Beta Globulin: 1 g/dL (ref 0.7–1.3)
GAMMA GLOBULIN: 4.4 g/dL — AB (ref 0.4–1.8)
GLOBULIN, TOTAL: 6.5 g/dL — AB (ref 2.2–3.9)
TOTAL PROTEIN ELP: 8.9 g/dL — AB (ref 6.0–8.5)

## 2017-04-24 LAB — MAGNESIUM: MAGNESIUM: 2 mg/dL (ref 1.7–2.4)

## 2017-04-24 LAB — FOLATE RBC
FOLATE, HEMOLYSATE: 228.3 ng/mL
Folate, RBC: 959 ng/mL (ref >498)
HEMATOCRIT: 23.8 % — AB (ref 37.5–51.0)

## 2017-04-24 MED ORDER — FUROSEMIDE 40 MG PO TABS
40.0000 mg | ORAL_TABLET | Freq: Two times a day (BID) | ORAL | Status: DC
  Administered 2017-04-24 – 2017-04-25 (×2): 40 mg via ORAL
  Filled 2017-04-24 (×2): qty 1

## 2017-04-24 NOTE — Care Management Note (Signed)
Case Management Note  Patient Details  Name: Paul Hahn MRN: [id number] Date of Birth: 1975-05-01  Subjective/Objective:        Admitted with CHF. Pt is from home, ind pta. He has PCP, transportation and insurance with drug coverage. Pt reports compliance with daily weights and medications. Notes pta he had ran out of aldactone, insurance no longer covered medications, his sister paid for it and he never missed doses. Pt has been weaned from supplemental oxygen. Is not homebound, not eligible for [facility].       Action/Plan: Pt DC home in next 24 hrs with no needs.   Expected Discharge Date:       04/24/2017           Expected Discharge Plan:  Home/Self Care  In-House Referral:  NA  Discharge planning Services  CM Consult  Post Acute Care Choice:  NA Choice offered to:  NA  Status of Service:  Completed, signed off  If discussed at Long Length of Stay Meetings, dates discussed:    Additional Comments:  Malcolm Metro, RN 04/24/2017, 3:30 PM

## 2017-04-24 NOTE — Progress Notes (Signed)
PROGRESS NOTE    Paul Hahn  HUT:[id number] DOB: [birth year] DOA: 04/22/2017 PCP: System, Provider Not In    Brief Narrative:  Patient is a 42 year old gentleman, history of cardiomyopathy thought to be secondary to alcohol use, history of chronic systolic heart failure EF of 35% presented with complaints of dyspnea times 2-3 days which has been worsening with dyspnea on exertion.  Patient also admits to drinking more alcohol than normal but denies any history of withdrawal.  Chest x-ray done concerning for acute CHF exacerbation.   Assessment & Plan:   Principal Problem:   Acute on chronic systolic CHF (congestive heart failure) (HCC) Active Problems:   Cardiomyopathy (HCC)   Chronic systolic heart failure (HCC)   CKD (chronic kidney disease) stage 3, GFR 30-59 ml/min (HCC)   Alcohol abuse   Tobacco abuse   CHF (congestive heart failure) (HCC)   Anemia   Hypokalemia   Hypomagnesemia   Iron deficiency anemia  #1 acute on chronic systolic heart failure/cardiomyopathy Likely secondary to medical noncompliance as patient did state had run out of his medications prior to onset of his symptoms however has been able to fill up his prescriptions and as such has medications on discharge.  Patient clinical improvement on a daily basis.  Patient was urinating directly in the toilet and as such urine output not recorded over the past 24 hours.  Patient still volume overloaded on exam with crackles noted.  2D echo with EF of 25-30% with diffuse hypokinesis.  Cardiac enzymes flattened at 0.03.  Patient with current weight of 162 pounds from 158 pounds from 167 pounds on admission.  Weight seems unreliable.  Continue current dose of IV Lasix 40 mg every 12 hours through today and transition to home dose oral Lasix tomorrow morning. Continue Coreg, Entresto, Aldactone.  Strict I's and O's.  Daily weights.  2.  Chronic kidney disease stage III Stable.  Follow with diuresis.  3.  Iron  deficiency anemia Patient status post transfusion of 2 units packed red blood cells.  Hemoglobin currently at 9.1 from 8.3 from 7.0.  Anemia panel consistent with iron deficiency anemia.  Patient received a dose of IV Feraheme.  Follow H&H.    4.  Hypokalemia/hypomagnesemia Secondary to diuresis.  Repleted.  5.  Tobacco abuse Tobacco cessation.  Continue nicotine patch.   6.  Alcohol abuse Patient with no signs of withdrawal.  Continue the Ativan withdrawal protocol.   DVT prophylaxis: Lovenox Code Status: Full Family Communication: Updated patient at bedside. Disposition Plan: Home likely once acute heart failure has improved hopefully in the next 24 hours..   Consultants:   None  Procedures:  Chest x-ray 04/22/2017  2D echo pending 04/23/2017  Transfused 2 units packed red blood cells 04/22/2017-04/23/2017  Antimicrobials:   None   Subjective: Patient up at the sink brushing his teeth.  Patient states shortness of breath improving daily.  Patient states feeling much better.  Denies any chest pain.  Patient stated had been urinating directly in the toilet and as such urine output was not recorded over the past 24 hours.   Objective: Vitals:   04/24/17 0500 04/24/17 1209 04/24/17 1215 04/24/17 1854  BP: 124/78  131/82 112/72  Pulse: (!) 102  100 95  Resp: 18   18  Temp: 99.1 F (37.3 C)   99.3 F (37.4 C)  TempSrc: Oral   Oral  SpO2: 99% (!) 89% 100% 98%  Weight: 73.5 kg (162 lb 0.6 oz)  Height:        Intake/Output Summary (Last 24 hours) at 04/24/2017 1920 Last data filed at 04/24/2017 1800 Gross per 24 hour  Intake 480 ml  Output 800 ml  Net -320 ml   Filed Weights   04/22/17 2015 04/23/17 0354 04/24/17 0500  Weight: 72 kg (158 lb 11.7 oz) 71.8 kg (158 lb 4.6 oz) 73.5 kg (162 lb 0.6 oz)    Examination:  General exam: Appears calm and comfortable  Respiratory system: Less Diffuse crackles. No wheezing Cardiovascular system: Regular rate rhythm no  murmurs rubs or gallops.  No JVD.  Trace lower extremity edema.  Gastrointestinal system: Abdomen is soft, nondistended, nontender, positive bowel sounds.  Central nervous system: Alert and oriented. No focal neurological deficits. Extremities: Symmetric 5 x 5 power. Skin: No rashes, lesions or ulcers Psychiatry: Judgement and insight appear normal. Mood & affect appropriate.     Data Reviewed: I have personally reviewed following labs and imaging studies  CBC: Recent Labs  Lab 04/22/17 1257 04/22/17 2108 04/23/17 0749 04/24/17 0423  WBC 8.6  --  9.7 11.5*  NEUTROABS 6.2  --   --   --   HGB 7.0*  --  8.3* 9.1*  HCT 23.6* 23.8* 27.6* 30.5*  MCV 77.4*  --  79.3 79.8  PLT 464*  --  478* 511*   Basic Metabolic Panel: Recent Labs  Lab 04/22/17 1257 04/23/17 0749 04/24/17 0423  NA 134* 132* 132*  K 3.5 3.2* 3.8  CL 106 102 101  CO2 19* 20* 21*  GLUCOSE 88 92 94  BUN 14 12 18   CREATININE 1.16 1.26* 1.31*  CALCIUM 8.3* 8.1* 8.0*  MG  --  1.6* 2.0   GFR: Estimated Creatinine Clearance: 78.7 mL/min (A) (by C-G formula based on SCr of 1.31 mg/dL (H)). Liver Function Tests: Recent Labs  Lab 04/23/17 0749  AST 21  ALT 17  ALKPHOS 126  BILITOT 0.6  PROT 9.2*  ALBUMIN 2.3*   No results for input(s): LIPASE, AMYLASE in the last 168 hours. No results for input(s): AMMONIA in the last 168 hours. Coagulation Profile: No results for input(s): INR, PROTIME in the last 168 hours. Cardiac Enzymes: Recent Labs  Lab 04/22/17 2108 04/23/17 0222 04/23/17 0749  TROPONINI <0.03 0.03* 0.03*   BNP (last 3 results) No results for input(s): PROBNP in the last 8760 hours. HbA1C: No results for input(s): HGBA1C in the last 72 hours. CBG: No results for input(s): GLUCAP in the last 168 hours. Lipid Profile: No results for input(s): CHOL, HDL, LDLCALC, TRIG, CHOLHDL, LDLDIRECT in the last 72 hours. Thyroid Function Tests: Recent Labs    04/22/17 2108  TSH 2.518   Anemia  Panel: Recent Labs    04/22/17 2108  VITAMINB12 397  FERRITIN 48  TIBC 200*  IRON 20*   Sepsis Labs: No results for input(s): PROCALCITON, LATICACIDVEN in the last 168 hours.  No results found for this or any previous visit (from the past 240 hour(s)).       Radiology Studies: No results found.      Scheduled Meds: . carvedilol  12.5 mg Oral BID WC  . enoxaparin (LOVENOX) injection  40 mg Subcutaneous Q24H  . folic acid  1 mg Oral Daily  . furosemide  40 mg Intravenous Q12H  . ipratropium-albuterol  3 mL Nebulization Once  . LORazepam  0-4 mg Intravenous Q6H   Followed by  . LORazepam  0-4 mg Intravenous Q12H  . multivitamin with minerals  1 tablet Oral Daily  . nicotine  21 mg Transdermal Daily  . sacubitril-valsartan  1 tablet Oral BID  . sodium chloride flush  3 mL Intravenous Q12H  . spironolactone  25 mg Oral Daily  . thiamine  100 mg Oral Daily   Or  . thiamine  100 mg Intravenous Daily   Continuous Infusions: . sodium chloride    . sodium chloride       LOS: 2 days    Time spent: 35 minutes    Ramiro Harvest, MD [facility] Pager [phone]  If 7PM-7AM, please contact night-coverage [email or web address] Password [facility] 04/24/2017, 7:20 PM

## 2017-04-25 DIAGNOSIS — D649 Anemia, unspecified: Secondary | ICD-10-CM

## 2017-04-25 LAB — BASIC METABOLIC PANEL
Anion gap: 9 (ref 5–15)
BUN: 19 mg/dL (ref 6–20)
CO2: 23 mmol/L (ref 22–32)
Calcium: 8 mg/dL — ABNORMAL LOW (ref 8.9–10.3)
Chloride: 100 mmol/L — ABNORMAL LOW (ref 101–111)
Creatinine, Ser: 1.26 mg/dL — ABNORMAL HIGH (ref 0.61–1.24)
GFR calc Af Amer: >60 mL/min (ref >60)
GFR calc non Af Amer: >60 mL/min (ref >60)
GLUCOSE: 96 mg/dL (ref 65–99)
POTASSIUM: 3.5 mmol/L (ref 3.5–5.1)
Sodium: 132 mmol/L — ABNORMAL LOW (ref 135–145)

## 2017-04-25 LAB — CBC
HEMATOCRIT: 33.2 % — AB (ref 39.0–52.0)
Hemoglobin: 10 g/dL — ABNORMAL LOW (ref 13.0–17.0)
MCH: 24.2 pg — ABNORMAL LOW (ref 26.0–34.0)
MCHC: 30.1 g/dL (ref 30.0–36.0)
MCV: 80.4 fL (ref 78.0–100.0)
Platelets: 481 10*3/uL — ABNORMAL HIGH (ref 150–400)
RBC: 4.13 MIL/uL — AB (ref 4.22–5.81)
RDW: 20.1 % — ABNORMAL HIGH (ref 11.5–15.5)
WBC: 10.7 10*3/uL — ABNORMAL HIGH (ref 4.0–10.5)

## 2017-04-25 MED ORDER — THIAMINE HCL 100 MG PO TABS: 100.0000 mg | ORAL_TABLET | Freq: Every day | ORAL | Status: DC

## 2017-04-25 MED ORDER — NICOTINE 21 MG/24HR TD PT24: 21.0000 mg | MEDICATED_PATCH | Freq: Every day | TRANSDERMAL | 0 refills | Status: DC

## 2017-04-25 MED ORDER — POTASSIUM CHLORIDE CRYS ER 20 MEQ PO TBCR
40.0000 meq | EXTENDED_RELEASE_TABLET | Freq: Once | ORAL | Status: AC
  Administered 2017-04-25: 40 meq via ORAL
  Filled 2017-04-25: qty 2

## 2017-04-25 MED ORDER — FUROSEMIDE 40 MG PO TABS: 40.0000 mg | ORAL_TABLET | Freq: Every day | ORAL | 0 refills | Status: DC

## 2017-04-25 MED ORDER — ADULT MULTIVITAMIN W/MINERALS CH: 1.0000 | ORAL_TABLET | Freq: Every day | ORAL | Status: AC

## 2017-04-25 MED ORDER — FOLIC ACID 1 MG PO TABS: 1.0000 mg | ORAL_TABLET | Freq: Every day | ORAL | Status: DC

## 2017-04-25 NOTE — Discharge Summary (Signed)
Physician Discharge Summary  Paul Hahn ZOX:[id number] DOB: 06/06/1975 DOA: 04/22/2017  PCP: System, Provider Not In  Admit date: 04/22/2017 Discharge date: 04/25/2017  Time spent: 60 minutes  Recommendations for Outpatient Follow-up:  1. Follow-up with Maxine Glenn, PA heart failure clinic on 05/07/2017 at 1:30 PM.  Patient on follow-up will need a basic metabolic profile done to follow-up on electrolytes and renal function.  Patient also need a CBC done to follow-up on H&H.   Discharge Diagnoses:  Principal Problem:   Acute on chronic systolic CHF (congestive heart failure) (HCC) Active Problems:   Cardiomyopathy (HCC)   Chronic systolic heart failure (HCC)   CKD (chronic kidney disease) stage 3, GFR 30-59 ml/min (HCC)   Alcohol abuse   Tobacco abuse   CHF (congestive heart failure) (HCC)   Anemia   Hypokalemia   Hypomagnesemia   Iron deficiency anemia   Discharge Condition: Stable and improved  Diet recommendation: Heart healthy  Filed Weights   04/24/17 0500 04/24/17 2043 04/25/17 0500  Weight: 73.5 kg (162 lb 0.6 oz) 71.4 kg (157 lb 6.5 oz) 70.7 kg (155 lb 13.8 oz)    History of present illness:  Per Dr Milagros Reap  is a 42 y.o. male, w CHF (EF 35%), presented with c/o dyspnea since Sunday.  + dyspnea on exertion. Denied fever, chills, cp, palp, orthopnea, pnd, lower ext edema. Pt presented due to dyspnea. Admitted drinking more ETOH than normal but denied any hx of withdrawal.   In ED,  CXR IMPRESSION: Mild congestive heart failure. No evidence of pulmonary consolidation or pleural effusion.   Wbc 8.6, Hgb 7.0, Plt 464, MCV 77.4, RDW 19.6  Na 134, K 3.5,  Glucose 88 Bun 14, Creatinine 1.16 BNP 3,878.0  Pt will be admitted for CHF, and anemia    Hospital Course:  #1 acute on chronic systolic heart failure/cardiomyopathy Likely secondary to medical noncompliance as patient did state had run out of his medications prior to onset of  his symptoms however has been able to fill up his prescriptions and as such has medications on discharge.  Patient was admitted placed on IV diuretics as well as his regimen of Coreg, Entresto, Aldactone.  Patient improved clinically on a daily basis. 2D echo with EF of 25-30% with diffuse hypokinesis.  Cardiac enzymes flattened at 0.03.  Weights taken during the hospitalization were unreliable as well as I's and O's.  Patient diuresed well  and improved clinically and subsequently transitioned to oral Lasix.  Patient will be discharged home on Lasix 40 mg daily as well as home regimen of Coreg, Entresto, Aldactone.  Patient will follow up with the heart failure clinic on May 07, 2017.  2.  Chronic kidney disease stage III Remained stable during the hospitalization and diuresis.  Outpatient follow-up.   3.  Iron deficiency anemia Patient noted during the hospitalization to be anemic with hemoglobin dropping as low as 7.0.  Anemia panel obtained was consistent with iron deficiency anemia.  Patient was transfused 2 units packed red blood cells with appropriate response.  By day of discharge hemoglobin was up to 10.0.  Patient also received a dose of IV Feraheme.  Outpatient follow-up.    4.  Hypokalemia/hypomagnesemia Secondary to diuresis.  Repleted.  5.  Tobacco abuse Tobacco cessation. Patient placed on nicotine patch.   6.  Alcohol abuse Patient with no signs of withdrawal. Patient was maintained on ativan withdrawal protocol.      Procedures:  Chest x-ray 04/22/2017  2D  echo 04/23/2017  Transfused 2 units packed red blood cells 04/22/2017-04/23/2017   Consultations:  None  Discharge Exam: Vitals:   04/24/17 2043 04/25/17 0500  BP: 117/77 125/83  Pulse: 92 72  Resp: 20 16  Temp: 98.9 F (37.2 C) 98.2 F (36.8 C)  SpO2: 95% 98%    General: NAD Cardiovascular: RRR Respiratory: Minimal bibasilar crackles.  No wheezing.  Discharge Instructions   Discharge  Instructions    Diet - low sodium heart healthy   Complete by:  As directed    Increase activity slowly   Complete by:  As directed      Allergies as of 04/25/2017      Reactions   Bidil [isosorb Dinitrate-hydralazine] Other (See Comments)   Severe Headache   Imdur [isosorbide Dinitrate] Other (See Comments)   Severe Headache   Bacitracin Rash      Medication List    TAKE these medications   carvedilol 12.5 MG tablet Commonly known as:  COREG Take 1 tablet (12.5 mg total) by mouth 2 (two) times daily with a meal.   clindamycin 300 MG capsule Commonly known as:  CLEOCIN Take 300 mg by mouth 4 (four) times daily.   ENTRESTO 97-103 MG Generic drug:  sacubitril-valsartan TAKE 1 TABLET BY MOUTH TWICE A DAY   folic acid 1 MG tablet Commonly known as:  FOLVITE Take 1 tablet (1 mg total) by mouth daily. Start taking on:  04/26/2017   furosemide 40 MG tablet Commonly known as:  LASIX Take 1 tablet (40 mg total) by mouth daily. What changed:    when to take this  reasons to take this   multivitamin with minerals Tabs tablet Take 1 tablet by mouth daily. Start taking on:  04/26/2017   nicotine 21 mg/24hr patch Commonly known as:  NICODERM CQ - dosed in mg/24 hours Place 1 patch (21 mg total) onto the skin daily. Start taking on:  04/26/2017   spironolactone 25 MG tablet Commonly known as:  ALDACTONE TAKE 1 TABLET BY MOUTH EVERY DAY   thiamine 100 MG tablet Take 1 tablet (100 mg total) by mouth daily. Start taking on:  04/26/2017      Allergies  Allergen Reactions  . Bidil [Isosorb Dinitrate-Hydralazine] Other (See Comments)    Severe Headache  . Imdur [Isosorbide Dinitrate] Other (See Comments)     Severe Headache  . Bacitracin Rash   Follow-up Information    Graciella Freer, PA-C Follow up on 05/07/2017.   Specialty:  Physician Assistant Why:  Hospital Follow-up with the Heart Failure Clinic on 05/07/2017 at 1:30PM. Parking Code will be 9002.  Contact  information: [address] [place] [address] [phone]            The results of significant diagnostics from this hospitalization (including imaging, microbiology, ancillary and laboratory) are listed below for reference.    Significant Diagnostic Studies: Dg Chest 2 View  Result Date: 04/22/2017 CLINICAL DATA:  Productive cough for 1 month. Shortness of breath for 1 week. Congestive heart failure. EXAM: CHEST  2 VIEW COMPARISON:  05/10/2016 FINDINGS: Moderate cardiomegaly again noted. Diffuse interstitial infiltrates with Kerley B-lines, consistent with mild interstitial edema. No evidence of pulmonary consolidation or pleural effusion. IMPRESSION: Mild congestive heart failure. No evidence of pulmonary consolidation or pleural effusion. Electronically Signed   By: Myles Rosenthal M.D.   On: 04/22/2017 12:12    Microbiology: No results found for this or any previous visit (from the past 240 hour(s)).  Labs: Basic Metabolic Panel: Recent Labs  Lab 04/22/17 1257 04/23/17 0749 04/24/17 0423 04/25/17 0614  NA 134* 132* 132* 132*  K 3.5 3.2* 3.8 3.5  CL 106 102 101 100*  CO2 19* 20* 21* 23  GLUCOSE 88 92 94 96  BUN 14 12 18 19   CREATININE 1.16 1.26* 1.31* 1.26*  CALCIUM 8.3* 8.1* 8.0* 8.0*  MG  --  1.6* 2.0  --    Liver Function Tests: Recent Labs  Lab 04/23/17 0749  AST 21  ALT 17  ALKPHOS 126  BILITOT 0.6  PROT 9.2*  ALBUMIN 2.3*   No results for input(s): LIPASE, AMYLASE in the last 168 hours. No results for input(s): AMMONIA in the last 168 hours. CBC: Recent Labs  Lab 04/22/17 1257 04/22/17 2108 04/23/17 0749 04/24/17 0423 04/25/17 0614  WBC 8.6  --  9.7 11.5* 10.7*  NEUTROABS 6.2  --   --   --   --   HGB 7.0*  --  8.3* 9.1* 10.0*  HCT 23.6* 23.8* 27.6* 30.5* 33.2*  MCV 77.4*  --  79.3 79.8 80.4  PLT 464*  --  478* 511* 481*   Cardiac Enzymes: Recent Labs  Lab 04/22/17 2108 04/23/17 0222 04/23/17 0749  TROPONINI <0.03 0.03* 0.03*    BNP: BNP (last 3 results) Recent Labs    06/03/16 1019 07/03/16 0956 04/22/17 1257  BNP 284.2* 95.3 3,878.0*    ProBNP (last 3 results) No results for input(s): PROBNP in the last 8760 hours.  CBG: No results for input(s): GLUCAP in the last 168 hours.     Signed:  Ramiro Harvest MD.  [facility] 04/25/2017, 11:03 AM

## 2017-04-25 NOTE — Discharge Instructions (Signed)
BMET AND CBC ON 3/6

## 2017-04-25 NOTE — Progress Notes (Signed)
Patient ambulated hallway several times during shift with supervision/ no assist.  Patient tolerated walk well, no visible exertion or distress.

## 2017-05-07 ENCOUNTER — Encounter (HOSPITAL_COMMUNITY): Payer: Self-pay | Admitting: Student

## 2017-05-07 ENCOUNTER — Encounter (HOSPITAL_COMMUNITY): Payer: Self-pay

## 2017-05-07 ENCOUNTER — Ambulatory Visit (HOSPITAL_COMMUNITY)
Admit: 2017-05-07 | Discharge: 2017-05-07 | Disposition: A | Discharge status: Home / Self Care | Payer: BLUE CROSS/BLUE SHIELD | Source: Ambulatory Visit | Attending: Internal Medicine | Admitting: Internal Medicine

## 2017-05-07 VITALS — BP 112/62 | HR 84 | Wt 161.0 lb

## 2017-05-07 DIAGNOSIS — N183 Chronic kidney disease, stage 3 unspecified: Secondary | ICD-10-CM

## 2017-05-07 DIAGNOSIS — I428 Other cardiomyopathies: Secondary | ICD-10-CM | POA: Insufficient documentation

## 2017-05-07 DIAGNOSIS — L732 Hidradenitis suppurativa: Secondary | ICD-10-CM | POA: Diagnosis not present

## 2017-05-07 DIAGNOSIS — D509 Iron deficiency anemia, unspecified: Secondary | ICD-10-CM | POA: Insufficient documentation

## 2017-05-07 DIAGNOSIS — Z8701 Personal history of pneumonia (recurrent): Secondary | ICD-10-CM | POA: Diagnosis not present

## 2017-05-07 DIAGNOSIS — Z888 Allergy status to other drugs, medicaments and biological substances status: Secondary | ICD-10-CM | POA: Insufficient documentation

## 2017-05-07 DIAGNOSIS — I5082 Biventricular heart failure: Secondary | ICD-10-CM | POA: Diagnosis not present

## 2017-05-07 DIAGNOSIS — I5022 Chronic systolic (congestive) heart failure: Secondary | ICD-10-CM | POA: Insufficient documentation

## 2017-05-07 DIAGNOSIS — Z79899 Other long term (current) drug therapy: Secondary | ICD-10-CM | POA: Insufficient documentation

## 2017-05-07 DIAGNOSIS — I513 Intracardiac thrombosis, not elsewhere classified: Secondary | ICD-10-CM

## 2017-05-07 DIAGNOSIS — F1010 Alcohol abuse, uncomplicated: Secondary | ICD-10-CM | POA: Insufficient documentation

## 2017-05-07 DIAGNOSIS — Z720 Tobacco use: Secondary | ICD-10-CM

## 2017-05-07 DIAGNOSIS — Z872 Personal history of diseases of the skin and subcutaneous tissue: Secondary | ICD-10-CM

## 2017-05-07 DIAGNOSIS — F17210 Nicotine dependence, cigarettes, uncomplicated: Secondary | ICD-10-CM | POA: Insufficient documentation

## 2017-05-07 DIAGNOSIS — Z8249 Family history of ischemic heart disease and other diseases of the circulatory system: Secondary | ICD-10-CM | POA: Diagnosis not present

## 2017-05-07 LAB — BASIC METABOLIC PANEL
ANION GAP: 8 (ref 5–15)
BUN: 16 mg/dL (ref 6–20)
CALCIUM: 8.6 mg/dL — AB (ref 8.9–10.3)
CO2: 22 mmol/L (ref 22–32)
Chloride: 101 mmol/L (ref 101–111)
Creatinine, Ser: 1.34 mg/dL — ABNORMAL HIGH (ref 0.61–1.24)
GLUCOSE: 109 mg/dL — AB (ref 65–99)
Potassium: 3.9 mmol/L (ref 3.5–5.1)
Sodium: 131 mmol/L — ABNORMAL LOW (ref 135–145)

## 2017-05-07 LAB — CBC
HCT: 33.6 % — ABNORMAL LOW (ref 39.0–52.0)
HEMOGLOBIN: 10.1 g/dL — AB (ref 13.0–17.0)
MCH: 24.1 pg — ABNORMAL LOW (ref 26.0–34.0)
MCHC: 30.1 g/dL (ref 30.0–36.0)
MCV: 80.2 fL (ref 78.0–100.0)
PLATELETS: 397 10*3/uL (ref 150–400)
RBC: 4.19 MIL/uL — AB (ref 4.22–5.81)
RDW: 19.8 % — ABNORMAL HIGH (ref 11.5–15.5)
WBC: 12.2 10*3/uL — ABNORMAL HIGH (ref 4.0–10.5)

## 2017-05-07 MED ORDER — SPIRONOLACTONE 25 MG PO TABS: 25.0000 mg | ORAL_TABLET | Freq: Every day | ORAL | 5 refills | Status: DC

## 2017-05-07 MED ORDER — CARVEDILOL 12.5 MG PO TABS: 12.5000 mg | ORAL_TABLET | Freq: Two times a day (BID) | ORAL | 11 refills | Status: DC

## 2017-05-07 MED ORDER — SACUBITRIL-VALSARTAN 97-103 MG PO TABS: 1.0000 | ORAL_TABLET | Freq: Two times a day (BID) | ORAL | 3 refills | Status: DC
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
## 2017-05-07 NOTE — Patient Instructions (Signed)
Routine lab work today. Will notify you of abnormal results, otherwise no news is good news!  No changes to medication at this time.  Fax FMLA paperwork to [phone] "Attention to: Susie"  Follow up as scheduled with Dr. Gala Romney.  Take all medication as prescribed the day of your appointment. Bring all medications with you to your appointment.  Do the following things EVERYDAY: 1) Weigh yourself in the morning before breakfast. Write it down and keep it in a log. 2) Take your medicines as prescribed 3) Eat low salt foods-Limit salt (sodium) to 2000 mg per day.  4) Stay as active as you can everyday 5) Limit all fluids for the day to less than 2 liters

## 2017-05-28 ENCOUNTER — Other Ambulatory Visit (HOSPITAL_COMMUNITY): Payer: Self-pay | Admitting: *Deleted

## 2017-05-28 MED ORDER — FUROSEMIDE 40 MG PO TABS: 40.0000 mg | ORAL_TABLET | Freq: Every day | ORAL | 3 refills | Status: DC

## 2017-06-13 IMAGING — CT CT ABD-PELV W/ CM
2 of 4 series · 14 of 46 positions shown, 16 images · IV contrast (iopamidol)
Comparison: Abdominal ultrasound performed 05/10/2016

CLINICAL DATA: Chronic leakage of blood from abscesses on the
buttocks. Patient on blood thinners. Initial encounter.

EXAM:
CT ABDOMEN AND PELVIS WITH CONTRAST
TECHNIQUE: Multidetector CT imaging of the abdomen and pelvis was performed
using the standard protocol following bolus administration of
intravenous contrast.
CONTRAST:  100mL FAX3SQ-WAA IOPAMIDOL (FAX3SQ-WAA) INJECTION 61%

[Series 7: coronal st · coronal · 0.64mm/px · 3 of 85 slices shown]
[im 29/85  soft-tissue]
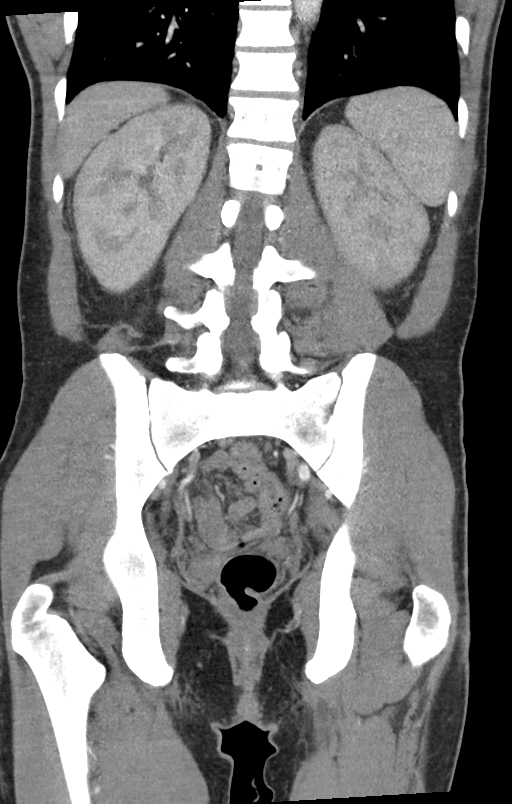
[im 38/85  soft-tissue]
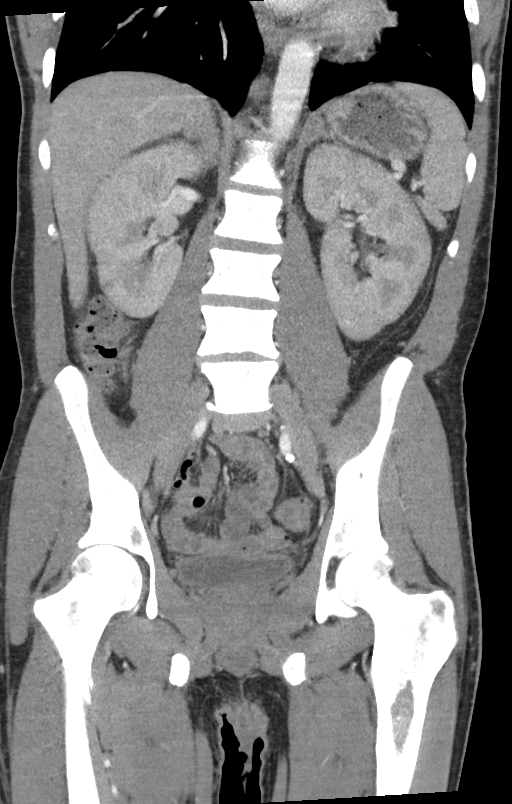
[im 47/85  soft-tissue]
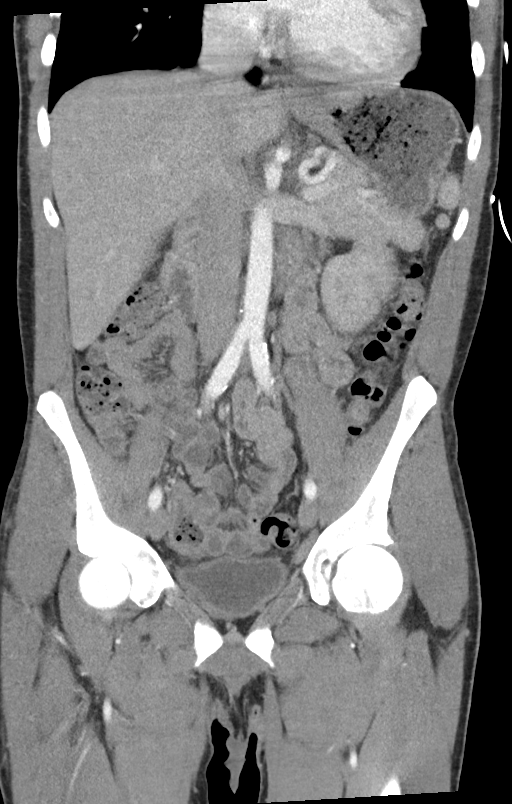

[Series 8: axial st · axial · 0.77mm/px · z∈[+842,+1267]mm · 11 of 103 slices shown, 13 images]
[im 9/103  soft-tissue]
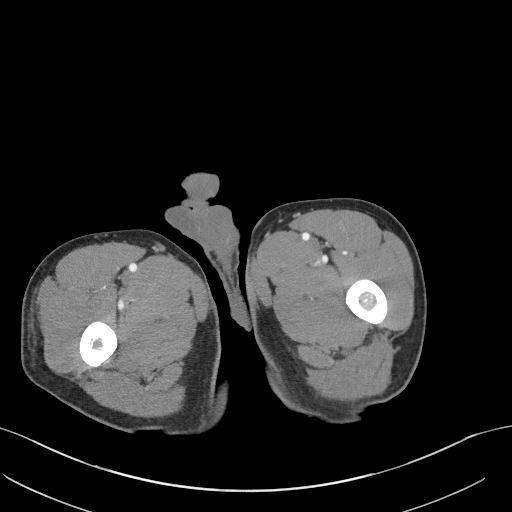
[im 9/103  bone]
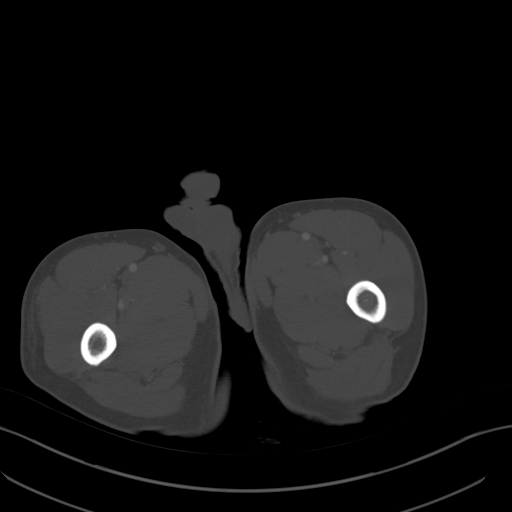
[im 18/103  soft-tissue]
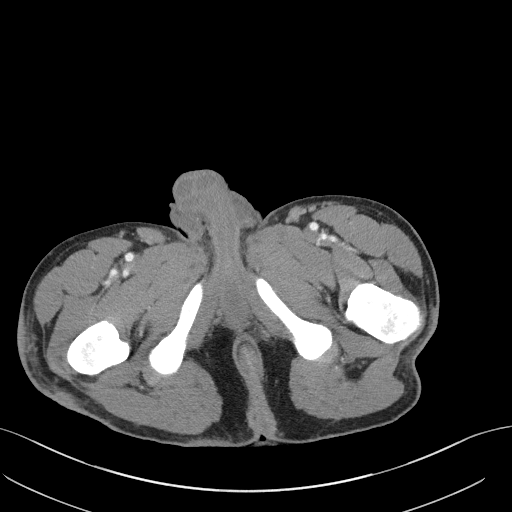
[im 26/103  soft-tissue]
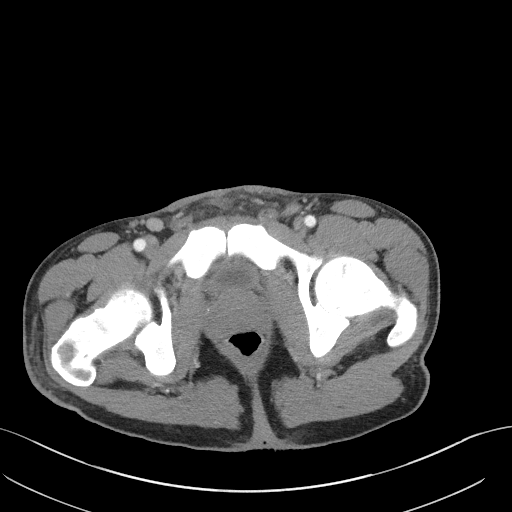
[im 35/103  soft-tissue]
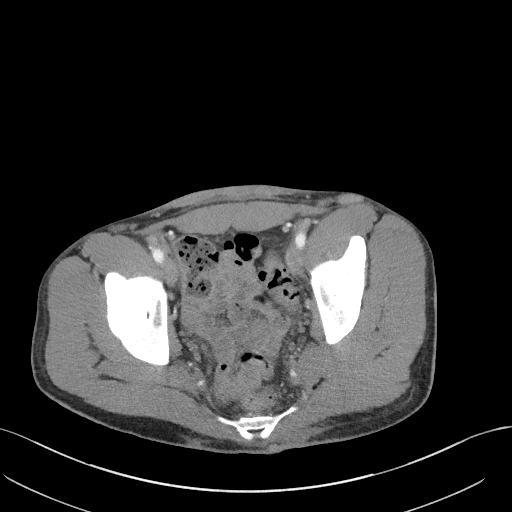
[im 43/103  soft-tissue]
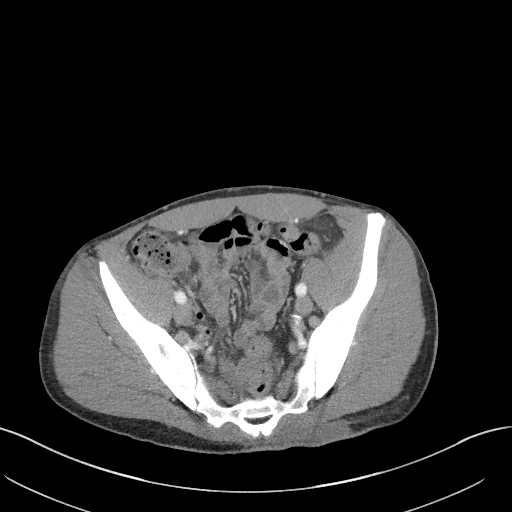
[im 52/103  soft-tissue]
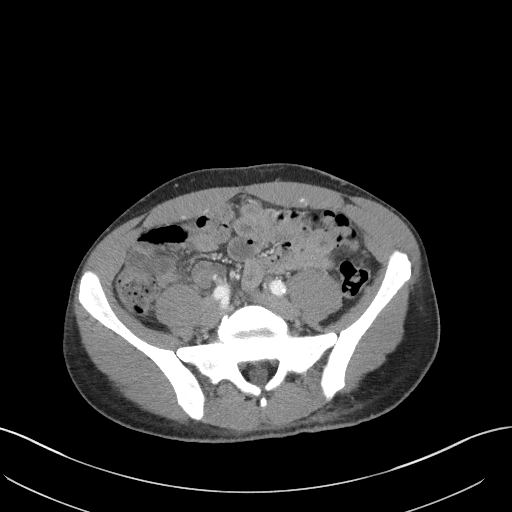
[im 60/103  soft-tissue]
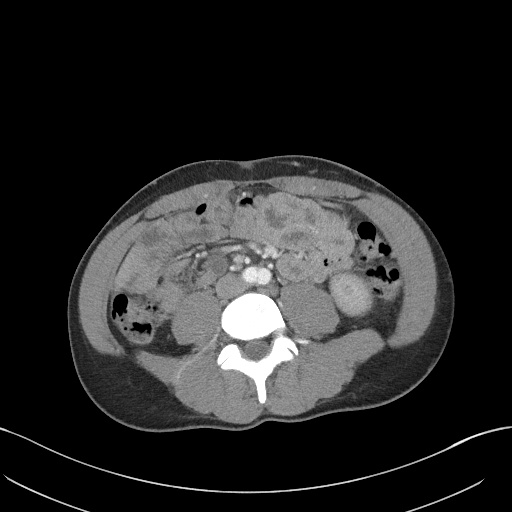
[im 69/103  soft-tissue]
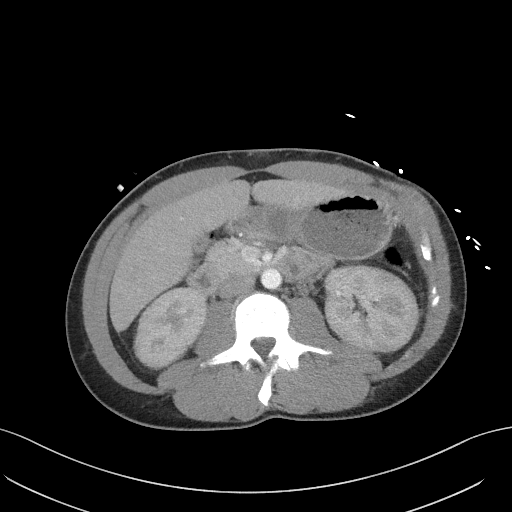
[im 77/103  soft-tissue]
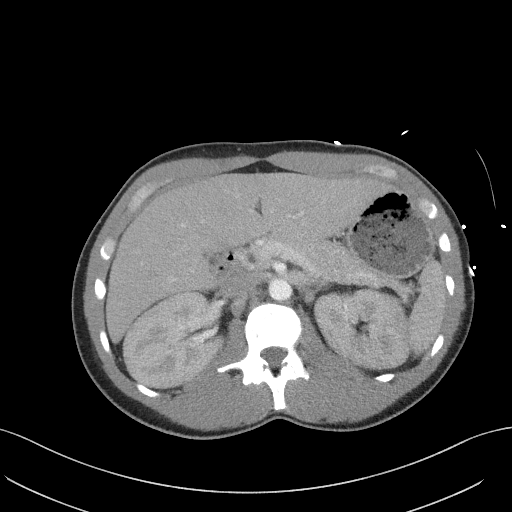
[im 77/103  bone]
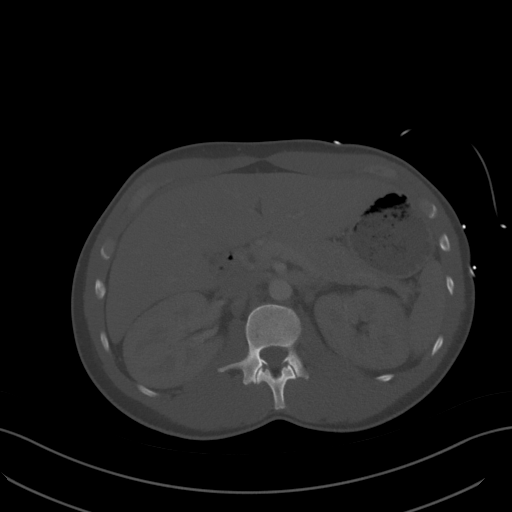
[im 86/103  soft-tissue]
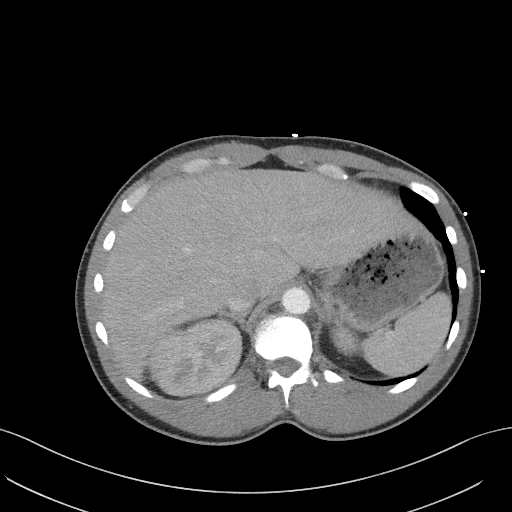
[im 94/103  soft-tissue]
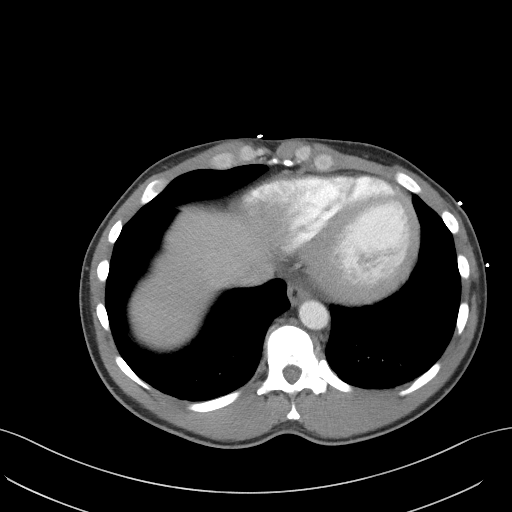

[14 of 46 positions shown; findings below may reference images not displayed]

FINDINGS: Lower chest: The visualized lung bases are grossly clear. The
visualized portions of the mediastinum are unremarkable.

Hepatobiliary: The liver is unremarkable in appearance. The
gallbladder is unremarkable in appearance. The common bile duct
remains normal in caliber.

Pancreas: The pancreas is within normal limits.

Spleen: The spleen is unremarkable in appearance.

Adrenals/Urinary Tract: The adrenal glands are unremarkable in
appearance.

Minimally decreased attenuation is noted in the periphery of both
kidneys, concerning for mild pyelonephritis.

There is no evidence of hydronephrosis. No renal or ureteral stones
are identified. No perinephric stranding is seen.

Stomach/Bowel: The stomach is unremarkable in appearance. The small
bowel is within normal limits. The appendix is normal in caliber,
without evidence of appendicitis. The colon is unremarkable in
appearance.

Minimal diverticulosis is noted at the distal descending colon.

Vascular/Lymphatic: The abdominal aorta is unremarkable in
appearance. The inferior vena cava is grossly unremarkable. No
retroperitoneal lymphadenopathy is seen. No pelvic sidewall
lymphadenopathy is identified.

Reproductive: The bladder is mildly distended and grossly
unremarkable. The prostate remains normal in size.

Other: A 3.0 cm subcutaneous collection of fluid is noted superior
to the umbilicus, with mild peripheral enhancement, possibly
reflecting a small abscess.

Diffuse irregular skin thickening is noted along the anterior lower
abdominal wall, with scattered tiny foci of air, concerning for
recently drained abscesses. Underlying bilateral inguinal nodes are
borderline normal in size.

Diffuse skin thickening is noted bilaterally along the gluteal
cleft, and subcutaneous edema and fluid tracks superiorly along the
lower left flank. Skin thickening and subcutaneous edema are also
noted overlying the right hip.

Musculoskeletal: No acute osseous abnormalities are identified. The
visualized musculature is unremarkable in appearance.
IMPRESSION: 1. 3.0 cm subcutaneous collection of fluid superior to the
umbilicus, with mild peripheral enhancement, possibly reflecting a
small abscess.
2. Diffuse irregular skin thickening along the lower anterior
abdominal wall, with scattered tiny foci of air, concerning for
recently drained abscesses.
3. Diffuse skin thickening bilaterally along the gluteal cleft, and
subcutaneous edema and fluid tracking superiorly along the left
lower flank. Skin thickening and subcutaneous edema overlying the
right hip.
4. Minimally decreased attenuation in the periphery of both kidneys,
concerning for mild pyelonephritis. Would correlate for any
associated symptoms or lab findings.
5. Minimal diverticulosis at the distal descending colon, without
evidence of diverticulitis.

## 2017-07-02 ENCOUNTER — Other Ambulatory Visit: Payer: Self-pay

## 2017-07-02 ENCOUNTER — Ambulatory Visit (HOSPITAL_COMMUNITY)
Admission: RE | Admit: 2017-07-02 | Discharge: 2017-07-02 | Disposition: A | Discharge status: Home / Self Care | Payer: BLUE CROSS/BLUE SHIELD | Source: Ambulatory Visit | Attending: Internal Medicine | Admitting: Internal Medicine

## 2017-07-02 ENCOUNTER — Encounter (HOSPITAL_COMMUNITY): Payer: Self-pay | Admitting: Internal Medicine

## 2017-07-02 VITALS — BP 121/69 | HR 82 | Wt 163.8 lb

## 2017-07-02 DIAGNOSIS — Z79899 Other long term (current) drug therapy: Secondary | ICD-10-CM | POA: Insufficient documentation

## 2017-07-02 DIAGNOSIS — I5022 Chronic systolic (congestive) heart failure: Secondary | ICD-10-CM | POA: Diagnosis not present

## 2017-07-02 DIAGNOSIS — Z720 Tobacco use: Secondary | ICD-10-CM | POA: Diagnosis not present

## 2017-07-02 DIAGNOSIS — F1010 Alcohol abuse, uncomplicated: Secondary | ICD-10-CM

## 2017-07-02 DIAGNOSIS — I429 Cardiomyopathy, unspecified: Secondary | ICD-10-CM | POA: Insufficient documentation

## 2017-07-02 DIAGNOSIS — Z8701 Personal history of pneumonia (recurrent): Secondary | ICD-10-CM | POA: Insufficient documentation

## 2017-07-02 DIAGNOSIS — F17210 Nicotine dependence, cigarettes, uncomplicated: Secondary | ICD-10-CM | POA: Diagnosis not present

## 2017-07-02 DIAGNOSIS — N183 Chronic kidney disease, stage 3 (moderate): Secondary | ICD-10-CM | POA: Insufficient documentation

## 2017-07-02 DIAGNOSIS — Z8249 Family history of ischemic heart disease and other diseases of the circulatory system: Secondary | ICD-10-CM | POA: Diagnosis not present

## 2017-07-02 DIAGNOSIS — D649 Anemia, unspecified: Secondary | ICD-10-CM | POA: Insufficient documentation

## 2017-07-02 LAB — BASIC METABOLIC PANEL
ANION GAP: 7 (ref 5–15)
BUN: 11 mg/dL (ref 6–20)
CHLORIDE: 102 mmol/L (ref 101–111)
CO2: 26 mmol/L (ref 22–32)
Calcium: 8.5 mg/dL — ABNORMAL LOW (ref 8.9–10.3)
Creatinine, Ser: 1.1 mg/dL (ref 0.61–1.24)
GFR calc Af Amer: >60 mL/min (ref >60)
GLUCOSE: 104 mg/dL — AB (ref 65–99)
POTASSIUM: 3.7 mmol/L (ref 3.5–5.1)
Sodium: 135 mmol/L (ref 135–145)

## 2017-07-02 LAB — BRAIN NATRIURETIC PEPTIDE: B NATRIURETIC PEPTIDE 5: 303.3 pg/mL — AB (ref 0.0–100.0)

## 2017-07-02 MED ORDER — CARVEDILOL 12.5 MG PO TABS
18.7500 mg | ORAL_TABLET | Freq: Two times a day (BID) | ORAL | 6 refills | Status: DC
Start: 2017-07-02 — End: 2017-08-14

## 2017-07-02 NOTE — Patient Instructions (Signed)
Increase Carvedilol to 18.75 mg (1 & 1/2 tabs) Twice daily   Labs today  Your physician has requested that you have a cardiac MRI. Cardiac MRI uses a computer to create images of your heart as its beating, producing both still and moving pictures of your heart and major blood vessels. For further information please visit [email or web address]. Please follow the instruction sheet given to you today for more information.  ONCE APPROVED BY YOUR INSURANCE COMPANY RADIOLOGY WILL CALL YOU TO SCHEDULE.  Your physician recommends that you schedule a follow-up appointment in: 6 weeks

## 2017-07-02 NOTE — Addendum Note (Signed)
Encounter addended by: Noralee Space, RN on: 07/02/2017 4:02 PM  Actions taken: Order list changed, Diagnosis association updated, Sign clinical note
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
## 2017-07-08 ENCOUNTER — Encounter (HOSPITAL_COMMUNITY): Payer: Self-pay | Admitting: *Deleted

## 2017-07-08 NOTE — Progress Notes (Signed)
Received disability forms from [facility].  Forms completed/signed and faxed today to [phone].    CLAIM # [id number]  Original forms will be scanned to patient's electronic medical record.

## 2017-07-09 ENCOUNTER — Telehealth: Payer: Self-pay | Admitting: Internal Medicine

## 2017-07-09 NOTE — Telephone Encounter (Signed)
Called patient and LVM to call back to let me know what time of day he would like his cardiac MRI scheduled.

## 2017-07-16 ENCOUNTER — Telehealth: Payer: Self-pay | Admitting: Internal Medicine

## 2017-07-16 NOTE — Telephone Encounter (Signed)
Called patient and LVM to call me with the time of day he wants his cardiac MRI scheduled.  This is the second call.

## 2017-07-18 ENCOUNTER — Telehealth: Payer: Self-pay | Admitting: Internal Medicine

## 2017-07-18 NOTE — Telephone Encounter (Signed)
Called patient and LVM to call back to let me know what time of day he wants the cardiac MRI scheduled.

## 2017-07-25 ENCOUNTER — Emergency Department (HOSPITAL_COMMUNITY): Payer: BLUE CROSS/BLUE SHIELD

## 2017-07-25 ENCOUNTER — Encounter (HOSPITAL_COMMUNITY): Payer: Self-pay | Admitting: Emergency Medicine

## 2017-07-25 ENCOUNTER — Other Ambulatory Visit: Payer: Self-pay

## 2017-07-25 ENCOUNTER — Emergency Department (HOSPITAL_COMMUNITY)
Admission: EM | Admit: 2017-07-25 | Discharge: 2017-07-25 | Disposition: A | Discharge status: Home / Self Care | Payer: BLUE CROSS/BLUE SHIELD | Attending: Emergency Medicine | Admitting: Emergency Medicine

## 2017-07-25 DIAGNOSIS — R42 Dizziness and giddiness: Secondary | ICD-10-CM | POA: Insufficient documentation

## 2017-07-25 DIAGNOSIS — D649 Anemia, unspecified: Secondary | ICD-10-CM | POA: Diagnosis not present

## 2017-07-25 DIAGNOSIS — R55 Syncope and collapse: Secondary | ICD-10-CM

## 2017-07-25 DIAGNOSIS — Z79899 Other long term (current) drug therapy: Secondary | ICD-10-CM | POA: Diagnosis not present

## 2017-07-25 DIAGNOSIS — I5022 Chronic systolic (congestive) heart failure: Secondary | ICD-10-CM | POA: Insufficient documentation

## 2017-07-25 DIAGNOSIS — I428 Other cardiomyopathies: Secondary | ICD-10-CM | POA: Diagnosis not present

## 2017-07-25 DIAGNOSIS — F17210 Nicotine dependence, cigarettes, uncomplicated: Secondary | ICD-10-CM | POA: Diagnosis not present

## 2017-07-25 LAB — BRAIN NATRIURETIC PEPTIDE: B NATRIURETIC PEPTIDE 5: 501 pg/mL — AB (ref 0.0–100.0)

## 2017-07-25 LAB — COMPREHENSIVE METABOLIC PANEL
ALK PHOS: 63 U/L (ref 38–126)
ALT: 12 U/L — ABNORMAL LOW (ref 17–63)
AST: 18 U/L (ref 15–41)
Albumin: 2.4 g/dL — ABNORMAL LOW (ref 3.5–5.0)
Anion gap: 6 (ref 5–15)
BUN: 15 mg/dL (ref 6–20)
CALCIUM: 8.3 mg/dL — AB (ref 8.9–10.3)
CHLORIDE: 100 mmol/L — AB (ref 101–111)
CO2: 26 mmol/L (ref 22–32)
CREATININE: 1.19 mg/dL (ref 0.61–1.24)
GFR calc non Af Amer: >60 mL/min (ref >60)
GLUCOSE: 101 mg/dL — AB (ref 65–99)
Potassium: 3.6 mmol/L (ref 3.5–5.1)
SODIUM: 132 mmol/L — AB (ref 135–145)
Total Bilirubin: 0.4 mg/dL (ref 0.3–1.2)
Total Protein: 9.9 g/dL — ABNORMAL HIGH (ref 6.5–8.1)

## 2017-07-25 LAB — CBC WITH DIFFERENTIAL/PLATELET
BASOS PCT: 0 %
Basophils Absolute: 0 10*3/uL (ref 0.0–0.1)
EOS ABS: 0.4 10*3/uL (ref 0.0–0.7)
EOS PCT: 4 %
HCT: 26.3 % — ABNORMAL LOW (ref 39.0–52.0)
Hemoglobin: 8 g/dL — ABNORMAL LOW (ref 13.0–17.0)
LYMPHS ABS: 2.2 10*3/uL (ref 0.7–4.0)
Lymphocytes Relative: 18 %
MCH: 24.2 pg — AB (ref 26.0–34.0)
MCHC: 30.4 g/dL (ref 30.0–36.0)
MCV: 79.7 fL (ref 78.0–100.0)
MONO ABS: 1 10*3/uL (ref 0.1–1.0)
MONOS PCT: 8 %
NEUTROS PCT: 70 %
Neutro Abs: 8.7 10*3/uL — ABNORMAL HIGH (ref 1.7–7.7)
PLATELETS: 423 10*3/uL — AB (ref 150–400)
RBC: 3.3 MIL/uL — ABNORMAL LOW (ref 4.22–5.81)
RDW: 18.7 % — ABNORMAL HIGH (ref 11.5–15.5)
WBC: 12.3 10*3/uL — ABNORMAL HIGH (ref 4.0–10.5)

## 2017-07-25 LAB — CBG MONITORING, ED: Glucose-Capillary: 124 mg/dL — ABNORMAL HIGH (ref 65–99)
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
## 2017-07-25 NOTE — Discharge Instructions (Signed)
Specific cause of today's events not clear.  However you are showing signs of an anemia.  As we discussed it sounds like you had trouble with this in the past.  Would recommend close follow-up with your primary care doctor up in the [place] area.  Return for any recurrent new or worse symptoms.  Chest x-ray was negative today no signs of congestive heart failure.

## 2017-07-25 NOTE — ED Triage Notes (Signed)
Patient states he walked outside to his truck and when he looked in the window he became dizzy and felt like he was going to pass out. States he fell forward and hit his forehead on the window. Denies LOC. Denies chest pain.

## 2017-07-30 ENCOUNTER — Encounter: Payer: Self-pay | Admitting: Internal Medicine

## 2017-08-01 ENCOUNTER — Ambulatory Visit (HOSPITAL_COMMUNITY): Payer: BLUE CROSS/BLUE SHIELD

## 2017-08-05 ENCOUNTER — Ambulatory Visit (HOSPITAL_COMMUNITY)
Admission: RE | Admit: 2017-08-05 | Discharge: 2017-08-05 | Disposition: A | Discharge status: Home / Self Care | Payer: BLUE CROSS/BLUE SHIELD | Source: Ambulatory Visit | Attending: Internal Medicine | Admitting: Internal Medicine

## 2017-08-05 DIAGNOSIS — I5022 Chronic systolic (congestive) heart failure: Secondary | ICD-10-CM | POA: Insufficient documentation

## 2017-08-05 MED ORDER — GADOBENATE DIMEGLUMINE 529 MG/ML IV SOLN
24.0000 mL | Freq: Once | INTRAVENOUS | Status: AC | PRN
  Administered 2017-08-05: 24 mL via INTRAVENOUS

## 2017-08-14 ENCOUNTER — Ambulatory Visit (HOSPITAL_COMMUNITY)
Admission: RE | Admit: 2017-08-14 | Discharge: 2017-08-14 | Disposition: A | Discharge status: Home / Self Care | Payer: BLUE CROSS/BLUE SHIELD | Source: Ambulatory Visit | Attending: Internal Medicine | Admitting: Internal Medicine

## 2017-08-14 VITALS — BP 122/76 | HR 85 | Wt 162.8 lb

## 2017-08-14 DIAGNOSIS — I5082 Biventricular heart failure: Secondary | ICD-10-CM | POA: Diagnosis not present

## 2017-08-14 DIAGNOSIS — I5022 Chronic systolic (congestive) heart failure: Secondary | ICD-10-CM | POA: Diagnosis not present

## 2017-08-14 DIAGNOSIS — Z888 Allergy status to other drugs, medicaments and biological substances status: Secondary | ICD-10-CM | POA: Diagnosis not present

## 2017-08-14 DIAGNOSIS — F1010 Alcohol abuse, uncomplicated: Secondary | ICD-10-CM

## 2017-08-14 DIAGNOSIS — N183 Chronic kidney disease, stage 3 (moderate): Secondary | ICD-10-CM | POA: Insufficient documentation

## 2017-08-14 DIAGNOSIS — Z79899 Other long term (current) drug therapy: Secondary | ICD-10-CM | POA: Diagnosis not present

## 2017-08-14 DIAGNOSIS — F17210 Nicotine dependence, cigarettes, uncomplicated: Secondary | ICD-10-CM | POA: Insufficient documentation

## 2017-08-14 DIAGNOSIS — I219 Acute myocardial infarction, unspecified: Secondary | ICD-10-CM | POA: Insufficient documentation

## 2017-08-14 DIAGNOSIS — Z8249 Family history of ischemic heart disease and other diseases of the circulatory system: Secondary | ICD-10-CM | POA: Diagnosis not present

## 2017-08-14 DIAGNOSIS — L732 Hidradenitis suppurativa: Secondary | ICD-10-CM | POA: Insufficient documentation

## 2017-08-14 DIAGNOSIS — Z09 Encounter for follow-up examination after completed treatment for conditions other than malignant neoplasm: Secondary | ICD-10-CM | POA: Insufficient documentation

## 2017-08-14 DIAGNOSIS — D509 Iron deficiency anemia, unspecified: Secondary | ICD-10-CM | POA: Insufficient documentation

## 2017-08-14 LAB — BASIC METABOLIC PANEL
ANION GAP: 9 (ref 5–15)
BUN: 11 mg/dL (ref 6–20)
CALCIUM: 8.8 mg/dL — AB (ref 8.9–10.3)
CO2: 25 mmol/L (ref 22–32)
Chloride: 102 mmol/L (ref 101–111)
Creatinine, Ser: 1.14 mg/dL (ref 0.61–1.24)
GFR calc Af Amer: >60 mL/min (ref >60)
GLUCOSE: 82 mg/dL (ref 65–99)
Potassium: 3.8 mmol/L (ref 3.5–5.1)
Sodium: 136 mmol/L (ref 135–145)

## 2017-08-14 LAB — CBC
HCT: 27.7 % — ABNORMAL LOW (ref 39.0–52.0)
HEMOGLOBIN: 8.4 g/dL — AB (ref 13.0–17.0)
MCH: 24.9 pg — AB (ref 26.0–34.0)
MCHC: 30.3 g/dL (ref 30.0–36.0)
MCV: 82 fL (ref 78.0–100.0)
Platelets: 424 10*3/uL — ABNORMAL HIGH (ref 150–400)
RBC: 3.38 MIL/uL — ABNORMAL LOW (ref 4.22–5.81)
RDW: 18.1 % — AB (ref 11.5–15.5)
WBC: 12.4 10*3/uL — ABNORMAL HIGH (ref 4.0–10.5)

## 2017-08-14 MED ORDER — CARVEDILOL 12.5 MG PO TABS: 18.7500 mg | ORAL_TABLET | Freq: Two times a day (BID) | ORAL | 6 refills | Status: DC
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
## 2017-08-14 NOTE — Patient Instructions (Signed)
INCREASE Carvedilol to 18.75 mg (1.5 Tablet) Twice Daily  Labs today (will call for abnormal results, otherwise no news is good news)  Follow up in 3 months.

## 2017-08-29 ENCOUNTER — Telehealth (HOSPITAL_COMMUNITY): Payer: Self-pay | Admitting: *Deleted

## 2017-08-29 NOTE — Telephone Encounter (Signed)
Patient requested for OV notes to be faxed to Dr. Ronnell Guadalajara office.  OV notes faxed today to [phone]

## 2017-10-09 ENCOUNTER — Other Ambulatory Visit (HOSPITAL_COMMUNITY): Payer: Self-pay | Admitting: Student

## 2017-10-10 ENCOUNTER — Other Ambulatory Visit (HOSPITAL_COMMUNITY): Payer: Self-pay | Admitting: Student

## 2017-10-30 ENCOUNTER — Telehealth (HOSPITAL_COMMUNITY): Payer: Self-pay | Admitting: Cardiology

## 2017-10-30 NOTE — Telephone Encounter (Signed)
Medication Samples have been provided to the patient.  Drug name: entresto       Strength: 49/51        Qty: 28  LOT: [id number]  Exp.Date: 02/2020  Dosing instructions: TWO tabs po twice daily  The patient has been instructed regarding the correct time, dose, and frequency of taking this medication, including desired effects and most common side effects.   Paul Hahn 4:31 PM 10/30/2017  Patient assistance application attached

## 2017-10-30 NOTE — Telephone Encounter (Signed)
Patient called to inform office he no longer has insurance and cannot afford medications, reports he has been without medications x 3 days.   Advised I would route message to the HF pharmacist and social worker to further discuss options

## 2017-10-31 ENCOUNTER — Telehealth: Payer: Self-pay | Admitting: Licensed Clinical Social Worker

## 2017-10-31 MED FILL — SPIRONOLACTONE 25 MG TABLET: 25 | 34 days supply | Qty: 34 | Fill #0

## 2017-10-31 MED FILL — CARVEDILOL 12.5 MG TABLET: 12.5 | 34 days supply | Qty: 68 | Fill #0

## 2017-10-31 MED FILL — FUROSEMIDE 40 MG TAB: 40 | 30 days supply | Qty: 100 | Fill #0

## 2017-10-31 NOTE — Telephone Encounter (Signed)
CSW received referral from the clinic staff to assist patient with insurance. CSW contacted patient via phone to follow up and informed that he lost his insurance due to "changes on the job". Patient resides in [place] and states he needs his medications. Clinic staff assisted with medications through the HF fund and CSW discussed [facility] and provided number. Patient will speak further with the Pharmacist regarding patient assistance programs for [facility]. CSW informed patient to pick up meds at the Outpatient pharmacy. Patient verbalizes understanding and grateful for the assistance. Lasandra Beech, LCSW, CCSW-MCS [phone]

## 2017-11-06 ENCOUNTER — Other Ambulatory Visit (HOSPITAL_COMMUNITY): Payer: Self-pay | Admitting: Cardiology

## 2017-11-10 MED ORDER — SACUBITRIL-VALSARTAN 97-103 MG PO TABS: 1.0000 | ORAL_TABLET | Freq: Two times a day (BID) | ORAL | 11 refills | Status: DC

## 2017-11-10 NOTE — Telephone Encounter (Signed)
rx printed to accompany patient assistance application  

## 2017-11-20 ENCOUNTER — Other Ambulatory Visit: Payer: Self-pay

## 2017-11-20 ENCOUNTER — Encounter (HOSPITAL_COMMUNITY): Payer: Self-pay | Admitting: Internal Medicine

## 2017-11-20 ENCOUNTER — Ambulatory Visit (HOSPITAL_COMMUNITY)
Admission: RE | Admit: 2017-11-20 | Discharge: 2017-11-20 | Disposition: A | Discharge status: Home / Self Care | Payer: Self-pay | Source: Ambulatory Visit | Attending: Internal Medicine | Admitting: Internal Medicine

## 2017-11-20 VITALS — BP 118/76 | HR 96 | Wt 156.2 lb

## 2017-11-20 DIAGNOSIS — I5082 Biventricular heart failure: Secondary | ICD-10-CM | POA: Insufficient documentation

## 2017-11-20 DIAGNOSIS — I5022 Chronic systolic (congestive) heart failure: Secondary | ICD-10-CM

## 2017-11-20 DIAGNOSIS — Z79899 Other long term (current) drug therapy: Secondary | ICD-10-CM | POA: Insufficient documentation

## 2017-11-20 DIAGNOSIS — Z720 Tobacco use: Secondary | ICD-10-CM

## 2017-11-20 DIAGNOSIS — I429 Cardiomyopathy, unspecified: Secondary | ICD-10-CM | POA: Insufficient documentation

## 2017-11-20 DIAGNOSIS — N183 Chronic kidney disease, stage 3 (moderate): Secondary | ICD-10-CM | POA: Insufficient documentation

## 2017-11-20 DIAGNOSIS — L732 Hidradenitis suppurativa: Secondary | ICD-10-CM | POA: Insufficient documentation

## 2017-11-20 DIAGNOSIS — F1010 Alcohol abuse, uncomplicated: Secondary | ICD-10-CM

## 2017-11-20 MED ORDER — CARVEDILOL 25 MG PO TABS: 25.0000 mg | ORAL_TABLET | Freq: Two times a day (BID) | ORAL | 3 refills | Status: DC
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
## 2017-11-20 NOTE — Addendum Note (Signed)
Encounter addended by: Modesta Messing, CMA on: 11/20/2017 3:21 PM  Actions taken: Diagnosis association updated, Order list changed, Sign clinical note

## 2017-11-20 NOTE — Patient Instructions (Signed)
INCREASE Carvedilol to 25mg  twice daily.  Follow up and echo with Dr.Bensimhon in 2 months.

## 2017-11-21 ENCOUNTER — Encounter (HOSPITAL_COMMUNITY): Payer: Self-pay | Admitting: Pharmacist

## 2017-11-27 ENCOUNTER — Telehealth (HOSPITAL_COMMUNITY): Payer: Self-pay | Admitting: Pharmacist

## 2017-11-27 NOTE — Telephone Encounter (Signed)
[employer] patient assistance temporarily approved Entresto 97-103 mg BID for 3 months until patient applies for and is denied by [place]. Letter mailed to patient with this information.   Tyler Deis. Bonnye Fava, PharmD, BCPS, CPP Clinical Pharmacist Phone: [phone] 11/27/2017 9:49 AM

## 2018-01-23 ENCOUNTER — Other Ambulatory Visit (HOSPITAL_COMMUNITY): Payer: Self-pay

## 2018-01-23 ENCOUNTER — Encounter (HOSPITAL_COMMUNITY): Payer: Self-pay | Admitting: Internal Medicine

## 2018-02-18 ENCOUNTER — Ambulatory Visit (HOSPITAL_COMMUNITY): Payer: Self-pay

## 2018-02-18 ENCOUNTER — Encounter (HOSPITAL_COMMUNITY): Payer: Self-pay | Admitting: Internal Medicine

## 2018-03-09 ENCOUNTER — Telehealth (HOSPITAL_COMMUNITY): Payer: Self-pay

## 2018-03-09 NOTE — Telephone Encounter (Signed)
Entresto 97-103mg  approved through 03/09/2019 [employer]

## 2018-03-09 NOTE — Telephone Encounter (Signed)
PA for Entresto 97-103mg  sent to [facility] for [employer]

## 2018-04-24 ENCOUNTER — Telehealth (HOSPITAL_COMMUNITY): Payer: Self-pay | Admitting: Vascular Surgery

## 2018-04-24 NOTE — Telephone Encounter (Signed)
Left pt message, db will be out of office 3/9 , pt appt was moved to 05/20/18 @ 3 echo and 4 pm w/ db , asked pt to call back to confirm appt

## 2018-05-11 ENCOUNTER — Encounter (HOSPITAL_COMMUNITY): Payer: Self-pay | Admitting: Internal Medicine

## 2018-05-11 ENCOUNTER — Other Ambulatory Visit (HOSPITAL_COMMUNITY): Payer: Self-pay

## 2018-05-20 ENCOUNTER — Ambulatory Visit (HOSPITAL_COMMUNITY): Payer: Medicaid - Out of State

## 2018-05-20 ENCOUNTER — Encounter (HOSPITAL_COMMUNITY): Payer: Self-pay | Admitting: Internal Medicine

## 2018-05-26 ENCOUNTER — Ambulatory Visit (HOSPITAL_COMMUNITY)
Admission: RE | Admit: 2018-05-26 | Discharge: 2018-05-26 | Disposition: A | Discharge status: Home / Self Care | Payer: Medicaid - Out of State | Source: Ambulatory Visit | Attending: Cardiology | Admitting: Cardiology

## 2018-05-26 ENCOUNTER — Other Ambulatory Visit: Payer: Self-pay

## 2018-06-23 ENCOUNTER — Other Ambulatory Visit (HOSPITAL_COMMUNITY): Payer: Self-pay | Admitting: Student

## 2018-07-30 ENCOUNTER — Encounter (HOSPITAL_COMMUNITY): Payer: Self-pay | Admitting: Internal Medicine

## 2018-07-30 ENCOUNTER — Other Ambulatory Visit (HOSPITAL_COMMUNITY): Payer: Self-pay | Admitting: Student

## 2018-07-30 ENCOUNTER — Other Ambulatory Visit (HOSPITAL_COMMUNITY): Payer: Medicaid - Out of State

## 2018-08-25 ENCOUNTER — Other Ambulatory Visit (HOSPITAL_COMMUNITY): Payer: Self-pay | Admitting: Internal Medicine

## 2018-10-01 ENCOUNTER — Other Ambulatory Visit (HOSPITAL_COMMUNITY): Payer: Self-pay

## 2018-10-01 MED ORDER — ENTRESTO 97-103 MG PO TABS: 1.0000 | ORAL_TABLET | Freq: Two times a day (BID) | ORAL | 11 refills | Status: DC

## 2018-12-08 ENCOUNTER — Telehealth (HOSPITAL_COMMUNITY): Payer: Self-pay | Admitting: *Deleted

## 2018-12-08 NOTE — Telephone Encounter (Signed)
Received a call from pts dermatologists requesting pt have an appt to be evaluated by Dr.Bensimhon before starting him on a new medication to treat hidradenitis suppurativa.  Called pt to schedule appt no answer/left vm requesting pt call back to schedule appt.

## 2018-12-09 ENCOUNTER — Telehealth (HOSPITAL_COMMUNITY): Payer: Self-pay | Admitting: Cardiology

## 2018-12-09 NOTE — Telephone Encounter (Signed)
Dr. Nevin Bloodgood with [facility] dermatology called to clarify upcoming appointment request. Patient is scheduled 10/8 and Dr Nevin Bloodgood would like to start a TNS-alpha inhibitor for treatment, would like to make sure this ok with cardiology.  Also requesting baseline echo results.  Feel free to call with update [phone]   appt notes updated

## 2018-12-09 NOTE — Telephone Encounter (Signed)
Returned call to Dr Nevin Bloodgood to notify we have received message and appt notes have been updated  Requested results to be emailed to [email or web address] 

## 2018-12-10 ENCOUNTER — Ambulatory Visit (HOSPITAL_COMMUNITY)
Admission: RE | Admit: 2018-12-10 | Discharge: 2018-12-10 | Disposition: A | Discharge status: Home / Self Care | Payer: Medicaid - Out of State | Source: Ambulatory Visit | Attending: Internal Medicine | Admitting: Internal Medicine

## 2018-12-10 ENCOUNTER — Encounter (HOSPITAL_COMMUNITY): Payer: Self-pay | Admitting: Internal Medicine

## 2018-12-10 ENCOUNTER — Other Ambulatory Visit: Payer: Self-pay

## 2018-12-10 VITALS — BP 90/60 | HR 91 | Wt 149.4 lb

## 2018-12-10 DIAGNOSIS — Z720 Tobacco use: Secondary | ICD-10-CM | POA: Diagnosis not present

## 2018-12-10 DIAGNOSIS — R531 Weakness: Secondary | ICD-10-CM | POA: Diagnosis not present

## 2018-12-10 DIAGNOSIS — F17210 Nicotine dependence, cigarettes, uncomplicated: Secondary | ICD-10-CM | POA: Insufficient documentation

## 2018-12-10 DIAGNOSIS — Z872 Personal history of diseases of the skin and subcutaneous tissue: Secondary | ICD-10-CM | POA: Diagnosis not present

## 2018-12-10 DIAGNOSIS — N183 Chronic kidney disease, stage 3 unspecified: Secondary | ICD-10-CM | POA: Diagnosis not present

## 2018-12-10 DIAGNOSIS — I428 Other cardiomyopathies: Secondary | ICD-10-CM | POA: Diagnosis not present

## 2018-12-10 DIAGNOSIS — Z79899 Other long term (current) drug therapy: Secondary | ICD-10-CM | POA: Insufficient documentation

## 2018-12-10 DIAGNOSIS — D649 Anemia, unspecified: Secondary | ICD-10-CM | POA: Diagnosis not present

## 2018-12-10 DIAGNOSIS — I429 Cardiomyopathy, unspecified: Secondary | ICD-10-CM | POA: Insufficient documentation

## 2018-12-10 DIAGNOSIS — I5022 Chronic systolic (congestive) heart failure: Secondary | ICD-10-CM | POA: Diagnosis not present

## 2018-12-10 DIAGNOSIS — F1010 Alcohol abuse, uncomplicated: Secondary | ICD-10-CM

## 2018-12-10 DIAGNOSIS — I5082 Biventricular heart failure: Secondary | ICD-10-CM | POA: Insufficient documentation

## 2018-12-10 DIAGNOSIS — L732 Hidradenitis suppurativa: Secondary | ICD-10-CM | POA: Insufficient documentation

## 2018-12-10 NOTE — Patient Instructions (Signed)
No blood work done today.   No medication changes were made. Please continue all medications as prescribed.  Your physician recommends that you schedule a follow-up appointment in: 6 months. We will contact you to schedule an appointment.  At the [facility], you and your health needs are our priority. As part of our continuing mission to provide you with exceptional heart care, we have created designated Provider Care Teams. These Care Teams include your primary Cardiologist (physician) and Advanced Practice Providers (APPs- Physician Assistants and Nurse Practitioners) who all work together to provide you with the care you need, when you need it.   You may see any of the following providers on your designated Care Team at your next follow up: Marland Kitchen Dr Glori Bickers . Dr Loralie Champagne . Darrick Grinder, NP . Lyda Jester, PA   Please be sure to bring in all your medications bottles to every appointment.
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
## 2018-12-17 ENCOUNTER — Ambulatory Visit (HOSPITAL_COMMUNITY): Payer: Medicaid - Out of State | Attending: Internal Medicine

## 2018-12-29 ENCOUNTER — Telehealth (HOSPITAL_COMMUNITY): Payer: Self-pay | Admitting: *Deleted

## 2018-12-29 ENCOUNTER — Telehealth (HOSPITAL_COMMUNITY): Payer: Self-pay | Admitting: Vascular Surgery

## 2018-12-29 NOTE — Telephone Encounter (Signed)
LATE ENTRY FROM Friday 10/23: Dr Nevin Bloodgood called to let us know that pt's Hydradrenitis suppurtivae is worse and she is going to start him on TFN-alpha.  As this can affect his HF she wants to make sure he gets an echo and f/u with [place] in next couple of weeks.  Per Dr Haroldine Laws he is ok and agreeable with this plan.  Message sent to schedulers to arrange.

## 2018-12-29 NOTE — Telephone Encounter (Signed)
Left pt message to reschedule echo @ [facility]

## 2018-12-30 NOTE — Telephone Encounter (Signed)
Dr Nevin Bloodgood wants echo and next OV note faxed to her at [phone] when completed, echo sch for 11/3

## 2019-01-04 ENCOUNTER — Telehealth (HOSPITAL_COMMUNITY): Payer: Self-pay

## 2019-01-04 NOTE — Telephone Encounter (Signed)
Left message to return call to see if patient still had insurance. In the system it says he has [place] medicaid but there is no card on file. He does have a [employer]bs card on file but its for vision.

## 2019-01-05 ENCOUNTER — Ambulatory Visit (HOSPITAL_COMMUNITY)
Admission: RE | Admit: 2019-01-05 | Discharge: 2019-01-05 | Disposition: A | Discharge status: Home / Self Care | Payer: BLUE CROSS/BLUE SHIELD | Source: Ambulatory Visit | Attending: Internal Medicine | Admitting: Internal Medicine

## 2019-01-05 ENCOUNTER — Other Ambulatory Visit: Payer: Self-pay

## 2019-01-05 DIAGNOSIS — N189 Chronic kidney disease, unspecified: Secondary | ICD-10-CM | POA: Insufficient documentation

## 2019-01-05 DIAGNOSIS — I5022 Chronic systolic (congestive) heart failure: Secondary | ICD-10-CM | POA: Insufficient documentation

## 2019-01-05 DIAGNOSIS — F17200 Nicotine dependence, unspecified, uncomplicated: Secondary | ICD-10-CM | POA: Diagnosis not present

## 2019-01-05 NOTE — Progress Notes (Signed)
  Echocardiogram 2D Echocardiogram has been performed.  Paul Hahn 01/05/2019, 2:53 PM

## 2019-01-06 ENCOUNTER — Telehealth (HOSPITAL_COMMUNITY): Payer: Self-pay | Admitting: *Deleted

## 2019-01-06 NOTE — Telephone Encounter (Signed)
Faxed echo results to Dr Nevin Bloodgood at [phone].  Attempted to call pt with results and Left message to call back

## 2019-01-11 NOTE — Telephone Encounter (Signed)
Pt aware of echo results 

## 2019-01-13 ENCOUNTER — Telehealth (HOSPITAL_COMMUNITY): Payer: Self-pay

## 2019-01-13 NOTE — Telephone Encounter (Signed)
Received message from patients dermatology office requesting f/u for patient as he is starting a new medicine Humira.  Message sent to scheduler to arrange.

## 2019-02-09 ENCOUNTER — Other Ambulatory Visit (HOSPITAL_COMMUNITY): Payer: Self-pay | Admitting: Internal Medicine

## 2019-02-17 ENCOUNTER — Emergency Department (HOSPITAL_COMMUNITY): Payer: Medicaid - Out of State

## 2019-02-17 ENCOUNTER — Emergency Department (HOSPITAL_COMMUNITY)
Admission: RE | Admit: 2019-02-17 | Payer: Medicaid - Out of State | Source: Ambulatory Visit | Admitting: Internal Medicine

## 2019-02-17 ENCOUNTER — Inpatient Hospital Stay (HOSPITAL_COMMUNITY)
Admission: EM | Admit: 2019-02-17 | Discharge: 2019-02-19 | DRG: 812 | Disposition: A | Discharge status: Home / Self Care | Payer: Medicaid - Out of State | Attending: Family Medicine | Admitting: Family Medicine

## 2019-02-17 ENCOUNTER — Other Ambulatory Visit: Payer: Self-pay

## 2019-02-17 ENCOUNTER — Encounter (HOSPITAL_COMMUNITY): Payer: Self-pay | Admitting: Emergency Medicine

## 2019-02-17 DIAGNOSIS — Z872 Personal history of diseases of the skin and subcutaneous tissue: Secondary | ICD-10-CM

## 2019-02-17 DIAGNOSIS — D509 Iron deficiency anemia, unspecified: Secondary | ICD-10-CM | POA: Diagnosis present

## 2019-02-17 DIAGNOSIS — K449 Diaphragmatic hernia without obstruction or gangrene: Secondary | ICD-10-CM | POA: Diagnosis present

## 2019-02-17 DIAGNOSIS — Z8249 Family history of ischemic heart disease and other diseases of the circulatory system: Secondary | ICD-10-CM

## 2019-02-17 DIAGNOSIS — Z79899 Other long term (current) drug therapy: Secondary | ICD-10-CM

## 2019-02-17 DIAGNOSIS — E8809 Other disorders of plasma-protein metabolism, not elsewhere classified: Secondary | ICD-10-CM | POA: Diagnosis not present

## 2019-02-17 DIAGNOSIS — I429 Cardiomyopathy, unspecified: Secondary | ICD-10-CM | POA: Diagnosis present

## 2019-02-17 DIAGNOSIS — N183 Chronic kidney disease, stage 3 unspecified: Secondary | ICD-10-CM | POA: Diagnosis present

## 2019-02-17 DIAGNOSIS — N179 Acute kidney failure, unspecified: Secondary | ICD-10-CM | POA: Diagnosis present

## 2019-02-17 DIAGNOSIS — D649 Anemia, unspecified: Secondary | ICD-10-CM | POA: Diagnosis not present

## 2019-02-17 DIAGNOSIS — L732 Hidradenitis suppurativa: Secondary | ICD-10-CM | POA: Diagnosis present

## 2019-02-17 DIAGNOSIS — F17200 Nicotine dependence, unspecified, uncomplicated: Secondary | ICD-10-CM | POA: Diagnosis present

## 2019-02-17 DIAGNOSIS — Z720 Tobacco use: Secondary | ICD-10-CM | POA: Diagnosis present

## 2019-02-17 DIAGNOSIS — I5023 Acute on chronic systolic (congestive) heart failure: Secondary | ICD-10-CM

## 2019-02-17 DIAGNOSIS — D539 Nutritional anemia, unspecified: Secondary | ICD-10-CM | POA: Diagnosis present

## 2019-02-17 DIAGNOSIS — J811 Chronic pulmonary edema: Secondary | ICD-10-CM | POA: Diagnosis present

## 2019-02-17 DIAGNOSIS — F1010 Alcohol abuse, uncomplicated: Secondary | ICD-10-CM | POA: Diagnosis present

## 2019-02-17 DIAGNOSIS — K922 Gastrointestinal hemorrhage, unspecified: Secondary | ICD-10-CM | POA: Diagnosis present

## 2019-02-17 DIAGNOSIS — R0602 Shortness of breath: Secondary | ICD-10-CM

## 2019-02-17 DIAGNOSIS — I5022 Chronic systolic (congestive) heart failure: Secondary | ICD-10-CM | POA: Diagnosis present

## 2019-02-17 DIAGNOSIS — Z20828 Contact with and (suspected) exposure to other viral communicable diseases: Secondary | ICD-10-CM | POA: Diagnosis present

## 2019-02-17 LAB — BASIC METABOLIC PANEL
Anion gap: 9 (ref 5–15)
BUN: 24 mg/dL — ABNORMAL HIGH (ref 6–20)
CO2: 22 mmol/L (ref 22–32)
Calcium: 7.9 mg/dL — ABNORMAL LOW (ref 8.9–10.3)
Chloride: 102 mmol/L (ref 98–111)
Creatinine, Ser: 1.67 mg/dL — ABNORMAL HIGH (ref 0.61–1.24)
GFR calc Af Amer: 58 mL/min — ABNORMAL LOW (ref >60)
GFR calc non Af Amer: 50 mL/min — ABNORMAL LOW (ref >60)
Glucose, Bld: 107 mg/dL — ABNORMAL HIGH (ref 70–99)
Potassium: 3.9 mmol/L (ref 3.5–5.1)
Sodium: 133 mmol/L — ABNORMAL LOW (ref 135–145)

## 2019-02-17 LAB — HEMOGLOBIN AND HEMATOCRIT, BLOOD
HCT: 24 % — ABNORMAL LOW (ref 39.0–52.0)
Hemoglobin: 6.7 g/dL — CL (ref 13.0–17.0)

## 2019-02-17 LAB — CBC WITH DIFFERENTIAL/PLATELET
Abs Immature Granulocytes: 0.03 10*3/uL (ref 0.00–0.07)
Basophils Absolute: 0.1 10*3/uL (ref 0.0–0.1)
Basophils Relative: 1 %
Eosinophils Absolute: 0.4 10*3/uL (ref 0.0–0.5)
Eosinophils Relative: 4 %
HCT: 20.5 % — ABNORMAL LOW (ref 39.0–52.0)
Hemoglobin: 5.7 g/dL — CL (ref 13.0–17.0)
Immature Granulocytes: 0 %
Lymphocytes Relative: 24 %
Lymphs Abs: 2.2 10*3/uL (ref 0.7–4.0)
MCH: 22.1 pg — ABNORMAL LOW (ref 26.0–34.0)
MCHC: 27.8 g/dL — ABNORMAL LOW (ref 30.0–36.0)
MCV: 79.5 fL — ABNORMAL LOW (ref 80.0–100.0)
Monocytes Absolute: 0.4 10*3/uL (ref 0.1–1.0)
Monocytes Relative: 4 %
Neutro Abs: 6 10*3/uL (ref 1.7–7.7)
Neutrophils Relative %: 67 %
Platelets: 462 10*3/uL — ABNORMAL HIGH (ref 150–400)
RBC: 2.58 MIL/uL — ABNORMAL LOW (ref 4.22–5.81)
RDW: 21.8 % — ABNORMAL HIGH (ref 11.5–15.5)
WBC: 9 10*3/uL (ref 4.0–10.5)
nRBC: 0 % (ref 0.0–0.2)

## 2019-02-17 LAB — RETICULOCYTES
Immature Retic Fract: 31 % — ABNORMAL HIGH (ref 2.3–15.9)
RBC.: 2.25 MIL/uL — ABNORMAL LOW (ref 4.22–5.81)
Retic Count, Absolute: 86.2 10*3/uL (ref 19.0–186.0)
Retic Ct Pct: 3.8 % — ABNORMAL HIGH (ref 0.4–3.1)

## 2019-02-17 LAB — IRON AND TIBC
Iron: 20 ug/dL — ABNORMAL LOW (ref 45–182)
Saturation Ratios: 10 % — ABNORMAL LOW (ref 17.9–39.5)
TIBC: 194 ug/dL — ABNORMAL LOW (ref 250–450)
UIBC: 174 ug/dL

## 2019-02-17 LAB — FERRITIN: Ferritin: 82 ng/mL (ref 24–336)

## 2019-02-17 LAB — PREPARE RBC (CROSSMATCH)

## 2019-02-17 LAB — BRAIN NATRIURETIC PEPTIDE: B Natriuretic Peptide: 3227 pg/mL — ABNORMAL HIGH (ref 0.0–100.0)

## 2019-02-17 LAB — POC SARS CORONAVIRUS 2 AG -  ED: SARS Coronavirus 2 Ag: NEGATIVE

## 2019-02-17 LAB — VITAMIN B12: Vitamin B-12: 472 pg/mL (ref 180–914)

## 2019-02-17 LAB — FOLATE: Folate: 8.6 ng/mL (ref >5.9)

## 2019-02-17 MED ORDER — ONDANSETRON HCL 4 MG PO TABS: 4.0000 mg | ORAL_TABLET | Freq: Four times a day (QID) | ORAL | Status: DC | PRN

## 2019-02-17 MED ORDER — LORAZEPAM 1 MG PO TABS: 0.0000 mg | ORAL_TABLET | Freq: Two times a day (BID) | ORAL | Status: DC

## 2019-02-17 MED ORDER — SODIUM CHLORIDE 0.9% IV SOLUTION: Freq: Once | INTRAVENOUS | Status: AC

## 2019-02-17 MED ORDER — FUROSEMIDE 10 MG/ML IJ SOLN
40.0000 mg | Freq: Once | INTRAMUSCULAR | Status: AC
  Administered 2019-02-17: 40 mg via INTRAVENOUS
  Filled 2019-02-17: qty 4

## 2019-02-17 MED ORDER — ACETAMINOPHEN 650 MG RE SUPP: 650.0000 mg | Freq: Four times a day (QID) | RECTAL | Status: DC | PRN

## 2019-02-17 MED ORDER — THIAMINE HCL 100 MG PO TABS
100.0000 mg | ORAL_TABLET | Freq: Every day | ORAL | Status: DC
  Administered 2019-02-17 – 2019-02-19 (×2): 100 mg via ORAL
  Filled 2019-02-17 (×3): qty 1

## 2019-02-17 MED ORDER — CARVEDILOL 3.125 MG PO TABS
18.7500 mg | ORAL_TABLET | Freq: Two times a day (BID) | ORAL | Status: DC
  Administered 2019-02-17 – 2019-02-19 (×4): 18.75 mg via ORAL
  Filled 2019-02-17 (×4): qty 2

## 2019-02-17 MED ORDER — ADULT MULTIVITAMIN W/MINERALS CH
1.0000 | ORAL_TABLET | Freq: Every day | ORAL | Status: DC
  Administered 2019-02-17 – 2019-02-19 (×2): 1 via ORAL
  Filled 2019-02-17 (×3): qty 1

## 2019-02-17 MED ORDER — FUROSEMIDE 10 MG/ML IJ SOLN
40.0000 mg | Freq: Once | INTRAMUSCULAR | Status: AC
  Administered 2019-02-18: 40 mg via INTRAVENOUS
  Filled 2019-02-17: qty 4

## 2019-02-17 MED ORDER — LORAZEPAM 1 MG PO TABS: 0.0000 mg | ORAL_TABLET | Freq: Four times a day (QID) | ORAL | Status: AC

## 2019-02-17 MED ORDER — ALBUTEROL SULFATE (2.5 MG/3ML) 0.083% IN NEBU: 2.5000 mg | INHALATION_SOLUTION | RESPIRATORY_TRACT | Status: DC | PRN

## 2019-02-17 MED ORDER — SACUBITRIL-VALSARTAN 97-103 MG PO TABS
1.0000 | ORAL_TABLET | Freq: Two times a day (BID) | ORAL | Status: DC
  Administered 2019-02-18 – 2019-02-19 (×4): 1 via ORAL
  Filled 2019-02-17 (×10): qty 1

## 2019-02-17 MED ORDER — ONDANSETRON HCL 4 MG/2ML IJ SOLN: 4.0000 mg | Freq: Four times a day (QID) | INTRAMUSCULAR | Status: DC | PRN

## 2019-02-17 MED ORDER — PEG 3350-KCL-NA BICARB-NACL 420 G PO SOLR
4000.0000 mL | Freq: Once | ORAL | Status: AC
  Administered 2019-02-18: 4000 mL via ORAL

## 2019-02-17 MED ORDER — POLYETHYLENE GLYCOL 3350 17 G PO PACK: 17.0000 g | PACK | Freq: Every day | ORAL | Status: DC | PRN

## 2019-02-17 MED ORDER — SENNOSIDES-DOCUSATE SODIUM 8.6-50 MG PO TABS
2.0000 | ORAL_TABLET | Freq: Two times a day (BID) | ORAL | Status: DC
  Administered 2019-02-17 – 2019-02-19 (×4): 2 via ORAL
  Filled 2019-02-17 (×4): qty 2

## 2019-02-17 MED ORDER — ADULT MULTIVITAMIN W/MINERALS CH
1.0000 | ORAL_TABLET | Freq: Every day | ORAL | Status: DC
  Administered 2019-02-17: 1 via ORAL
  Filled 2019-02-17: qty 1

## 2019-02-17 MED ORDER — ACETAMINOPHEN 325 MG PO TABS: 650.0000 mg | ORAL_TABLET | Freq: Four times a day (QID) | ORAL | Status: DC | PRN

## 2019-02-17 MED ORDER — TRAZODONE HCL 50 MG PO TABS: 50.0000 mg | ORAL_TABLET | Freq: Every evening | ORAL | Status: DC | PRN

## 2019-02-17 MED ORDER — OXYCODONE HCL 5 MG PO TABS
5.0000 mg | ORAL_TABLET | ORAL | Status: DC | PRN
  Administered 2019-02-17 (×2): 5 mg via ORAL
  Filled 2019-02-17 (×2): qty 1

## 2019-02-17 MED ORDER — SPIRONOLACTONE 25 MG PO TABS
25.0000 mg | ORAL_TABLET | Freq: Every day | ORAL | Status: DC
  Administered 2019-02-17 – 2019-02-19 (×3): 25 mg via ORAL
  Filled 2019-02-17 (×3): qty 1

## 2019-02-17 MED ORDER — PANTOPRAZOLE SODIUM 40 MG IV SOLR
40.0000 mg | Freq: Two times a day (BID) | INTRAVENOUS | Status: DC
  Administered 2019-02-17 – 2019-02-19 (×5): 40 mg via INTRAVENOUS
  Filled 2019-02-17 (×5): qty 40

## 2019-02-17 MED ORDER — SODIUM CHLORIDE 0.9% FLUSH
3.0000 mL | Freq: Two times a day (BID) | INTRAVENOUS | Status: DC
  Administered 2019-02-17 – 2019-02-18 (×3): 3 mL via INTRAVENOUS

## 2019-02-17 MED ORDER — FUROSEMIDE 10 MG/ML IJ SOLN
40.0000 mg | Freq: Every day | INTRAMUSCULAR | Status: DC
  Administered 2019-02-18 – 2019-02-19 (×2): 40 mg via INTRAVENOUS
  Filled 2019-02-17 (×2): qty 4

## 2019-02-17 MED ORDER — LORAZEPAM 1 MG PO TABS: 1.0000 mg | ORAL_TABLET | ORAL | Status: DC | PRN

## 2019-02-17 MED ORDER — DOXYCYCLINE HYCLATE 100 MG PO CAPS: 100.0000 mg | ORAL_CAPSULE | Freq: Two times a day (BID) | ORAL | 0 refills | Status: DC

## 2019-02-17 MED ORDER — SODIUM CHLORIDE 0.9% FLUSH: 3.0000 mL | INTRAVENOUS | Status: DC | PRN

## 2019-02-17 MED ORDER — BISACODYL 5 MG PO TBEC
10.0000 mg | DELAYED_RELEASE_TABLET | Freq: Once | ORAL | Status: AC
  Administered 2019-02-17: 10 mg via ORAL
  Filled 2019-02-17: qty 2

## 2019-02-17 MED ORDER — THIAMINE HCL 100 MG/ML IJ SOLN: 100.0000 mg | Freq: Every day | INTRAMUSCULAR | Status: DC

## 2019-02-17 MED ORDER — FERROUS SULFATE 325 (65 FE) MG PO TBEC: 325.0000 mg | DELAYED_RELEASE_TABLET | Freq: Two times a day (BID) | ORAL | Status: DC

## 2019-02-17 MED ORDER — SODIUM CHLORIDE 0.9 % IV SOLN: 250.0000 mL | INTRAVENOUS | Status: DC | PRN

## 2019-02-17 MED ORDER — FUROSEMIDE 40 MG PO TABS
40.0000 mg | ORAL_TABLET | Freq: Once | ORAL | Status: AC
  Administered 2019-02-17: 40 mg via ORAL
  Filled 2019-02-17: qty 1

## 2019-02-17 MED ORDER — LORAZEPAM 2 MG/ML IJ SOLN: 1.0000 mg | INTRAMUSCULAR | Status: DC | PRN

## 2019-02-17 MED ORDER — NICOTINE 14 MG/24HR TD PT24
14.0000 mg | MEDICATED_PATCH | Freq: Every day | TRANSDERMAL | Status: DC
  Administered 2019-02-17 – 2019-02-19 (×3): 14 mg via TRANSDERMAL
  Filled 2019-02-17 (×3): qty 1

## 2019-02-17 MED ORDER — FOLIC ACID 1 MG PO TABS
1.0000 mg | ORAL_TABLET | Freq: Every day | ORAL | Status: DC
  Administered 2019-02-17 – 2019-02-19 (×2): 1 mg via ORAL
  Filled 2019-02-17 (×3): qty 1
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
## 2019-02-17 NOTE — ED Notes (Signed)
Pt refused stool sample collection

## 2019-02-17 NOTE — ED Provider Notes (Signed)
I have personally evaluated this patient at the bedside after change of shift.  He is a 43 year old male with a known history of congestive heart failure as well as a history of anemia who has had some increasing shortness of breath, he feels orthopneic, he has had the occasional cough.  His exam is significant for a borderline tachycardia, there is no significant edema of his legs, he does not appear in respiratory distress but occasionally mildly tachypneic.  X-ray shows cardiomegaly which was increased from a prior exam and some mild pulmonary edema.  His laboratory work-up is significant for a significant anemia with a hemoglobin of 5.7.  He reports having hemoglobins in the 8 range was referred to colonoscopy because of an unknown source of anemia but has not yet had this.  He is from [place] and was supposed to go to a gastroenterologist up there.  This has not yet occurred.  He has been on iron supplements but has not had them in a week and a half, states that after taking iron his stool changed from a normal color to a blackish green.  He denies seeing any blood, denies any other bleeding.  I have looked at his EKG, there is no significant changes from prior, there is T wave inversions in multiple leads unchanged.  Will likely need to be admitted for blood transfusion, ongoing diuresis  The patient refused rectal exam, I have started the blood transfusion in the emergency department and discussed the case with Dr. Denton Brick who will admit  .Critical Care Performed by: Noemi Chapel, MD Authorized by: Noemi Chapel, MD   Critical care provider statement:    Critical care time (minutes):  35   Critical care time was exclusive of:  Separately billable procedures and treating other patients   Critical care was necessary to treat or prevent imminent or life-threatening deterioration of the following conditions: severe anemia.   Critical care was time spent personally by me on the following  activities:  Discussions with consultants, evaluation of patient's response to treatment, examination of patient, ordering and performing treatments and interventions, ordering and review of laboratory studies, ordering and review of radiographic studies, pulse oximetry, re-evaluation of patient's condition, obtaining history from patient or surrogate and review of old charts Comments:         Final diagnoses:  Shortness of breath  Acute on chronic systolic congestive heart failure ([place])  Severe anemia      Noemi Chapel, MD 02/17/19 [phone]

## 2019-02-17 NOTE — ED Notes (Signed)
Date and time results received: 02/17/19 7:12 AM  (use smartphrase ".now" to insert current time)  Test: HGB Critical Value: 5.7  Name of Provider Notified: Dr Sabra Heck  Orders Received? Or Actions Taken?:

## 2019-02-17 NOTE — ED Triage Notes (Signed)
Pt C/O Sob that began 4 days ago. Pt states he went to a gathering where "a couple people tested positive for COVID 19." Pt ambulatory to treatment room O2 99% on RA.
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
## 2019-02-17 NOTE — Plan of Care (Signed)
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
## 2019-02-18 DIAGNOSIS — K922 Gastrointestinal hemorrhage, unspecified: Secondary | ICD-10-CM | POA: Diagnosis present

## 2019-02-18 DIAGNOSIS — Z8249 Family history of ischemic heart disease and other diseases of the circulatory system: Secondary | ICD-10-CM | POA: Diagnosis not present

## 2019-02-18 DIAGNOSIS — N179 Acute kidney failure, unspecified: Secondary | ICD-10-CM | POA: Diagnosis present

## 2019-02-18 DIAGNOSIS — D509 Iron deficiency anemia, unspecified: Secondary | ICD-10-CM | POA: Diagnosis present

## 2019-02-18 DIAGNOSIS — L732 Hidradenitis suppurativa: Secondary | ICD-10-CM | POA: Diagnosis present

## 2019-02-18 DIAGNOSIS — F1010 Alcohol abuse, uncomplicated: Secondary | ICD-10-CM | POA: Diagnosis present

## 2019-02-18 DIAGNOSIS — D539 Nutritional anemia, unspecified: Secondary | ICD-10-CM | POA: Diagnosis present

## 2019-02-18 DIAGNOSIS — E8809 Other disorders of plasma-protein metabolism, not elsewhere classified: Secondary | ICD-10-CM | POA: Diagnosis not present

## 2019-02-18 DIAGNOSIS — I5022 Chronic systolic (congestive) heart failure: Secondary | ICD-10-CM | POA: Diagnosis present

## 2019-02-18 DIAGNOSIS — K228 Other specified diseases of esophagus: Secondary | ICD-10-CM | POA: Diagnosis not present

## 2019-02-18 DIAGNOSIS — D649 Anemia, unspecified: Secondary | ICD-10-CM | POA: Diagnosis not present

## 2019-02-18 DIAGNOSIS — I429 Cardiomyopathy, unspecified: Secondary | ICD-10-CM | POA: Diagnosis present

## 2019-02-18 DIAGNOSIS — F17200 Nicotine dependence, unspecified, uncomplicated: Secondary | ICD-10-CM | POA: Diagnosis present

## 2019-02-18 DIAGNOSIS — N183 Chronic kidney disease, stage 3 unspecified: Secondary | ICD-10-CM | POA: Diagnosis present

## 2019-02-18 DIAGNOSIS — J811 Chronic pulmonary edema: Secondary | ICD-10-CM | POA: Diagnosis present

## 2019-02-18 DIAGNOSIS — K449 Diaphragmatic hernia without obstruction or gangrene: Secondary | ICD-10-CM | POA: Diagnosis not present

## 2019-02-18 DIAGNOSIS — Z79899 Other long term (current) drug therapy: Secondary | ICD-10-CM | POA: Diagnosis not present

## 2019-02-18 DIAGNOSIS — Z20828 Contact with and (suspected) exposure to other viral communicable diseases: Secondary | ICD-10-CM | POA: Diagnosis present

## 2019-02-18 LAB — CBC
HCT: 24.8 % — ABNORMAL LOW (ref 39.0–52.0)
Hemoglobin: 7.4 g/dL — ABNORMAL LOW (ref 13.0–17.0)
MCH: 23.9 pg — ABNORMAL LOW (ref 26.0–34.0)
MCHC: 29.8 g/dL — ABNORMAL LOW (ref 30.0–36.0)
MCV: 80 fL (ref 80.0–100.0)
Platelets: 447 10*3/uL — ABNORMAL HIGH (ref 150–400)
RBC: 3.1 MIL/uL — ABNORMAL LOW (ref 4.22–5.81)
RDW: 21.2 % — ABNORMAL HIGH (ref 11.5–15.5)
WBC: 10.9 10*3/uL — ABNORMAL HIGH (ref 4.0–10.5)
nRBC: 0 % (ref 0.0–0.2)

## 2019-02-18 LAB — NOVEL CORONAVIRUS, NAA (HOSP ORDER, SEND-OUT TO REF LAB; TAT 18-24 HRS): SARS-CoV-2, NAA: NOT DETECTED

## 2019-02-18 LAB — PREPARE RBC (CROSSMATCH)

## 2019-02-18 LAB — COMPREHENSIVE METABOLIC PANEL
ALT: 17 U/L (ref 0–44)
AST: 27 U/L (ref 15–41)
Albumin: 2.3 g/dL — ABNORMAL LOW (ref 3.5–5.0)
Alkaline Phosphatase: 127 U/L — ABNORMAL HIGH (ref 38–126)
Anion gap: 10 (ref 5–15)
BUN: 21 mg/dL — ABNORMAL HIGH (ref 6–20)
CO2: 22 mmol/L (ref 22–32)
Calcium: 8.1 mg/dL — ABNORMAL LOW (ref 8.9–10.3)
Chloride: 99 mmol/L (ref 98–111)
Creatinine, Ser: 1.56 mg/dL — ABNORMAL HIGH (ref 0.61–1.24)
GFR calc Af Amer: >60 mL/min (ref >60)
GFR calc non Af Amer: 54 mL/min — ABNORMAL LOW (ref >60)
Glucose, Bld: 86 mg/dL (ref 70–99)
Potassium: 4.1 mmol/L (ref 3.5–5.1)
Sodium: 131 mmol/L — ABNORMAL LOW (ref 135–145)
Total Bilirubin: 1 mg/dL (ref 0.3–1.2)
Total Protein: 10.2 g/dL — ABNORMAL HIGH (ref 6.5–8.1)

## 2019-02-18 LAB — HIV ANTIBODY (ROUTINE TESTING W REFLEX): HIV Screen 4th Generation wRfx: NONREACTIVE

## 2019-02-18 MED ORDER — FUROSEMIDE 10 MG/ML IJ SOLN
40.0000 mg | Freq: Once | INTRAMUSCULAR | Status: AC
  Administered 2019-02-18: 40 mg via INTRAVENOUS
  Filled 2019-02-18: qty 4

## 2019-02-18 MED ORDER — SODIUM CHLORIDE 0.9% IV SOLUTION: Freq: Once | INTRAVENOUS | Status: AC

## 2019-02-18 MED ORDER — ONDANSETRON HCL 4 MG/2ML IJ SOLN
4.0000 mg | Freq: Once | INTRAMUSCULAR | Status: AC
  Administered 2019-02-18: 4 mg via INTRAVENOUS
  Filled 2019-02-18: qty 2

## 2019-02-18 MED ORDER — SODIUM CHLORIDE 0.9 % IV SOLN: INTRAVENOUS | Status: DC

## 2019-02-18 NOTE — Progress Notes (Signed)
Patient Demographics:    Paul Hahn, is a 43 y.o. male, DOB - 1975/12/21, PNT:[id number]  Admit date - 02/17/2019   Admitting Physician Paul Anastos [name], MD  Outpatient Primary MD for the patient is [name], [name], MD  LOS - 0   Chief Complaint  Patient presents with  . Shortness of Breath        Subjective:    Paul Hahn today has no fevers, no emesis,  No chest pain,   -Complains of being hungry after completing colonoscopy GoLYTELY prep -Very unhappy about not being fed -Even more unhappy that endoluminal evaluation has been postponed by anesthesiologist until 02/19/2019  Assessment  & Plan :    Principal Problem:   Symptomatic anemia Active Problems:   Chronic systolic heart failure (HCC)   CKD (chronic kidney disease) stage 3, GFR 30-59 ml/min   Hx of hidradenitis suppurativa   Alcohol abuse   Tobacco abuse   Iron deficiency anemia  Brief Summary:- 43 y.o. male  with a past medical historysignificant for severe hidradenitis suppurativa,iron deficiency anemia andCHFwith LVEF 35 to 40%, cardiogenic shock with LV thrombus 05/2016, alcohol abuse and tobacco use  admitted on 02/17/2019 with worsening shortness of breath and dyspnea on exertion and found to have hemoglobin of 5.7   A/p  1) acute on chronic symptomatic iron deficiency anemia--- GI consult appreciated -Hemoglobin up to 7.4 from 5.7 after transfusion of 2 units of packed cells -Completed colonoscopy GoLYTELY prep -Patient is eager to have EGD/colonoscopy -Protonix 40 mg iv. twice daily -Patient apparently has never had endoluminal evaluation -Would benefit from EGD and colonoscopy as part of work-up for his severe symptomatic anemia -Anemia work-up noted B12-is normal at 472, folate-is normal at 8.6, ferritin normal at 82 and iron studies reveals serum iron low at 20, TIBC low at 194 and iron  saturation is low at 10 -Reticulocyte count is 3.8% with absolute reticulocyte count of 86.2 -Patient completed this colonoscopy prep -I personally had a face-to-face conversation with anesthesiologist Dr. Lemont Fillers in the a.m. of 02/18/2019, and explained that patient was medically stable, hemoglobin was up to 7.4 from 5.7 on admission, requested that we proceed with endoluminal evaluation by GI service under anesthesia,  however later in the day Dr. Lemont Fillers said he did not feel comfortable proceeding with EGD and colonoscopy under anesthesia today -I had another conversation with Dr. Eli Hose explained that patient was medically stable, hemoglobin was up to 7.4 from 5.7 on admission -Dyspnea has improved significantly with diuresis and transfusion, patient with no requiring any oxygen - As per Dr. Lemont Fillers he does not feel comfortable proceeding with endoluminal evaluation under anesthesia  Until 02/19/2019 -patient will receive additional units of packed on 02/18/19 -Hopefully Dr Lemont Fillers (anesthesiologist ) will feel more comfortable proceeding on 02/19/2019 after reevaluating patient in a.m.  2)HFrEF--- patient with chronic systolic dysfunction CHF, last known EF 35 to 40% based on echo from 01/05/2019 --- continue Entresto, Coreg, Aldactone,  Lasix as ordered -Daily weights, fluid input and output charting -Prior h/o cardiogenic shock with LV thrombus 05/2016, -Clinically does not appear decompensated at this time -Severe anemia probably causing some degree of high-output heart failure compounding patient's already known chronic systolic dysfunction CHF --give additional doses of IV Lasix  with transfusion of PRBCs  3) alcohol abuse--lorazepam per CIWA protocol multivitamin as ordered -No frank DTs at this time  4) tobacco abuse--- smoking cessation advised use nicotine patch  5)Severe hidradenitis suppurativa=----- patient sees a  dermatologist  6)AKI----acute kidney injury  -      creatinine on admission= 1.67 ,   baseline creatinine = 1.1   , creatinine is now=1.56 , renally adjust medications, avoid nephrotoxic agents / dehydration /hypotension -Watch renal function closely with Aldactone, Entresto and Lasix use  Disposition/Need for in-Hospital Stay- patient unable to be discharged at this time due to --- patient with severe symptomatic anemia awaiting endoluminal evaluation  Code Status : Full  Family Communication:   NA (patient is alert, awake and coherent)   Disposition Plan  : home   Consults  : GI/anesthesia  DVT Prophylaxis  :   SCDs   Lab Results  Component Value Date   PLT 447 (H) 02/18/2019   Inpatient Medications  Scheduled Meds: . sodium chloride   Intravenous Once  . carvedilol  18.75 mg Oral BID WC  . folic acid  1 mg Oral Daily  . furosemide  40 mg Intravenous Daily  . furosemide  40 mg Intravenous Once  . LORazepam  0-4 mg Oral Q6H   Followed by  . [START ON 02/19/2019] LORazepam  0-4 mg Oral Q12H  . multivitamin with minerals  1 tablet Oral Daily  . multivitamin with minerals  1 tablet Oral Daily  . nicotine  14 mg Transdermal Daily  . pantoprazole (PROTONIX) IV  40 mg Intravenous Q12H  . sacubitril-valsartan  1 tablet Oral BID  . senna-docusate  2 tablet Oral BID  . sodium chloride flush  3 mL Intravenous Q12H  . spironolactone  25 mg Oral Daily  . thiamine  100 mg Oral Daily   Or  . thiamine  100 mg Intravenous Daily   Continuous Infusions: . sodium chloride    . sodium chloride 20 mL/hr at 02/18/19 1600   PRN Meds:.sodium chloride, acetaminophen **OR** acetaminophen, albuterol, LORazepam **OR** LORazepam, ondansetron **OR** ondansetron (ZOFRAN) IV, oxyCODONE, polyethylene glycol, sodium chloride flush, traZODone   Anti-infectives (From admission, onward)   Start     Dose/Rate Route Frequency Ordered Stop   02/17/19 0000  doxycycline (VIBRAMYCIN) 100 MG capsule  Status:  Discontinued     100 mg Oral 2 times daily  02/17/19 0701 02/18/19        Objective:   Vitals:   02/18/19 0100 02/18/19 0600 02/18/19 1100 02/18/19 1510  BP: (!) 122/91 (!) 129/93 (!) 129/93 125/87  Pulse: 99 96 96 89  Resp: 16 18  20   Temp: 98.7 F (37.1 C) 98.5 F (36.9 C)  97.8 F (36.6 C)  TempSrc: Oral Oral  Oral  SpO2: 98% 97%  94%  Weight:      Height:        Wt Readings from Last 3 Encounters:  02/17/19 72.4 kg  12/10/18 67.8 kg  11/20/17 70.9 kg    Intake/Output Summary (Last 24 hours) at 02/18/2019 1716 Last data filed at 02/18/2019 1600 Gross per 24 hour  Intake 199.33 ml  Output 1000 ml  Net -800.67 ml   Physical Exam  Gen:- Awake Alert,  In no apparent distress  HEENT:- .AT, No sclera icterus Neck-Supple Neck,No JVD,.  Lungs-improved air movement, no wheezing CV- S1, S2 normal, regular  Abd-  +ve B.Sounds, Abd Soft, No tenderness,    Extremity- No  edema, pedal pulses present  Psych-affect is appropriate, oriented x3 Neuro-no new focal deficits, no tremors Skin- extensive hidradenitis suppurativa lesions especially on the face and trunk--some of the lesions with drainage, no evidence of secondary cellulitis   Data Review:   Micro Results Recent Results (from the past 240 hour(s))  Novel Coronavirus, NAA (Hosp order, Send-out to Ref Lab; TAT 18-24 hrs     Status: None   Collection Time: 02/17/19  8:46 AM   Specimen: Nasopharyngeal Swab; Respiratory  Result Value Ref Range Status   SARS-CoV-2, NAA NOT DETECTED NOT DETECTED Final    Comment: (NOTE) This nucleic acid amplification test was developed and its performance characteristics determined by [employer]. Nucleic acid amplification tests include PCR and TMA. This test has not been FDA cleared or approved. This test has been authorized by FDA under an Emergency Use Authorization (EUA). This test is only authorized for the duration of time the declaration that circumstances exist justifying the authorization of the emergency  use of in vitro diagnostic tests for detection of SARS-CoV-2 virus and/or diagnosis of COVID-19 infection under section 564(b)(1) of the Act, 21 U.S.C. 060OKH-9(X) (1), unless the authorization is terminated or revoked sooner. When diagnostic testing is negative, the possibility of a false negative result should be considered in the context of a patient's recent exposures and the presence of clinical signs and symptoms consistent with COVID-19. An individual without symptoms of COVID- 19 and who is not shedding SARS-CoV-2 vi rus would expect to have a negative (not detected) result in this assay. Performed At: [facility] [address] [place], [place] [id number] Maurine Simmering MDPhD [id number]    Coronavirus Source NASOPHARYNGEAL  Final    Comment: Performed at [facility], [address] [place], [place] [address]    Radiology Reports DG Chest [place] 1 View  Result Date: 02/17/2019 CLINICAL DATA:  Shortness of breath for 4 days. EXAM: PORTABLE CHEST 1 VIEW COMPARISON:  Radiograph 07/25/2017 FINDINGS: Cardiomegaly, progressed from prior exam. Vascular congestion. Mild pulmonary edema with Kerley B-lines. No definite pleural fluid. Streaky right perihilar opacities. No pneumothorax. No acute osseous abnormalities are seen. IMPRESSION: 1. Cardiomegaly, increased from prior exam. Vascular congestion with mild pulmonary edema. 2. Streaky right perihilar opacities favor atelectasis, pneumonia not entirely excluded. Electronically Signed   By: Narda Rutherford M.D.   On: 02/17/2019 06:56     CBC Recent Labs  Lab 02/17/19 0637 02/17/19 1909 02/18/19 0423  WBC 9.0  --  10.9*  HGB 5.7* 6.7* 7.4*  HCT 20.5* 24.0* 24.8*  PLT 462*  --  447*  MCV 79.5*  --  80.0  MCH 22.1*  --  23.9*  MCHC 27.8*  --  29.8*  RDW 21.8*  --  21.2*  LYMPHSABS 2.2  --   --   MONOABS 0.4  --   --   EOSABS 0.4  --   --   BASOSABS 0.1  --   --     Chemistries  Recent Labs  Lab 02/17/19 0637  02/18/19 0423  NA 133* 131*  K 3.9 4.1  CL 102 99  CO2 22 22  GLUCOSE 107* 86  BUN 24* 21*  CREATININE 1.67* 1.56*  CALCIUM 7.9* 8.1*  AST  --  27  ALT  --  17  ALKPHOS  --  127*  BILITOT  --  1.0   ------------------------------------------------------------------------------------------------------------------ No results for input(s): CHOL, HDL, LDLCALC, TRIG, CHOLHDL, LDLDIRECT in the last 72 hours.  No results found for: HGBA1C ------------------------------------------------------------------------------------------------------------------ No  results for input(s): TSH, T4TOTAL, T3FREE, THYROIDAB in the last 72 hours.  Invalid input(s): FREET3 ------------------------------------------------------------------------------------------------------------------ Recent Labs    02/17/19 0832  VITAMINB12 472  FOLATE 8.6  FERRITIN 82  TIBC 194*  IRON 20*  RETICCTPCT 3.8*    Coagulation profile No results for input(s): INR, PROTIME in the last 168 hours.  No results for input(s): DDIMER in the last 72 hours.  Cardiac Enzymes No results for input(s): CKMB, TROPONINI, MYOGLOBIN in the last 168 hours.  Invalid input(s): CK ------------------------------------------------------------------------------------------------------------------    Component Value Date/Time   BNP 3,227.0 (H) 02/17/2019 0973     Roxan Hockey M.D on 02/18/2019 at 5:16 PM  Go to [email or web address] - for contact info  [facility] - Office  [phone]

## 2019-02-18 NOTE — Progress Notes (Signed)
Subjective:  Patient is complaining that we are starving him.  He did finish almost all of GoLYTELY.  He states he is passing greenish-yellowish watery stool.  He is not having epigastric pain today.  He denies dyspnea at rest.  He also denies chest pain.  Objective: Blood pressure (!) 129/93, pulse 96, temperature 98.5 F (36.9 C), temperature source Oral, resp. rate 18, height 6' (1.829 m), weight 72.4 kg, SpO2 97 %. Patient is alert and in no acute distress. He is not tachypneic. He has extensive changes of separate cover involving his face and upper neck. Cardiac exam with regular rhythm normal S1 and S2.  No murmur gallop noted. Auscultation lungs reveal vesicular breath sounds bilaterally. Abdomen is soft and nontender.  No organomegaly or masses. He does not have peripheral edema. He does have clubbing. Has extensive tattoos involving his chest and upper extremities.    Labs/studies Results:  CBC Latest Ref Rng & Units 02/18/2019 02/17/2019 02/17/2019  WBC 4.0 - 10.5 K/uL 10.9(H) - 9.0  Hemoglobin 13.0 - 17.0 g/dL 7.4(L) 6.7(LL) 5.7(LL)  Hematocrit 39.0 - 52.0 % 24.8(L) 24.0(L) 20.5(L)  Platelets 150 - 400 K/uL 447(H) - 462(H)    CMP Latest Ref Rng & Units 02/18/2019 02/17/2019 08/14/2017  Glucose 70 - 99 mg/dL 86 107(H) 82  BUN 6 - 20 mg/dL 21(H) 24(H) 11  Creatinine 0.61 - 1.24 mg/dL 1.56(H) 1.67(H) 1.14  Sodium 135 - 145 mmol/L 131(L) 133(L) 136  Potassium 3.5 - 5.1 mmol/L 4.1 3.9 3.8  Chloride 98 - 111 mmol/L 99 102 102  CO2 22 - 32 mmol/L 22 22 25  Calcium 8.9 - 10.3 mg/dL 8.1(L) 7.9(L) 8.8(L)  Total Protein 6.5 - 8.1 g/dL 10.2(H) - -  Total Bilirubin 0.3 - 1.2 mg/dL 1.0 - -  Alkaline Phos 38 - 126 U/L 127(H) - -  AST 15 - 41 U/L 27 - -  ALT 0 - 44 U/L 17 - -    Hepatic Function Latest Ref Rng & Units 02/18/2019 07/25/2017 04/23/2017  Total Protein 6.5 - 8.1 g/dL 10.2(H) 9.9(H) 9.2(H)  Albumin 3.5 - 5.0 g/dL 2.3(L) 2.4(L) 2.3(L)  AST 15 - 41 U/L 27 18 21  ALT  0 - 44 U/L 17 12(L) 17  Alk Phosphatase 38 - 126 U/L 127(H) 63 126  Total Bilirubin 0.3 - 1.2 mg/dL 1.0 0.4 0.6  Bilirubin, Direct 0.1 - 0.5 mg/dL - - -      Assessment:  #1.  Acute on chronic anemia.  Patient has well-documented iron deficiency anemia but has not been evaluated.  He was scheduled for outpatient colonoscopy by his physician next month but he ended up here with hemoglobin of 5.7 g.  Patient has received 2 units of PRBCs.  Hemoglobin is up to 7.4 g. He was scheduled for EGD and colonoscopy today.  It was postponed until 02/19/2019 as Dr. Nabonsal of anesthesia feels he is not fully tuned up.  #2.  Cardiomyopathy.  Chest film on admission suggested CHF.  He had echo 6 weeks ago revealing EF of 35 to 40%.  Patient does not have dyspnea at rest.  #3.  Extensive hidradenitis suppurativa.  #4.  History of alcohol abuse.  Patient is not manifesting any signs of alcohol withdrawal.  Patient's transaminases are normal.  Serum albumin is quite low and it may be nutritional and not due to chronic liver disease.  Recommendations  Discussed with Dr. Emokpae who does not feel that he needs another unit of PRBCs. Patient   will continue clear liquids today.  We will let him have 2 cans of boost. He will undergo esophagogastroduodenoscopy and colonoscopy under monitored anesthesia care on 02/19/2019.     

## 2019-02-18 NOTE — TOC Transition Note (Signed)
Transition of Care [facility]) - CM/SW Discharge Note   Patient Details  Name: Michiah Mudry MRN: [id number] Date of Birth: October 14, 1975  [facility] CM/SW Contact:  Harrell Niehoff, Chauncey Reading, RN Phone Number: 02/18/2019, 2:55 PM   Clinical Narrative:   CM consulted for ETOH resources. Discussed with patient, he declines resources at this time. CM will sign off.       Barriers to Discharge: Continued Medical Work up    Discharge Plan and Services   Discharge Planning Services: CM Consult                        Social Determinants of Health (SDOH) Interventions     Readmission Risk Interventions No flowsheet data found.

## 2019-02-19 ENCOUNTER — Inpatient Hospital Stay (HOSPITAL_COMMUNITY): Payer: Medicaid - Out of State | Admitting: Anesthesiology

## 2019-02-19 ENCOUNTER — Encounter (HOSPITAL_COMMUNITY): Payer: Self-pay | Admitting: Family Medicine

## 2019-02-19 ENCOUNTER — Encounter (HOSPITAL_COMMUNITY)
Admission: EM | Disposition: A | Discharge status: Home / Self Care | Payer: Self-pay | Source: Home / Self Care | Attending: Family Medicine

## 2019-02-19 DIAGNOSIS — K228 Other specified diseases of esophagus: Secondary | ICD-10-CM

## 2019-02-19 DIAGNOSIS — K449 Diaphragmatic hernia without obstruction or gangrene: Secondary | ICD-10-CM

## 2019-02-19 HISTORY — PX: BIOPSY: SHX5522

## 2019-02-19 HISTORY — PX: COLONOSCOPY WITH PROPOFOL: SHX5780

## 2019-02-19 HISTORY — PX: ESOPHAGOGASTRODUODENOSCOPY (EGD) WITH PROPOFOL: SHX5813

## 2019-02-19 LAB — TYPE AND SCREEN
ABO/RH(D): O POS
Antibody Screen: NEGATIVE
Unit division: 0
Unit division: 0
Unit division: 0

## 2019-02-19 LAB — RENAL FUNCTION PANEL
Albumin: 2.3 g/dL — ABNORMAL LOW (ref 3.5–5.0)
Anion gap: 9 (ref 5–15)
BUN: 23 mg/dL — ABNORMAL HIGH (ref 6–20)
CO2: 23 mmol/L (ref 22–32)
Calcium: 8.2 mg/dL — ABNORMAL LOW (ref 8.9–10.3)
Chloride: 100 mmol/L (ref 98–111)
Creatinine, Ser: 1.76 mg/dL — ABNORMAL HIGH (ref 0.61–1.24)
GFR calc Af Amer: 54 mL/min — ABNORMAL LOW (ref >60)
GFR calc non Af Amer: 47 mL/min — ABNORMAL LOW (ref >60)
Glucose, Bld: 88 mg/dL (ref 70–99)
Phosphorus: 3.2 mg/dL (ref 2.5–4.6)
Potassium: 4.1 mmol/L (ref 3.5–5.1)
Sodium: 132 mmol/L — ABNORMAL LOW (ref 135–145)

## 2019-02-19 LAB — BPAM RBC
Blood Product Expiration Date: 202012262359
Blood Product Expiration Date: 202012282359
Blood Product Expiration Date: 202101122359
ISSUE DATE / TIME: 202012161038
ISSUE DATE / TIME: 202012162148
ISSUE DATE / TIME: 202012172027
Unit Type and Rh: 5100
Unit Type and Rh: 9500
Unit Type and Rh: 9500

## 2019-02-19 LAB — CBC
HCT: 27.1 % — ABNORMAL LOW (ref 39.0–52.0)
Hemoglobin: 8.3 g/dL — ABNORMAL LOW (ref 13.0–17.0)
MCH: 24.9 pg — ABNORMAL LOW (ref 26.0–34.0)
MCHC: 30.6 g/dL (ref 30.0–36.0)
MCV: 81.1 fL (ref 80.0–100.0)
Platelets: 425 10*3/uL — ABNORMAL HIGH (ref 150–400)
RBC: 3.34 MIL/uL — ABNORMAL LOW (ref 4.22–5.81)
RDW: 21.9 % — ABNORMAL HIGH (ref 11.5–15.5)
WBC: 10.5 10*3/uL (ref 4.0–10.5)
nRBC: 0.2 % (ref 0.0–0.2)

## 2019-02-19 SURGERY — ESOPHAGOGASTRODUODENOSCOPY (EGD) WITH PROPOFOL
Anesthesia: General

## 2019-02-19 MED ORDER — FERROUS SULFATE 325 (65 FE) MG PO TABS
325.0000 mg | ORAL_TABLET | Freq: Two times a day (BID) | ORAL | Status: DC
  Administered 2019-02-19: 325 mg via ORAL
  Filled 2019-02-19: qty 1

## 2019-02-19 MED ORDER — NICOTINE 14 MG/24HR TD PT24: 14.0000 mg | MEDICATED_PATCH | Freq: Every day | TRANSDERMAL | 0 refills | Status: DC

## 2019-02-19 MED ORDER — PROPOFOL 10 MG/ML IV BOLUS
INTRAVENOUS | Status: AC
  Filled 2019-02-19: qty 20

## 2019-02-19 MED ORDER — KETAMINE HCL 10 MG/ML IJ SOLN
INTRAMUSCULAR | Status: DC | PRN
  Administered 2019-02-19: 30 mg via INTRAVENOUS

## 2019-02-19 MED ORDER — LACTATED RINGERS IV SOLN: INTRAVENOUS | Status: DC

## 2019-02-19 MED ORDER — PROPOFOL 500 MG/50ML IV EMUL
INTRAVENOUS | Status: DC | PRN
  Administered 2019-02-19: 150 ug/kg/min via INTRAVENOUS

## 2019-02-19 MED ORDER — HYDROCODONE-ACETAMINOPHEN 7.5-325 MG PO TABS: 1.0000 | ORAL_TABLET | Freq: Once | ORAL | Status: DC | PRN

## 2019-02-19 MED ORDER — PHENYLEPHRINE HCL (PRESSORS) 10 MG/ML IV SOLN
INTRAVENOUS | Status: DC | PRN
  Administered 2019-02-19 (×2): 100 ug via INTRAVENOUS

## 2019-02-19 MED ORDER — FOLIC ACID 1 MG PO TABS: 1.0000 mg | ORAL_TABLET | Freq: Every day | ORAL | 3 refills | Status: AC

## 2019-02-19 MED ORDER — PROMETHAZINE HCL 25 MG/ML IJ SOLN: 6.2500 mg | INTRAMUSCULAR | Status: DC | PRN

## 2019-02-19 MED ORDER — THIAMINE HCL 100 MG PO TABS: 100.0000 mg | ORAL_TABLET | Freq: Every day | ORAL | 1 refills | Status: DC

## 2019-02-19 MED ORDER — HYDROMORPHONE HCL 1 MG/ML IJ SOLN: 0.2500 mg | INTRAMUSCULAR | Status: DC | PRN

## 2019-02-19 MED ORDER — FERROUS SULFATE 325 (65 FE) MG PO TBEC: 1.0000 | DELAYED_RELEASE_TABLET | Freq: Two times a day (BID) | ORAL | 3 refills | Status: AC

## 2019-02-19 MED ORDER — ADULT MULTIVITAMIN W/MINERALS CH: 1.0000 | ORAL_TABLET | Freq: Every day | ORAL | 3 refills | Status: DC

## 2019-02-19 MED ORDER — SPIRONOLACTONE 25 MG PO TABS: 25.0000 mg | ORAL_TABLET | Freq: Every day | ORAL | 2 refills | Status: AC

## 2019-02-19 MED ORDER — STERILE WATER FOR IRRIGATION IR SOLN
Status: DC | PRN
  Administered 2019-02-19: 13:00:00 1.5 mL

## 2019-02-19 MED ORDER — ENTRESTO 97-103 MG PO TABS: 1.0000 | ORAL_TABLET | Freq: Two times a day (BID) | ORAL | 4 refills | Status: AC

## 2019-02-19 MED ORDER — FUROSEMIDE 40 MG PO TABS: 40.0000 mg | ORAL_TABLET | Freq: Every day | ORAL | 3 refills | Status: AC

## 2019-02-19 MED ORDER — CARVEDILOL 12.5 MG PO TABS: 12.5000 mg | ORAL_TABLET | Freq: Two times a day (BID) | ORAL | 6 refills | Status: AC

## 2019-02-19 MED ORDER — KETAMINE HCL 50 MG/5ML IJ SOSY
PREFILLED_SYRINGE | INTRAMUSCULAR | Status: AC
  Filled 2019-02-19: qty 5

## 2019-02-19 MED ORDER — SODIUM CHLORIDE (PF) 0.9 % IJ SOLN
INTRAMUSCULAR | Status: AC
  Filled 2019-02-19: qty 30

## 2019-02-19 NOTE — Op Note (Signed)
[facility] Patient Name: Paul Hahn Procedure Date: 02/19/2019 1:04 PM MRN: [id number] Date of Birth: 1975-10-23 Attending MD: Lionel December , MD CSN: [id number] Age: 43 Admit Type: Inpatient Procedure:                Colonoscopy Indications:              Unexplained iron deficiency anemia Providers:                Lionel December, MD, Buel Ream. Thomasena Edis RN, RN,                            Pandora Leiter, Technician Referring MD:             Shon Hale, MD Medicines:                Propofol per Anesthesia Complications:            No immediate complications. Estimated Blood Loss:     Estimated blood loss: none. Procedure:                Pre-Anesthesia Assessment:                           - Prior to the procedure, a History and Physical                            was performed, and patient medications and                            allergies were reviewed. The patient's tolerance of                            previous anesthesia was also reviewed. The risks                            and benefits of the procedure and the sedation                            options and risks were discussed with the patient.                            All questions were answered, and informed consent                            was obtained. Prior Anticoagulants: The patient has                            taken no previous anticoagulant or antiplatelet                            agents. ASA Grade Assessment: III - A patient with                            severe systemic disease. After reviewing the risks  and benefits, the patient was deemed in                            satisfactory condition to undergo the procedure.                           After obtaining informed consent, the colonoscope                            was passed under direct vision. Throughout the                            procedure, the patient's blood pressure, pulse, and          oxygen saturations were monitored continuously. The                            [id number]-[id number] ([id number]) scope was introduced through                            the anus and advanced to the the cecum, identified                            by appendiceal orifice and ileocecal valve. The                            colonoscopy was performed without difficulty. The                            patient tolerated the procedure well. The quality                            of the bowel preparation was fair. The ileocecal                            valve, appendiceal orifice, and rectum were                            photographed. Scope In: 1:06:16 PM Scope Out: 1:23:03 PM Scope Withdrawal Time: 0 hours 9 minutes 44 seconds  Total Procedure Duration: 0 hours 16 minutes 47 seconds  Findings:      The perianal exam findings include Chronic appearing ulcer in the       midline posterior to anal orifice. Indurated perianal mucosa with       decreases sphincter tone.      The colon (entire examined portion) appeared normal. Impression:               - Preparation of the colon was fair.                           - Extensive perianal and anal skin changes due to                            hidradenitis suppurativa.                           -  The entire examined colon is normal.                           - No specimens collected. Moderate Sedation:      Per Anesthesia Care Recommendation:           - Return patient to hospital ward.                           - Cardiac diet today.                           - Continue present medications.                           - Resume ferrous sulfate at 325 mg po bid.                           - Repeat colonoscopy in 5 years since prep marginal                            on todays exam. Procedure Code(s):        --- Professional ---                           [phone], Colonoscopy, flexible; diagnostic, including                            collection of specimen(s)  by brushing or washing,                            when performed (separate procedure) Diagnosis Code(s):        --- Professional ---                           D50.9, Iron deficiency anemia, unspecified CPT copyright 2019 American Medical Association. All rights reserved. The codes documented in this report are preliminary and upon coder review may  be revised to meet current compliance requirements. Hildred Laser, MD Hildred Laser, MD 02/19/2019 1:43:39 PM This report has been signed electronically. Number of Addenda: 0

## 2019-02-19 NOTE — Anesthesia Preprocedure Evaluation (Signed)
Anesthesia Evaluation  Patient identified by MRN, date of birth, ID band Patient awake  General Assessment Comment:States vital dropped once -denies ICU stay after that   Reviewed: Allergy & Precautions, NPO status , Patient's Chart, lab work & pertinent test results  History of Anesthesia Complications (+) history of anesthetic complications  Airway Mallampati: I  TM Distance: >3 FB Neck ROM: Full    Dental no notable dental hx. (+) Teeth Intact   Pulmonary shortness of breath and with exertion, pneumonia, resolved, Current Smoker and Patient abstained from smoking.,    Pulmonary exam normal breath sounds clear to auscultation       Cardiovascular Exercise Tolerance: Good +CHF  Normal cardiovascular examI Rhythm:Regular Rate:Normal     Neuro/Psych PSYCHIATRIC DISORDERS negative neurological ROS     GI/Hepatic negative GI ROS, Neg liver ROS, Bowel prep,Last Cr 1.76 up from 1.56 Here for GI w/u of anemia /IDA chronic  Also has skin reasons for low Hgb   Endo/Other  negative endocrine ROS  Renal/GU Renal InsufficiencyRenal disease  negative genitourinary   Musculoskeletal negative musculoskeletal ROS (+)   Abdominal   Peds negative pediatric ROS (+)  Hematology negative hematology ROS (+) anemia ,   Anesthesia Other Findings   Reproductive/Obstetrics negative OB ROS                             Anesthesia Physical Anesthesia Plan  ASA: III  Anesthesia Plan: General   Post-op Pain Management:    Induction: Intravenous  PONV Risk Score and Plan: 1 and TIVA, Propofol infusion and Treatment may vary due to age or medical condition  Airway Management Planned: Simple Face Mask and Nasal Cannula  Additional Equipment:   Intra-op Plan:   Post-operative Plan:   Informed Consent: I have reviewed the patients History and Physical, chart, labs and discussed the procedure including the  risks, benefits and alternatives for the proposed anesthesia with the patient or authorized representative who has indicated his/her understanding and acceptance.     Dental advisory given  Plan Discussed with: CRNA  Anesthesia Plan Comments: (Plan Full PPE use  Plan GA with GETA as needed d/w pt -WTP with same after Q&A)        Anesthesia Quick Evaluation
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
## 2019-02-19 NOTE — Anesthesia Postprocedure Evaluation (Signed)
Anesthesia Post Note  Patient: Paul Hahn  Procedure(s) Performed: ESOPHAGOGASTRODUODENOSCOPY (EGD) WITH PROPOFOL (N/A ) COLONOSCOPY WITH PROPOFOL (N/A ) BIOPSY  Patient location during evaluation: PACU Anesthesia Type: MAC Level of consciousness: oriented, awake and alert and awake Pain management: pain level controlled Vital Signs Assessment: post-procedure vital signs reviewed and stable Respiratory status: spontaneous breathing, nonlabored ventilation and respiratory function stable Cardiovascular status: stable Postop Assessment: no apparent nausea or vomiting Anesthetic complications: no     Last Vitals:  Vitals:   02/19/19 1232 02/19/19 1330  BP: (!) 125/91 (!) 87/57  Pulse: 93 91  Resp: (!) 22 (!) 31  Temp: 37.3 C (P) 36.6 C  SpO2: 99% 92%    Last Pain:  Vitals:   02/19/19 1250  TempSrc:   PainSc: 0-No pain                 Jamar Casagrande

## 2019-02-19 NOTE — Discharge Instructions (Signed)
1)Very low-salt diet advised 2)Weigh yourself daily, call if you gain more than 3 pounds in 1 day or more than 5 pounds in 1 week as your diuretic medications may need to be adjusted 3)Limit your Fluid  intake to no more than 60 ounces (1.8 Liters) per day 4)Avoid ibuprofen/Advil/Aleve/Motrin/Goody Powders/Naproxen/BC powders/Meloxicam/Diclofenac/Indomethacin and other Nonsteroidal anti-inflammatory medications as these will make you more likely to bleed and can cause stomach ulcers, can also cause Kidney problems.  5) follow-up with your dermatologist about starting Humira to try to treat your hidradenitis suppurativa 6) follow-up with general surgeon/plastic surgeon to evaluate your peri-anal (backside lesions) 7) complete abstinence from alcohol strongly advised-- consider AA meetings 8) complete abstinence from tobacco strongly advised--- please use nicotine patch 9)----Please take your heart medications exactly as prescribed and follow-up with the cardiologist as previously scheduled 10)-you need a repeat CBC and BMP blood test on Monday, 02/22/2019 due to slight worsening of your kidney function and your anemia

## 2019-02-19 NOTE — Transfer of Care (Signed)
Immediate Anesthesia Transfer of Care Note  Patient: Paul Hahn  Procedure(s) Performed: ESOPHAGOGASTRODUODENOSCOPY (EGD) WITH PROPOFOL (N/A ) COLONOSCOPY WITH PROPOFOL (N/A ) BIOPSY  Patient Location: PACU  Anesthesia Type:MAC  Level of Consciousness: awake, alert , oriented and patient cooperative  Airway & Oxygen Therapy: Patient Spontanous Breathing and Patient connected to nasal cannula oxygen  Post-op Assessment: Report given to RN, Post -op Vital signs reviewed and stable and Patient moving all extremities X 4  Post vital signs: Reviewed and stable  Last Vitals:  Vitals Value Taken Time  BP 94/79 02/19/19 1331  Temp    Pulse 93 02/19/19 1334  Resp 49 02/19/19 1334  SpO2 93 % 02/19/19 1334  Vitals shown include unvalidated device data.  Last Pain:  Vitals:   02/19/19 1250  TempSrc:   PainSc: 0-No pain      Patients Stated Pain Goal: 6 (02/40/97 3532)  Complications: No apparent anesthesia complications

## 2019-02-19 NOTE — Progress Notes (Signed)
Brief EGD and colonoscopy notes   Small sliding hiatal hernia otherwise normal EGD Biopsy taken from second part of duodenum.  Colonoscopy completed to cecum. No abnormality noted.  Quality of exam compromised by marginal prep.

## 2019-02-19 NOTE — Progress Notes (Addendum)
Patient having clear liquids for breakfast, asking for crackers. This nurse explained the reasoning for clear liquids only and NPO after for procedure today. Patient upset that "I haven't eaten since Tuesday, I'm hungry. Can you please ask him if I can have some crackers?" Dr. Laural Golden paged by this nurse, denied request for crackers. Patient to be notified. When this nurse brought PO medications into room and informed patietn of each, patient refused at this time stating "you can't take that stuff without food, it'll make me sick." This nurse explained medications can be administered after procedure if patient wishes, patient agreed. Will continue to monitor.

## 2019-02-19 NOTE — Progress Notes (Signed)
  Subjective:  Patient says he is hungry.  He denies chest pain shortness of breath or abdominal pain. He had clear liquids at breakfast.  Objective: Blood pressure 117/86, pulse 90, temperature 98.3 F (36.8 C), temperature source Oral, resp. rate 20, height 6' (1.829 m), weight 72.4 kg, SpO2 100 %. Patient is alert and in no acute distress. Has extensive facial involvement with hidradenitis suppurativa. Cardiac exam with regular rhythm normal S1 and S2.  No murmur gallop noted.  Lungs are clear to auscultation. Abdomen is flat and soft without tenderness organomegaly or masses. No peripheral edema noted.   Labs/studies Results:   CBC Latest Ref Rng & Units 02/19/2019 02/18/2019 02/17/2019  WBC 4.0 - 10.5 K/uL 10.5 10.9(H) -  Hemoglobin 13.0 - 17.0 g/dL 8.3(L) 7.4(L) 6.7(LL)  Hematocrit 39.0 - 52.0 % 27.1(L) 24.8(L) 24.0(L)  Platelets 150 - 400 K/uL 425(H) 447(H) -    CMP Latest Ref Rng & Units 02/19/2019 02/18/2019 02/17/2019  Glucose 70 - 99 mg/dL 88 86 107(H)  BUN 6 - 20 mg/dL 23(H) 21(H) 24(H)  Creatinine 0.61 - 1.24 mg/dL 1.76(H) 1.56(H) 1.67(H)  Sodium 135 - 145 mmol/L 132(L) 131(L) 133(L)  Potassium 3.5 - 5.1 mmol/L 4.1 4.1 3.9  Chloride 98 - 111 mmol/L 100 99 102  CO2 22 - 32 mmol/L '23 22 22  '$ Calcium 8.9 - 10.3 mg/dL 8.2(L) 8.1(L) 7.9(L)  Total Protein 6.5 - 8.1 g/dL - 10.2(H) -  Total Bilirubin 0.3 - 1.2 mg/dL - 1.0 -  Alkaline Phos 38 - 126 U/L - 127(H) -  AST 15 - 41 U/L - 27 -  ALT 0 - 44 U/L - 17 -    Hepatic Function Latest Ref Rng & Units 02/19/2019 02/18/2019 07/25/2017  Total Protein 6.5 - 8.1 g/dL - 10.2(H) 9.9(H)  Albumin 3.5 - 5.0 g/dL 2.3(L) 2.3(L) 2.4(L)  AST 15 - 41 U/L - 27 18  ALT 0 - 44 U/L - 17 12(L)  Alk Phosphatase 38 - 126 U/L - 127(H) 63  Total Bilirubin 0.3 - 1.2 mg/dL - 1.0 0.4  Bilirubin, Direct 0.1 - 0.5 mg/dL - - -      Assessment:  #1.  Acute on chronic anemia.  Patient has well-documented iron deficiency anemia but no history  of GI bleed.  He has received 3 units of PRBCs and his hemoglobin is now up to 8.3 g.  #2.  Cardiomyopathy.  Patient appears to be at baseline with compensated CHF.  #3.  Extensive hidradenitis suppurativa.  #4.  Hypoalbuminemia.  This may be due to his skin disorder.  He has history of alcohol abuse but his transaminases are normal.  I doubt the low albumin is due to liver disease.  Recommendations  Proceed with esophagogastroduodenoscopy and colonoscopy under monitored anesthesia care. Patient is agreeable.

## 2019-02-19 NOTE — Op Note (Signed)
[facility] Patient Name: Paul Hahn Procedure Date: 02/19/2019 12:16 PM MRN: [id number] Date of Birth: [birth year] Attending MD: Lionel December , MD CSN: [id number] Age: 43 Admit Type: Inpatient Procedure:                Upper GI endoscopy Indications:              Unexplained iron deficiency anemia Providers:                Lionel December, MD, Buel Ream. Thomasena Edis RN, RN,                            Pandora Leiter, Technician Referring MD:             Shon Hale, MD Medicines:                Propofol per Anesthesia Complications:            No immediate complications. Estimated Blood Loss:     Estimated blood loss was minimal. Procedure:                Pre-Anesthesia Assessment:                           - Prior to the procedure, a History and Physical                            was performed, and patient medications and                            allergies were reviewed. The patient's tolerance of                            previous anesthesia was also reviewed. The risks                            and benefits of the procedure and the sedation                            options and risks were discussed with the patient.                            All questions were answered, and informed consent                            was obtained. Prior Anticoagulants: The patient has                            taken no previous anticoagulant or antiplatelet                            agents. ASA Grade Assessment: III - A patient with                            severe systemic disease. After reviewing the risks  and benefits, the patient was deemed in                            satisfactory condition to undergo the procedure.                           After obtaining informed consent, the endoscope was                            passed under direct vision. Throughout the                            procedure, the patient's blood pressure, pulse, and                      oxygen saturations were monitored continuously. The                            GIF-H190 ([id number]) scope was introduced through the                            mouth, and advanced to the second part of duodenum.                            The upper GI endoscopy was accomplished without                            difficulty. The patient tolerated the procedure                            well. Scope In: 12:55:35 PM Scope Out: 1:02:14 PM Total Procedure Duration: 0 hours 6 minutes 39 seconds  Findings:      The examined esophagus was normal.      The Z-line was irregular and was found 44 cm from the incisors.      A 2 cm hiatal hernia was present.      The entire examined stomach was normal.      The duodenal bulb and second portion of the duodenum were normal.       Biopsies for histology were taken with a cold forceps for evaluation of       celiac disease. The pathology specimen was placed into Bottle Number 1. Impression:               - Normal esophagus.                           - Z-line irregular, 44 cm from the incisors.                           - 2 cm hiatal hernia.                           - Normal stomach.                           - Normal duodenal bulb and second portion of the  duodenum. Biopsied. Moderate Sedation:      Per Anesthesia Care Recommendation:           - Patient has a contact number available for                            emergencies. The signs and symptoms of potential                            delayed complications were discussed with the                            patient. Return to normal activities tomorrow.                            Written discharge instructions were provided to the                            patient.                           - Resume heart healthy diet today.                           - Return patient to hospital ward for ongoing care.                           - Continue present  medications.                           - No aspirin, ibuprofen, naproxen, or other                            non-steroidal anti-inflammatory drugs.                           - Await pathology results. Procedure Code(s):        --- Professional ---                           [phone], Esophagogastroduodenoscopy, flexible,                            transoral; with biopsy, single or multiple Diagnosis Code(s):        --- Professional ---                           K22.8, Other specified diseases of esophagus                           K44.9, Diaphragmatic hernia without obstruction or                            gangrene                           D50.9, Iron deficiency anemia, unspecified CPT copyright 2019 [employer]. All  rights reserved. The codes documented in this report are preliminary and upon coder review may  be revised to meet current compliance requirements. Lionel December, MD Lionel December, MD 02/19/2019 1:35:40 PM This report has been signed electronically. Number of Addenda: 0

## 2019-02-22 LAB — SURGICAL PATHOLOGY

## 2019-02-22 NOTE — Addendum Note (Signed)
Addendum  created 02/22/19 0829 by Vista Deck, CRNA   Charge Capture section accepted

## 2019-02-28 ENCOUNTER — Other Ambulatory Visit: Payer: Self-pay

## 2019-02-28 ENCOUNTER — Emergency Department (HOSPITAL_COMMUNITY)
Admission: EM | Admit: 2019-02-28 | Discharge: 2019-02-28 | Disposition: A | Discharge status: Home / Self Care | Payer: Medicaid - Out of State | Attending: Emergency Medicine | Admitting: Emergency Medicine

## 2019-02-28 ENCOUNTER — Emergency Department (HOSPITAL_COMMUNITY): Payer: Medicaid - Out of State

## 2019-02-28 ENCOUNTER — Encounter (HOSPITAL_COMMUNITY): Payer: Self-pay | Admitting: Emergency Medicine

## 2019-02-28 DIAGNOSIS — I509 Heart failure, unspecified: Secondary | ICD-10-CM

## 2019-02-28 DIAGNOSIS — N183 Chronic kidney disease, stage 3 unspecified: Secondary | ICD-10-CM | POA: Diagnosis not present

## 2019-02-28 DIAGNOSIS — I5023 Acute on chronic systolic (congestive) heart failure: Secondary | ICD-10-CM | POA: Insufficient documentation

## 2019-02-28 DIAGNOSIS — F17210 Nicotine dependence, cigarettes, uncomplicated: Secondary | ICD-10-CM | POA: Insufficient documentation

## 2019-02-28 DIAGNOSIS — R0602 Shortness of breath: Secondary | ICD-10-CM | POA: Diagnosis not present

## 2019-02-28 LAB — COMPREHENSIVE METABOLIC PANEL
ALT: 22 U/L (ref 0–44)
AST: 28 U/L (ref 15–41)
Albumin: 2.3 g/dL — ABNORMAL LOW (ref 3.5–5.0)
Alkaline Phosphatase: 118 U/L (ref 38–126)
Anion gap: 8 (ref 5–15)
BUN: 22 mg/dL — ABNORMAL HIGH (ref 6–20)
CO2: 20 mmol/L — ABNORMAL LOW (ref 22–32)
Calcium: 7.9 mg/dL — ABNORMAL LOW (ref 8.9–10.3)
Chloride: 105 mmol/L (ref 98–111)
Creatinine, Ser: 1.43 mg/dL — ABNORMAL HIGH (ref 0.61–1.24)
GFR calc Af Amer: >60 mL/min (ref >60)
GFR calc non Af Amer: >60 mL/min (ref >60)
Glucose, Bld: 93 mg/dL (ref 70–99)
Potassium: 3.9 mmol/L (ref 3.5–5.1)
Sodium: 133 mmol/L — ABNORMAL LOW (ref 135–145)
Total Bilirubin: 0.5 mg/dL (ref 0.3–1.2)
Total Protein: 9.8 g/dL — ABNORMAL HIGH (ref 6.5–8.1)

## 2019-02-28 LAB — CBC WITH DIFFERENTIAL/PLATELET
Abs Immature Granulocytes: 0.04 10*3/uL (ref 0.00–0.07)
Basophils Absolute: 0.1 10*3/uL (ref 0.0–0.1)
Basophils Relative: 1 %
Eosinophils Absolute: 0.3 10*3/uL (ref 0.0–0.5)
Eosinophils Relative: 3 %
HCT: 25.9 % — ABNORMAL LOW (ref 39.0–52.0)
Hemoglobin: 7.5 g/dL — ABNORMAL LOW (ref 13.0–17.0)
Immature Granulocytes: 1 %
Lymphocytes Relative: 21 %
Lymphs Abs: 1.8 10*3/uL (ref 0.7–4.0)
MCH: 24.5 pg — ABNORMAL LOW (ref 26.0–34.0)
MCHC: 29 g/dL — ABNORMAL LOW (ref 30.0–36.0)
MCV: 84.6 fL (ref 80.0–100.0)
Monocytes Absolute: 0.5 10*3/uL (ref 0.1–1.0)
Monocytes Relative: 6 %
Neutro Abs: 6 10*3/uL (ref 1.7–7.7)
Neutrophils Relative %: 68 %
Platelets: 401 10*3/uL — ABNORMAL HIGH (ref 150–400)
RBC: 3.06 MIL/uL — ABNORMAL LOW (ref 4.22–5.81)
RDW: 22.8 % — ABNORMAL HIGH (ref 11.5–15.5)
WBC: 8.7 10*3/uL (ref 4.0–10.5)
nRBC: 0 % (ref 0.0–0.2)

## 2019-02-28 LAB — TYPE AND SCREEN
ABO/RH(D): O POS
Antibody Screen: NEGATIVE

## 2019-02-28 LAB — ETHANOL: Alcohol, Ethyl (B): <10 mg/dL (ref <10)

## 2019-02-28 LAB — BRAIN NATRIURETIC PEPTIDE: B Natriuretic Peptide: 4021 pg/mL — ABNORMAL HIGH (ref 0.0–100.0)

## 2019-02-28 LAB — TROPONIN I (HIGH SENSITIVITY): Troponin I (High Sensitivity): 12 ng/L (ref <18)

## 2019-02-28 MED ORDER — FENTANYL CITRATE (PF) 100 MCG/2ML IJ SOLN
50.0000 ug | Freq: Once | INTRAMUSCULAR | Status: AC
  Administered 2019-02-28: 50 ug via INTRAVENOUS
  Filled 2019-02-28: qty 2

## 2019-02-28 MED ORDER — BENZONATATE 100 MG PO CAPS: 100.0000 mg | ORAL_CAPSULE | Freq: Three times a day (TID) | ORAL | 0 refills | Status: AC

## 2019-02-28 MED ORDER — ONDANSETRON HCL 4 MG/2ML IJ SOLN
4.0000 mg | Freq: Once | INTRAMUSCULAR | Status: AC
  Administered 2019-02-28: 4 mg via INTRAVENOUS
  Filled 2019-02-28: qty 2

## 2019-02-28 MED ORDER — ALBUTEROL SULFATE HFA 108 (90 BASE) MCG/ACT IN AERS
4.0000 | INHALATION_SPRAY | Freq: Once | RESPIRATORY_TRACT | Status: AC
  Administered 2019-02-28: 4 via RESPIRATORY_TRACT
  Filled 2019-02-28: qty 6.7

## 2019-02-28 MED ORDER — FUROSEMIDE 10 MG/ML IJ SOLN
40.0000 mg | INTRAMUSCULAR | Status: AC
  Administered 2019-02-28: 40 mg via INTRAVENOUS
  Filled 2019-02-28: qty 4

## 2019-02-28 MED ORDER — MORPHINE SULFATE (PF) 4 MG/ML IV SOLN
4.0000 mg | Freq: Once | INTRAVENOUS | Status: AC
  Administered 2019-02-28: 4 mg via INTRAVENOUS
  Filled 2019-02-28: qty 1

## 2019-02-28 MED ORDER — AEROCHAMBER PLUS FLO-VU MISC
1.0000 | Freq: Once | Status: AC
  Administered 2019-02-28: 1
  Filled 2019-02-28: qty 1

## 2019-02-28 NOTE — Discharge Instructions (Addendum)
Your testing today shows that your shortness of breath is likely related to your heart, you have chronic congestive heart failure which means that your heart is chronically weak.  Please take the following medications to help improve this  Increase your Lasix to 40 mg twice a day for the next 5 days, then back to once a day after that.  You may use Tessalon as needed for your cough and albuterol inhaler 2 puffs every 4 hours as needed for shortness of breath.  You are to return to the emergency department for severe worsening coughing shortness of breath fevers chills or chest pain.

## 2019-02-28 NOTE — ED Triage Notes (Signed)
Pt with c/o SOB and upper abdominal pain that started 2-3 days ago.

## 2019-02-28 NOTE — ED Notes (Signed)
Pt left with mom driving. Pain 5/10

## 2019-02-28 NOTE — ED Provider Notes (Signed)
This patient is a 43 year old male well-known to me as I saw him last time he was in the emergency department a week ago.  He had been admitted to the hospital because of shortness of breath, he had severe anemia down to around 5.5 and was requiring blood transfusion.  He had both a colonoscopy and an endoscopy in the hospital both of which were negative for sources of bleeding but was recommended not to take anti-inflammatories and to abstain from alcohol which he says he has.  Despite this he does have intermittent shortness of breath, he is known to have congestive heart failure with his last ejection fraction being between 35 and 40%.  He denies peripheral edema, denies abdominal distention though he does have some chronic abdominal discomfort.  He has been taking his furosemide at 40 mg/day but states he does not make much urine compared to when he was in the hospital getting it through an IV.  I have reviewed his lab work today including his chest x-ray, there is no signs of pulmonary edema though there is some cardiomegaly.  His renal insufficiency is at baseline if not improved from prior, he has a BNP which is elevated around 4000 though his recent measurements were close to that as well.  On exam the patient is well-appearing, no distress, sleeping without hypoxia or tachypnea and has clear lungs on my exam.  He has no peripheral edema, no JVD, he wants to go home.  I think this is reasonable and I will give him an IV Lasix dose and have him increase his Lasix for the next 5 days.  He is agreeable to this plan and at this time I do not see an indication for admission to the hospital.  He is .not having any chest pain  Final diagnoses:  Shortness of breath  Chronic congestive heart failure, unspecified heart failure type ([place])      Noemi Chapel, MD 02/28/19 [phone]

## 2019-02-28 NOTE — ED Notes (Signed)
Patient refused COVID testing.
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
## 2019-03-08 ENCOUNTER — Other Ambulatory Visit: Payer: Self-pay

## 2019-03-08 ENCOUNTER — Emergency Department (HOSPITAL_COMMUNITY): Payer: Medicaid - Out of State

## 2019-03-08 ENCOUNTER — Encounter (HOSPITAL_COMMUNITY): Payer: Self-pay | Admitting: Emergency Medicine

## 2019-03-08 ENCOUNTER — Emergency Department (HOSPITAL_COMMUNITY)
Admission: EM | Admit: 2019-03-08 | Discharge: 2019-03-08 | Disposition: A | Discharge status: Home / Self Care | Payer: Medicaid - Out of State | Attending: Emergency Medicine | Admitting: Emergency Medicine

## 2019-03-08 DIAGNOSIS — Z20822 Contact with and (suspected) exposure to covid-19: Secondary | ICD-10-CM | POA: Diagnosis not present

## 2019-03-08 DIAGNOSIS — N183 Chronic kidney disease, stage 3 unspecified: Secondary | ICD-10-CM | POA: Diagnosis not present

## 2019-03-08 DIAGNOSIS — Z79899 Other long term (current) drug therapy: Secondary | ICD-10-CM | POA: Insufficient documentation

## 2019-03-08 DIAGNOSIS — R0600 Dyspnea, unspecified: Secondary | ICD-10-CM | POA: Diagnosis not present

## 2019-03-08 DIAGNOSIS — R1010 Upper abdominal pain, unspecified: Secondary | ICD-10-CM | POA: Diagnosis not present

## 2019-03-08 DIAGNOSIS — I130 Hypertensive heart and chronic kidney disease with heart failure and stage 1 through stage 4 chronic kidney disease, or unspecified chronic kidney disease: Secondary | ICD-10-CM | POA: Diagnosis not present

## 2019-03-08 DIAGNOSIS — R0602 Shortness of breath: Secondary | ICD-10-CM | POA: Diagnosis present

## 2019-03-08 DIAGNOSIS — F17210 Nicotine dependence, cigarettes, uncomplicated: Secondary | ICD-10-CM | POA: Diagnosis not present

## 2019-03-08 DIAGNOSIS — R109 Unspecified abdominal pain: Secondary | ICD-10-CM

## 2019-03-08 DIAGNOSIS — I5022 Chronic systolic (congestive) heart failure: Secondary | ICD-10-CM | POA: Insufficient documentation

## 2019-03-08 LAB — COMPREHENSIVE METABOLIC PANEL
ALT: 23 U/L (ref 0–44)
AST: 30 U/L (ref 15–41)
Albumin: 2.2 g/dL — ABNORMAL LOW (ref 3.5–5.0)
Alkaline Phosphatase: 178 U/L — ABNORMAL HIGH (ref 38–126)
Anion gap: 6 (ref 5–15)
BUN: 23 mg/dL — ABNORMAL HIGH (ref 6–20)
CO2: 23 mmol/L (ref 22–32)
Calcium: 8 mg/dL — ABNORMAL LOW (ref 8.9–10.3)
Chloride: 102 mmol/L (ref 98–111)
Creatinine, Ser: 1.42 mg/dL — ABNORMAL HIGH (ref 0.61–1.24)
GFR calc Af Amer: >60 mL/min (ref >60)
GFR calc non Af Amer: >60 mL/min (ref >60)
Glucose, Bld: 100 mg/dL — ABNORMAL HIGH (ref 70–99)
Potassium: 4.1 mmol/L (ref 3.5–5.1)
Sodium: 131 mmol/L — ABNORMAL LOW (ref 135–145)
Total Bilirubin: 0.6 mg/dL (ref 0.3–1.2)
Total Protein: 9.8 g/dL — ABNORMAL HIGH (ref 6.5–8.1)

## 2019-03-08 LAB — CBC WITH DIFFERENTIAL/PLATELET
Abs Immature Granulocytes: 0.03 10*3/uL (ref 0.00–0.07)
Basophils Absolute: 0.1 10*3/uL (ref 0.0–0.1)
Basophils Relative: 1 %
Eosinophils Absolute: 0.1 10*3/uL (ref 0.0–0.5)
Eosinophils Relative: 1 %
HCT: 26.3 % — ABNORMAL LOW (ref 39.0–52.0)
Hemoglobin: 7.7 g/dL — ABNORMAL LOW (ref 13.0–17.0)
Immature Granulocytes: 0 %
Lymphocytes Relative: 19 %
Lymphs Abs: 1.8 10*3/uL (ref 0.7–4.0)
MCH: 24.5 pg — ABNORMAL LOW (ref 26.0–34.0)
MCHC: 29.3 g/dL — ABNORMAL LOW (ref 30.0–36.0)
MCV: 83.8 fL (ref 80.0–100.0)
Monocytes Absolute: 0.6 10*3/uL (ref 0.1–1.0)
Monocytes Relative: 6 %
Neutro Abs: 6.8 10*3/uL (ref 1.7–7.7)
Neutrophils Relative %: 73 %
Platelets: 405 10*3/uL — ABNORMAL HIGH (ref 150–400)
RBC: 3.14 MIL/uL — ABNORMAL LOW (ref 4.22–5.81)
RDW: 22.5 % — ABNORMAL HIGH (ref 11.5–15.5)
WBC: 9.3 10*3/uL (ref 4.0–10.5)
nRBC: 0 % (ref 0.0–0.2)

## 2019-03-08 LAB — URINALYSIS, ROUTINE W REFLEX MICROSCOPIC
Bacteria, UA: NONE SEEN
Bilirubin Urine: NEGATIVE
Glucose, UA: NEGATIVE mg/dL
Ketones, ur: NEGATIVE mg/dL
Leukocytes,Ua: NEGATIVE
Nitrite: NEGATIVE
Protein, ur: NEGATIVE mg/dL
Specific Gravity, Urine: 1.034 — ABNORMAL HIGH (ref 1.005–1.030)
pH: 5 (ref 5.0–8.0)

## 2019-03-08 LAB — LIPASE, BLOOD: Lipase: 16 U/L (ref 11–51)

## 2019-03-08 LAB — BRAIN NATRIURETIC PEPTIDE: B Natriuretic Peptide: >4500 pg/mL — ABNORMAL HIGH (ref 0.0–100.0)

## 2019-03-08 LAB — SARS CORONAVIRUS 2 (TAT 6-24 HRS): SARS Coronavirus 2: NEGATIVE

## 2019-03-08 MED ORDER — HYDROMORPHONE HCL 1 MG/ML IJ SOLN
1.0000 mg | Freq: Once | INTRAMUSCULAR | Status: AC
  Administered 2019-03-08: 12:00:00 1 mg via INTRAVENOUS
  Filled 2019-03-08: qty 1

## 2019-03-08 MED ORDER — IOHEXOL 300 MG/ML  SOLN
100.0000 mL | Freq: Once | INTRAMUSCULAR | Status: AC | PRN
  Administered 2019-03-08: 100 mL via INTRAVENOUS

## 2019-03-08 NOTE — ED Triage Notes (Signed)
Pt states he has been sob with abd pain for several days.

## 2019-03-08 NOTE — Discharge Instructions (Addendum)
You need to follow-up with your family doctor. And cardiologist. Keep taking your medications as prescribed. Your chest x-ray today does not show any acute abnormalities. Your hemoglobin level is stable.

## 2019-03-08 NOTE — ED Notes (Signed)
IV removed from left AC. Pt tolerated well. No bleeding, swelling, or irritation noted at sight.

## 2019-03-11 ENCOUNTER — Inpatient Hospital Stay (HOSPITAL_COMMUNITY)
Admission: RE | Admit: 2019-03-11 | Payer: Medicaid - Out of State | Source: Ambulatory Visit | Admitting: Internal Medicine

## 2019-03-12 ENCOUNTER — Telehealth (HOSPITAL_COMMUNITY): Payer: Self-pay | Admitting: *Deleted

## 2019-03-12 NOTE — Telephone Encounter (Signed)
Pt left vm stating he missed his appointment yesterday because he was hospitalized. Pt requested a call back to r/s. I called pt back no answer/left vm.

## 2019-03-17 ENCOUNTER — Telehealth (HOSPITAL_COMMUNITY): Payer: Self-pay
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
## 2019-03-17 NOTE — Telephone Encounter (Signed)
COVID-19 pre-appointment screening questions:   Do you have a history of COVID-19 or a positive test result in the past 7-10 days? NO  To the best of your knowledge, have you been in close contact with anyone with a confirmed diagnosis of COVID 19?YES;WAS TESTED AFTERWARDS(03/08/2019)  TEST CAME BACK NEG TOMORROW IS 10  DAYS SINCE TESTING  Have you had any one or more of the following: Fever, chills, cough, shortness of breath (out of the normal for you) or any flu-like symptoms?NO  Are you experiencing any of the following symptoms that is new or out of usual for you:NO  . Ear, nose or throat discomfort . Sore throat . Headache . Muscle Pain . Diarrhea . Loss of taste or smell   Reviewed all the following with patient: REVIEWED . Use of hand sanitizer when entering the building . Everyone is required to wear a mask in the building, if you do not have a mask we are happy to provide you with one when you arrive . NO Visitor guidelines   If patient answers YES to any of questions they must change to a virtual visit and place note in comments about symptoms

## 2019-03-18 ENCOUNTER — Encounter (HOSPITAL_COMMUNITY): Payer: Self-pay | Admitting: Internal Medicine

## 2019-03-18 ENCOUNTER — Ambulatory Visit (HOSPITAL_COMMUNITY)
Admission: RE | Admit: 2019-03-18 | Discharge: 2019-03-18 | Disposition: A | Discharge status: Home / Self Care | Payer: Medicaid - Out of State | Source: Ambulatory Visit | Attending: Internal Medicine | Admitting: Internal Medicine

## 2019-03-18 ENCOUNTER — Other Ambulatory Visit: Payer: Self-pay

## 2019-03-18 VITALS — BP 122/82 | HR 91 | Wt 159.0 lb

## 2019-03-18 DIAGNOSIS — Z86711 Personal history of pulmonary embolism: Secondary | ICD-10-CM | POA: Insufficient documentation

## 2019-03-18 DIAGNOSIS — D509 Iron deficiency anemia, unspecified: Secondary | ICD-10-CM | POA: Insufficient documentation

## 2019-03-18 DIAGNOSIS — I219 Acute myocardial infarction, unspecified: Secondary | ICD-10-CM | POA: Diagnosis not present

## 2019-03-18 DIAGNOSIS — Z882 Allergy status to sulfonamides status: Secondary | ICD-10-CM | POA: Diagnosis not present

## 2019-03-18 DIAGNOSIS — R9431 Abnormal electrocardiogram [ECG] [EKG]: Secondary | ICD-10-CM | POA: Diagnosis not present

## 2019-03-18 DIAGNOSIS — Z8249 Family history of ischemic heart disease and other diseases of the circulatory system: Secondary | ICD-10-CM | POA: Insufficient documentation

## 2019-03-18 DIAGNOSIS — N1831 Chronic kidney disease, stage 3a: Secondary | ICD-10-CM | POA: Diagnosis not present

## 2019-03-18 DIAGNOSIS — N183 Chronic kidney disease, stage 3 unspecified: Secondary | ICD-10-CM | POA: Insufficient documentation

## 2019-03-18 DIAGNOSIS — I428 Other cardiomyopathies: Secondary | ICD-10-CM | POA: Insufficient documentation

## 2019-03-18 DIAGNOSIS — L732 Hidradenitis suppurativa: Secondary | ICD-10-CM | POA: Diagnosis not present

## 2019-03-18 DIAGNOSIS — F1010 Alcohol abuse, uncomplicated: Secondary | ICD-10-CM | POA: Diagnosis not present

## 2019-03-18 DIAGNOSIS — Z888 Allergy status to other drugs, medicaments and biological substances status: Secondary | ICD-10-CM | POA: Diagnosis not present

## 2019-03-18 DIAGNOSIS — Z79899 Other long term (current) drug therapy: Secondary | ICD-10-CM | POA: Diagnosis not present

## 2019-03-18 DIAGNOSIS — I5022 Chronic systolic (congestive) heart failure: Secondary | ICD-10-CM | POA: Diagnosis not present

## 2019-03-18 DIAGNOSIS — Z872 Personal history of diseases of the skin and subcutaneous tissue: Secondary | ICD-10-CM | POA: Diagnosis not present

## 2019-03-18 DIAGNOSIS — I5082 Biventricular heart failure: Secondary | ICD-10-CM | POA: Diagnosis not present

## 2019-03-18 DIAGNOSIS — Z881 Allergy status to other antibiotic agents status: Secondary | ICD-10-CM | POA: Insufficient documentation

## 2019-03-18 DIAGNOSIS — Z7901 Long term (current) use of anticoagulants: Secondary | ICD-10-CM | POA: Diagnosis not present

## 2019-03-18 DIAGNOSIS — Z87891 Personal history of nicotine dependence: Secondary | ICD-10-CM | POA: Diagnosis not present

## 2019-03-18 NOTE — Addendum Note (Signed)
Encounter addended by: Noralee Space, RN on: 03/18/2019 3:01 PM  Actions taken: Visit diagnoses modified, Order list changed, Diagnosis association updated, Clinical Note Signed

## 2019-03-18 NOTE — Patient Instructions (Signed)
Your physician recommends that you schedule a follow-up appointment in: 2 months with echocardiogram  If you have any questions or concerns before your next appointment please send [place] a message through [place] or call our office at [phone].  At the [facility], you and your health needs are our priority. As part of our continuing mission to provide you with exceptional heart care, we have created designated Provider Care Teams. These Care Teams include your primary Cardiologist (physician) and Advanced Practice Providers (APPs- Physician Assistants and Nurse Practitioners) who all work together to provide you with the care you need, when you need it.   You may see any of the following providers on your designated Care Team at your next follow up: Marland Kitchen Dr Arvilla Meres . Dr Marca Ancona . Tonye Becket, NP . Robbie Lis, PA . Karle Plumber, PharmD   Please be sure to bring in all your medications bottles to every appointment.
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
## 2019-04-08 ENCOUNTER — Telehealth (HOSPITAL_COMMUNITY): Payer: Self-pay | Admitting: *Deleted

## 2019-04-08 NOTE — Telephone Encounter (Signed)
Last office note faxed to Dr.Karlyn Vernell Morgans at [facility] dermatology for Humira clearance.

## 2019-04-09 ENCOUNTER — Telehealth (HOSPITAL_COMMUNITY): Payer: Self-pay | Admitting: *Deleted

## 2019-04-09 NOTE — Telephone Encounter (Signed)
email sent

## 2019-04-09 NOTE — Telephone Encounter (Signed)
Dr Gunnar Fusi called, she states she is going to start pt on Humira and wanted to make sure it was ok w/Dr Bensimhon.  She ask that we email her the response/recommendations at: [email or web address]  Message sent to Dr Gala Romney

## 2019-04-09 NOTE — Telephone Encounter (Signed)
Ok to proceed.   Can you please send her an emaal with my approval.

## 2019-05-17 ENCOUNTER — Encounter (HOSPITAL_COMMUNITY): Payer: Medicaid - Out of State | Admitting: Internal Medicine

## 2019-05-17 ENCOUNTER — Ambulatory Visit (HOSPITAL_COMMUNITY): Admission: RE | Admit: 2019-05-17 | Payer: Self-pay | Source: Ambulatory Visit

## 2019-05-21 DIAGNOSIS — Z86711 Personal history of pulmonary embolism: Secondary | ICD-10-CM | POA: Insufficient documentation

## 2019-07-27 DIAGNOSIS — N183 Chronic kidney disease, stage 3 unspecified: Secondary | ICD-10-CM | POA: Insufficient documentation

## 2019-10-12 ENCOUNTER — Encounter: Payer: Self-pay | Admitting: Cardiovascular Disease

## 2019-10-12 ENCOUNTER — Telehealth: Payer: Self-pay | Admitting: Cardiovascular Disease

## 2019-10-15 ENCOUNTER — Ambulatory Visit
Admission: RE | Admit: 2019-10-15 | Discharge: 2019-10-15 | Disposition: A | Discharge status: Home / Self Care | Payer: Self-pay | Source: Ambulatory Visit

## 2019-10-15 ENCOUNTER — Other Ambulatory Visit: Payer: Self-pay

## 2019-10-20 ENCOUNTER — Encounter: Payer: Self-pay | Admitting: Cardiovascular Disease

## 2019-10-20 ENCOUNTER — Ambulatory Visit: Payer: 59 | Attending: Cardiovascular Disease | Admitting: Cardiovascular Disease

## 2019-10-20 VITALS — BP 116/76 | HR 102 | Temp 98.1°F | Wt 154.5 lb

## 2019-10-20 DIAGNOSIS — I5022 Chronic systolic (congestive) heart failure: Secondary | ICD-10-CM | POA: Insufficient documentation

## 2019-10-20 DIAGNOSIS — Z86711 Personal history of pulmonary embolism: Secondary | ICD-10-CM | POA: Insufficient documentation

## 2019-10-20 DIAGNOSIS — I428 Other cardiomyopathies: Secondary | ICD-10-CM | POA: Insufficient documentation

## 2019-10-20 DIAGNOSIS — N189 Chronic kidney disease, unspecified: Secondary | ICD-10-CM | POA: Insufficient documentation

## 2019-10-20 DIAGNOSIS — L732 Hidradenitis suppurativa: Secondary | ICD-10-CM | POA: Insufficient documentation

## 2019-10-20 NOTE — Patient Instructions (Addendum)
Changes for Today:  1. Dr. Kyung Rudd discussed your infection today and would like you to continue to follow up with Dermatology.   2. If your skin infection improves and you are thinking about advanced therapies please let us know.   3. We will swab your nose for MRSA today and call you with the results.   4. Continue to remain smoke free.   5. No changes to medications.   6.  Video visit in early October or November 2021 with Dr. Kyung Rudd for a heart failure appointment. ZOXWRUE our scheduler will call you to schedule this appointment.     Living with Heart Failure Information:  1. Follow a sodium restricted diet. Eat no more than 2000 milligrams of sodium per day.  Read labels of packaged foods if you are not familiar with the sodium content per serving.   2. Follow a 64 ounce fluid restriction. This is approximately equal to 2 liters, or 2000 cc's of fluid. Count all of the sources of fluid in your diet: drinks, fruits, vegetables, soups, etc.  3. Weigh yourself daily.  4. Please weigh yourself daily at the same time each day. Report to our nurses a change in your weight of 3 pounds or more in one day, or 5 pounds or more in one week.  Call if you experience a significant change in your heart failure symptoms.  5. Anticipate refills for your medications.   6. Maintain regular exercise. Try to exercise at a level that is comfortable for you for 30 minutes, 4 to 7 times per week.   7. Remain up to date with your vaccinations. Obtain a flu shot annually during flu season. Obtain a pneumococcal vaccination at least once prior to the age of 62, and repeat this again once you pass the age of 87 if it has been 5 years since your last vaccination.  8. Keep track of your appointments with our clinic and with your other doctors. Please call us if you think that you will miss your appointment with [place].    For clinical question please call one of the nurses below:    - Cathren Harsh, RN: [phone]   - Tessa Lerner, RN:  [phone]   - Purcell Nails, RN: [phone]   - Ralph Dowdy, RN: [phone]   - Johnnette Litter, RN: [phone]     Other helpful phone numbers:   - Front Desk: [phone]   - Shakita Wingate, Admin/Scheduler: [phone]   - Dola Factor, Outreach Admin/Scheduler: [phone]   - Jerry Caras, Social Worker: [phone]; [email or web address] .Sena Hitch, Nutritionist: [phone]   - For medication refills, voicemail line: [phone], option #1 then option #3   - Fax number: [phone]

## 2019-10-20 NOTE — Progress Notes (Signed)
Dear Drs. Bensimhon, Zagol, Bergin, [place], and Zlotoff:    I had the pleasure of seeing [name] [name] today in the Cardiology Clinic in consultation at Dr. Martin Majestic request for consideration of advanced heart failure therapies.  As you know, [name] [name] is a very pleasant 44 y.o. 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  He was initially diagnosed with heart failure in 2018, an LV thrombus was present at diagnosis but has since resolved.  He was followed by Dr. Gala Romney at [facility], last seen in January, as well as Dr. Teofilo Pod in [place].  Cardiac function improved with medical management to LVEF 35-40% in November 2020.  He was hospitalized in December 2020 at [facility] with severe anemia concerning for GI bleed, though EGD and colonoscopy did not reveal a source.  Since then unfortunately he has had worsening heart failure and progressive renal insufficiency.  He had a prolonged hospitalization at [facility] last month for heart failure, he was discharged home on milrinone infusion 0.25 mcg/kg/min.  He was considered for advanced heart failure therapies but declined due to the severity of his hidradenitis suppurativa.  He presents today for a second opinion on candidacy for advanced therapies.      [name] [name] has felt better from the cardiac perspective since discharge.  His appetite has been good and he's not noticed any fluid retention, previously he's had trouble with abdominal distention.  He's not had chest pain, orthopnea, PND, palpitations, lightheadedness, or syncope.  He's not had any problems with the milrinone or PICC line.      He was diagnosed with hidradenitis suppurativa 20 years ago and has undergone multiple excisions with plastic surgery.  Humira was recommended in November 2020 but deferred due to worsening heart disease.  He ultimately started stelara in May and last dose was about 6 weeks ago.   Infliximab has also been considered.  He was hospitalized in May at [facility] for a left gluteal abscess requiring percutaneous drainage and IV antibiotics.  Drs. Zlotoff and Mannschreck describe it as the worst case they've seen in their careers.  [name] [name] has noticed some improvement with stelara but his disease remains quite severe, much worse than it's been in the past.  He has a lot of pain from the skin lesions as well as bloody and purulent drainage.  His buttocks, hips, and lower abdomen are affected.  He's not had bacteremia to his knowledge.  Fortunately he's not had any trouble with the PICC line.      Past Medical History:  1.  Heart failure due to nonischemic cardiomyopathy, as above.  2.  LV thrombus.  3.  Pulmonary embolism, December 2020.  4.  Chronic kidney disease.  5.  Hidradenitis suppurativa, as above.  6.  Iron deficiency anemia.    Social and Family History:  Lives in [place], [place] with his mother.  Has an older brother and an adult son, a second son passed away.  No heart disease in the family.  He quit smoking July 5th, he smoked for 20 years but was down to 1-2 cigarettes/day prior to quitting.  He has a history of alcohol use as well but quit several years ago.  He denies illicit drug use.      Allergies   Allergen Reactions    Isosorb Dinitrate-Hydralazine Other (See Comments)     Severe Headache  Severe Headache      Sulfamethoxazole-Trimethoprim Itching and Rash  Other reaction(s): Hives  Other reaction(s): Hives      Bacitracin Rash     Current Outpatient Medications on File Prior to Visit   Medication Sig Dispense Refill    clindamycin (CLEOCIN) 300 MG capsule Take 600 mg by mouth 2 (two) times daily      ferrous sulfate 325 (65 FE) MG EC tablet Take 325 mg by mouth every other day      furosemide (LASIX) 40 MG tablet Take 40 mg by mouth daily      hydrALAZINE (APRESOLINE) 50 MG tablet Take 50 mg by mouth 3 (three) times daily      hydrOXYzine (ATARAX) 25 MG tablet Take 12.5  mg by mouth 3 (three) times daily as needed      isosorbide dinitrate (ISORDIL) 30 MG tablet Take 30 mg by mouth 3 (three) times daily      magnesium oxide (MAG-OX) 400 MG tablet Take 800 mg by mouth 2 (two) times daily      methadone (DOLOPHINE) 5 MG tablet Take 2.5 mg by mouth 3 (three) times daily      metoprolol succinate XL (TOPROL-XL) 25 MG 24 hr tablet Take 25 mg by mouth daily      Milrinone Lactate in Dextrose 40-5 MG/200ML-% Solution Infuse 0.25 mcg/kg/min into the vein continuous      mirtazapine (REMERON) 7.5 MG tablet Take 7.5 mg by mouth nightly      Multiple Vitamins-Minerals (multivitamin with minerals) tablet Take 1 tablet by mouth daily      nystatin (NYSTOP) powder Apply 1 application topically as needed      polyethylene glycol (MIRALAX) 17 GM/SCOOP powder Take 17 g by mouth as needed      Wound Cleansers (Vashe Cleansing) Solution Apply 1 applicator topically as needed      busPIRone (BUSPAR) 5 MG tablet Take 5 mg by mouth 2 (two) times daily      naloxone (Narcan) 4 MG/0.1ML nasal spray 1 spray by Nasal route as needed      Thiamine HCl (VITAMIN B-1 PO) Take 100 mg by mouth daily      triamcinolone (KENALOG) 0.1 % ointment Apply 1 application topically as needed      Veltassa 8.4 g Pack Take 1 packet by mouth daily       No current facility-administered medications on file prior to visit.     Review of Systems:  Complete 14 point review of systems was performed.  It is significant for the findings mentioned in the HPI.  All other systems are negative.    Physical Exam:  BP 116/76 (BP Site: Right arm, Patient Position: Sitting, Cuff Size: Medium)    Pulse (!) 102    Temp 98.1 F (36.7 C) (Oral)    Wt 70.1 kg (154 lb 8 oz)    SpO2 100%    Ht 6' BMI 20.9  Wt Readings from Last 3 Encounters:   10/20/19 70.1 kg (154 lb 8 oz)   03/18/19 159 lb  In general, [name] [name] is a well-developed, well-nourished African American gentleman appearing stated age.  He is alert and oriented times  three and appears uncomfortable, he's tearful at times.  There is no scleral icterus.  There is minimal jugular venous distention.  No carotid bruits.  The carotid upstrokes are normal.  The heart has a regular rhythm with normal S1 and S2.  There is a II/VI holosystolic murmur at the apex.  The lungs are clear to auscultation bilaterally.  The  abdomen is soft, nontender, and nondistended.  Bowel sounds are present.  There is no hepatosplenomegaly or ascites.  The extremities are warm and well-perfused.  There is no clubbing, cyanosis, or edema.  The left upper extremity PICC line has no erythema, tenderness, or drainage.  Radial and pedal pulses are 2+ bilaterally.  On skin exam, there are several tattoos.  There are hidradenitis lesions on his lower abdomen, buttocks, and hips with scarring and sinus tracts.  His face has scarring from cystic acne.    Right heart catheterization 09/15/19 on dobutamine RA 16, PA 58/26/38, PCWP 30, TPG 30, CO/CI/PVR Fick 4.3/2.3/1.9, Thermal 4.9/2.65/1.6.    Right heart catheterization 09/22/19 off inotropes RA 23, PA 55/41/46, PCWP 33, TPG 13, CO/CI/PVR Fick 2.7/1.5/4.8, thermal 3.1/1.7/4.2.    TTE 09/21/19, I was not able to review  1. Severely reduced left ventricular systolic function. Estimated left ventricular ejection fraction 15 %   2. Moderately reduced right ventricular systolic function.   3. Mild mitral regurgitation.   4. Moderate tricuspid regurgitation.   LVIDd 6.8 cm, TAPSE 0.76 cm, PASP 53 mmHg    Coronary angiography 05/16/16 normal coronary arteries.      Urine protein/creatinine ratio 428 08/10/19.    Labs 10/12/19 WBC 11.8, Hgb 11.6, plt 418, creatinine 2.97, sodium 131, potassium 5.1, total bilirubin 0.7, BUN 54, albumin 3.3, AST 28, ALT 16.     Mr. Goracke is a very pleasant 44 y.o. 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  He's euvolemic on exam today with NYHA  class III symptoms, on milrinone.      We talked about heart transplant and left ventricular assist devices as durable treatments for progressive heart failure not responsive to medical management.  Patients undergoing transplant or LVAD implant are expected to survive less than a year without intervention.  In order to be a candidate for transplant or LVAD, we expect the patient to have an expected survival of at least 5 years from their comorbid conditions.      We discussed the risk of immunosuppression following transplantation, which increases the risk of infection and malignancy, and can cause worsening renal function over time.  Given the severity of Mr. Hyson' kidney disease, we would need to consider simultaneous heart-kidney transplant.      We discussed LVAD in general terms, including the driveline and need for continuous power supply.      Mr. Shavers' skin disease is a significant concern as we consider LVAD and transplant.  There is not much literature on transplant in patients with hidradenitis suppurativa, I found a case report describing improvement in skin disease following kidney transplant.  I'll need to discuss with the rest of our group to get their thoughts, and with Dr. Sondra Barges as well.      Mr. Coffin was so uncomfortable during our visit he did not engage in the conversation much.  He's hopeful that he can get his skin disease under better control with stelara.  I suggested a follow-up telehealth visit in a couple months for further discussion.      Thank you very much for allowing me to assist in the care of this delightful patient.  I have asked Mr. Marszalek to schedule a telehealth visit in a couple months.  Of course, I would be happy to see Mr. Hinderliter sooner should the need arise.  Please don't hesitate to contact me if you have any questions  or concerns.    Sincerely,    Alba Destine MD  Advanced Heart Failure and Transplant Cardiology    I spent a total of 65 minutes on  this patient's care on the day of their visit, excluding the time spent related to any billed procedures.  This time includes face-to-face time with the patient as well as time spent reviewing records, independently interpreting studies, communicating with other health care professionals, coordinating care, and documenting in the medical record.      CC: No primary care provider on file.  Daniel Bensimhon  Gomez Cleverly Bergin  Romin Bonakdar  Barrett Zlotoff

## 2019-10-21 ENCOUNTER — Telehealth: Payer: Self-pay

## 2019-10-21 LAB — MRSA CULTURE: Culture MRSA Surveillance: POSITIVE — AB

## 2019-10-21 NOTE — Telephone Encounter (Signed)
I was given report from Ahmed Prima RN the central lab paged with the patient's MRSA culture and it was positive for MRSA. The patient stated understanding, he asked if it could be found in other parts of his body and I encouraged him to discuss this with his Dermatologist and he stated understanding.     Dr. Kyung Rudd spoke to our team about opening an LVAD and heart transplant evaluation, I also discussed this information with the patient and he wishes to move forward. I let him know an admin would reach out to him next week to discuss scheduling his testing, consults and education, will continue with plan of care.

## 2019-11-09 ENCOUNTER — Telehealth: Payer: 59 | Admitting: Cardiovascular Disease

## 2019-11-11 DIAGNOSIS — I509 Heart failure, unspecified: Secondary | ICD-10-CM | POA: Insufficient documentation

## 2019-11-12 ENCOUNTER — Inpatient Hospital Stay
Admission: AD | Admit: 2019-11-12 | Discharge: 2019-11-19 | DRG: 721 | Disposition: A | Discharge status: Home / Self Care | Payer: 59 | Source: Other Acute Inpatient Hospital | Attending: Internal Medicine | Admitting: Internal Medicine

## 2019-11-12 DIAGNOSIS — E871 Hypo-osmolality and hyponatremia: Secondary | ICD-10-CM | POA: Diagnosis not present

## 2019-11-12 DIAGNOSIS — Z20822 Contact with and (suspected) exposure to covid-19: Secondary | ICD-10-CM | POA: Diagnosis present

## 2019-11-12 DIAGNOSIS — T80211A Bloodstream infection due to central venous catheter, initial encounter: Principal | ICD-10-CM | POA: Diagnosis present

## 2019-11-12 DIAGNOSIS — E872 Acidosis: Secondary | ICD-10-CM | POA: Diagnosis present

## 2019-11-12 DIAGNOSIS — I5023 Acute on chronic systolic (congestive) heart failure: Secondary | ICD-10-CM | POA: Diagnosis present

## 2019-11-12 DIAGNOSIS — L732 Hidradenitis suppurativa: Secondary | ICD-10-CM | POA: Diagnosis present

## 2019-11-12 DIAGNOSIS — A4102 Sepsis due to Methicillin resistant Staphylococcus aureus: Secondary | ICD-10-CM | POA: Diagnosis present

## 2019-11-12 DIAGNOSIS — D638 Anemia in other chronic diseases classified elsewhere: Secondary | ICD-10-CM | POA: Diagnosis present

## 2019-11-12 DIAGNOSIS — N179 Acute kidney failure, unspecified: Secondary | ICD-10-CM | POA: Diagnosis present

## 2019-11-12 DIAGNOSIS — N183 Chronic kidney disease, stage 3 unspecified: Secondary | ICD-10-CM | POA: Diagnosis present

## 2019-11-12 DIAGNOSIS — I428 Other cardiomyopathies: Secondary | ICD-10-CM | POA: Diagnosis present

## 2019-11-12 DIAGNOSIS — Z87891 Personal history of nicotine dependence: Secondary | ICD-10-CM

## 2019-11-12 DIAGNOSIS — I472 Ventricular tachycardia: Secondary | ICD-10-CM | POA: Diagnosis not present

## 2019-11-12 DIAGNOSIS — E875 Hyperkalemia: Secondary | ICD-10-CM | POA: Diagnosis not present

## 2019-11-12 DIAGNOSIS — I429 Cardiomyopathy, unspecified: Secondary | ICD-10-CM

## 2019-11-12 DIAGNOSIS — R7881 Bacteremia: Secondary | ICD-10-CM

## 2019-11-12 DIAGNOSIS — I509 Heart failure, unspecified: Secondary | ICD-10-CM | POA: Insufficient documentation

## 2019-11-12 DIAGNOSIS — I82613 Acute embolism and thrombosis of superficial veins of upper extremity, bilateral: Secondary | ICD-10-CM | POA: Diagnosis present

## 2019-11-12 DIAGNOSIS — R519 Headache, unspecified: Secondary | ICD-10-CM | POA: Diagnosis present

## 2019-11-12 DIAGNOSIS — Z86711 Personal history of pulmonary embolism: Secondary | ICD-10-CM

## 2019-11-12 DIAGNOSIS — B9562 Methicillin resistant Staphylococcus aureus infection as the cause of diseases classified elsewhere: Secondary | ICD-10-CM

## 2019-11-12 DIAGNOSIS — Z7901 Long term (current) use of anticoagulants: Secondary | ICD-10-CM

## 2019-11-12 DIAGNOSIS — Z79899 Other long term (current) drug therapy: Secondary | ICD-10-CM

## 2019-11-12 LAB — COMPREHENSIVE METABOLIC PANEL
ALT: 29 U/L (ref 0–55)
AST (SGOT): 41 U/L — ABNORMAL HIGH (ref 5–34)
Albumin/Globulin Ratio: 0.3 — ABNORMAL LOW (ref 0.9–2.2)
Albumin: 2 g/dL — ABNORMAL LOW (ref 3.5–5.0)
Alkaline Phosphatase: 182 U/L — ABNORMAL HIGH (ref 38–106)
Anion Gap: 9 (ref 5.0–15.0)
BUN: 54 mg/dL — ABNORMAL HIGH (ref 9.0–28.0)
Bilirubin, Total: 2.3 mg/dL — ABNORMAL HIGH (ref 0.2–1.2)
CO2: 15 mEq/L — ABNORMAL LOW (ref 22–29)
Calcium: 7.8 mg/dL — ABNORMAL LOW (ref 8.5–10.5)
Chloride: 109 mEq/L (ref 100–111)
Creatinine: 2.4 mg/dL — ABNORMAL HIGH (ref 0.7–1.3)
Globulin: 5.8 g/dL — ABNORMAL HIGH (ref 2.0–3.6)
Glucose: 96 mg/dL (ref 70–100)
Potassium: 3.9 mEq/L (ref 3.5–5.1)
Protein, Total: 7.8 g/dL (ref 6.0–8.3)
Sodium: 133 mEq/L — ABNORMAL LOW (ref 136–145)

## 2019-11-12 LAB — CBC AND DIFFERENTIAL
Absolute NRBC: 0 10*3/uL (ref 0.00–0.00)
Basophils Absolute Automated: 0.02 10*3/uL (ref 0.00–0.08)
Basophils Automated: 0.2 %
Eosinophils Absolute Automated: 0.44 10*3/uL (ref 0.00–0.44)
Eosinophils Automated: 4.9 %
Hematocrit: 27.1 % — ABNORMAL LOW (ref 37.6–49.6)
Hgb: 8.4 g/dL — ABNORMAL LOW (ref 12.5–17.1)
Immature Granulocytes Absolute: 0.05 10*3/uL (ref 0.00–0.07)
Immature Granulocytes: 0.6 %
Lymphocytes Absolute Automated: 0.92 10*3/uL (ref 0.42–3.22)
Lymphocytes Automated: 10.2 %
MCH: 26.4 pg (ref 25.1–33.5)
MCHC: 31 g/dL — ABNORMAL LOW (ref 31.5–35.8)
MCV: 85.2 fL (ref 78.0–96.0)
MPV: 10.1 fL (ref 8.9–12.5)
Monocytes Absolute Automated: 0.34 10*3/uL (ref 0.21–0.85)
Monocytes: 3.8 %
Neutrophils Absolute: 7.28 10*3/uL — ABNORMAL HIGH (ref 1.10–6.33)
Neutrophils: 80.3 %
Nucleated RBC: 0 /100 WBC (ref 0.0–0.0)
Platelets: 125 10*3/uL — ABNORMAL LOW (ref 142–346)
RBC: 3.18 10*6/uL — ABNORMAL LOW (ref 4.20–5.90)
RDW: 21 % — ABNORMAL HIGH (ref 11–15)
WBC: 9.05 10*3/uL (ref 3.10–9.50)

## 2019-11-12 LAB — URINALYSIS REFLEX TO MICROSCOPIC EXAM - REFLEX TO CULTURE
Bilirubin, UA: NEGATIVE
Glucose, UA: NEGATIVE
Ketones UA: NEGATIVE
Nitrite, UA: NEGATIVE
Protein, UR: 100 — AB
Specific Gravity UA: 1.015 (ref 1.001–1.035)
Urine pH: 6 (ref 5.0–8.0)
Urobilinogen, UA: 4 mg/dL (ref 0.2–2.0)

## 2019-11-12 LAB — LACTIC ACID, PLASMA: Lactic Acid: 1.4 mmol/L (ref 0.2–2.0)

## 2019-11-12 LAB — CELL MORPHOLOGY
Cell Morphology: NORMAL
Platelet Estimate: DECREASED — AB

## 2019-11-12 LAB — PT AND APTT
PT INR: 1.4 — ABNORMAL HIGH (ref 0.9–1.1)
PT: 16.4 s — ABNORMAL HIGH (ref 10.1–12.9)
PTT: 30 s (ref 27–39)

## 2019-11-12 LAB — MAGNESIUM: Magnesium: 1.7 mg/dL (ref 1.6–2.6)

## 2019-11-12 LAB — MIXED VENOUS BLOOD GASES
Base Excess, Ven: -9.2 mEq/L
HCO3, Ven: 14.9 mEq/L
O2 Sat, Venous: 58.8 %
Temperature: 37
Venous Total CO2: 15.8 mEq/L
pCO2, Venous: 27.3 mmHg
pH, Ven: 7.356
pO2, Venous: 34.3 mmHg

## 2019-11-12 LAB — DIGOXIN LEVEL: Digoxin Level: 0.4 ng/mL — ABNORMAL LOW (ref 0.5–2.0)

## 2019-11-12 LAB — TROPONIN I: Troponin I: 0.41 ng/mL (ref 0.00–0.05)

## 2019-11-12 LAB — GFR: EGFR: 36.1

## 2019-11-12 LAB — PHOSPHORUS: Phosphorus: 2.6 mg/dL (ref 2.3–4.7)

## 2019-11-12 MED ORDER — POTASSIUM CHLORIDE 20 MEQ PO PACK
40.0000 meq | PACK | Freq: Once | ORAL | Status: AC
Start: 2019-11-12 — End: 2019-11-12
  Administered 2019-11-12: 21:00:00 40 meq via ORAL
  Filled 2019-11-12: qty 2

## 2019-11-12 MED ORDER — SODIUM CHLORIDE 0.9 % IV MBP
2.0000 g | Freq: Three times a day (TID) | INTRAVENOUS | Status: DC
Start: 2019-11-12 — End: 2019-11-12
  Filled 2019-11-12 (×2): qty 2

## 2019-11-12 MED ORDER — ISOSORBIDE DINITRATE 10 MG PO TABS
30.0000 mg | ORAL_TABLET | Freq: Three times a day (TID) | ORAL | Status: DC
Start: 2019-11-12 — End: 2019-11-19
  Administered 2019-11-12 – 2019-11-16 (×12): 30 mg via ORAL
  Administered 2019-11-16: 20 mg via ORAL
  Administered 2019-11-17 – 2019-11-19 (×8): 30 mg via ORAL
  Filled 2019-11-12 (×2): qty 3
  Filled 2019-11-12: qty 2
  Filled 2019-11-12 (×7): qty 3
  Filled 2019-11-12: qty 1
  Filled 2019-11-12 (×8): qty 3
  Filled 2019-11-12: qty 2
  Filled 2019-11-12 (×2): qty 3

## 2019-11-12 MED ORDER — POTASSIUM CHLORIDE 10 MEQ/100ML IV SOLN (WRAP)
10.0000 meq | Freq: Once | INTRAVENOUS | Status: DC
Start: 2019-11-12 — End: 2019-11-12

## 2019-11-12 MED ORDER — METOPROLOL SUCCINATE ER 25 MG PO TB24
25.0000 mg | ORAL_TABLET | Freq: Every day | ORAL | Status: DC
Start: 2019-11-13 — End: 2019-11-17
  Administered 2019-11-13 – 2019-11-17 (×5): 25 mg via ORAL
  Filled 2019-11-12 (×5): qty 1

## 2019-11-12 MED ORDER — PREDNISONE 5 MG PO TABS
30.0000 mg | ORAL_TABLET | Freq: Every morning | ORAL | Status: DC
Start: 2019-11-13 — End: 2019-11-19
  Administered 2019-11-13 – 2019-11-19 (×7): 30 mg via ORAL
  Filled 2019-11-12 (×8): qty 2

## 2019-11-12 MED ORDER — OXYCODONE HCL 5 MG PO TABS
5.0000 mg | ORAL_TABLET | Freq: Four times a day (QID) | ORAL | Status: DC | PRN
Start: 2019-11-12 — End: 2019-11-15
  Administered 2019-11-12 – 2019-11-15 (×6): 5 mg via ORAL
  Filled 2019-11-12 (×6): qty 1

## 2019-11-12 MED ORDER — PREDNISONE 5 MG PO TABS
30.0000 mg | ORAL_TABLET | Freq: Every morning | ORAL | Status: DC
Start: 2019-11-12 — End: 2019-11-12

## 2019-11-12 MED ORDER — HYDRALAZINE HCL 50 MG PO TABS
50.0000 mg | ORAL_TABLET | Freq: Three times a day (TID) | ORAL | Status: DC
Start: 2019-11-12 — End: 2019-11-17
  Administered 2019-11-12 – 2019-11-17 (×14): 50 mg via ORAL
  Filled 2019-11-12 (×3): qty 1
  Filled 2019-11-12: qty 2
  Filled 2019-11-12 (×2): qty 1
  Filled 2019-11-12: qty 2
  Filled 2019-11-12 (×7): qty 1

## 2019-11-12 MED ORDER — ACETAMINOPHEN 325 MG PO TABS
650.0000 mg | ORAL_TABLET | Freq: Four times a day (QID) | ORAL | Status: DC | PRN
Start: 2019-11-12 — End: 2019-11-19
  Administered 2019-11-12 – 2019-11-19 (×5): 650 mg via ORAL
  Filled 2019-11-12 (×5): qty 2

## 2019-11-12 MED ORDER — FUROSEMIDE 40 MG PO TABS
40.0000 mg | ORAL_TABLET | Freq: Every day | ORAL | Status: DC
Start: 2019-11-13 — End: 2019-11-19
  Administered 2019-11-13 – 2019-11-19 (×7): 40 mg via ORAL
  Filled 2019-11-12 (×7): qty 1

## 2019-11-12 MED ORDER — FERROUS SULFATE 324 (65 FE) MG PO TBEC
324.0000 mg | DELAYED_RELEASE_TABLET | ORAL | Status: DC
Start: 2019-11-13 — End: 2019-11-19
  Administered 2019-11-13 – 2019-11-19 (×4): 324 mg via ORAL
  Filled 2019-11-12 (×4): qty 1

## 2019-11-12 MED ORDER — HEPARIN SODIUM (PORCINE) 5000 UNIT/ML IJ SOLN
5000.0000 [IU] | Freq: Three times a day (TID) | INTRAMUSCULAR | Status: DC
Start: 2019-11-12 — End: 2019-11-14
  Filled 2019-11-12 (×3): qty 1

## 2019-11-12 MED ORDER — MAGNESIUM SULFATE IN D5W 1-5 GM/100ML-% IV SOLN
1.0000 g | Freq: Once | INTRAVENOUS | Status: AC
Start: 2019-11-12 — End: 2019-11-12
  Administered 2019-11-12: 21:00:00 1 g via INTRAVENOUS
  Filled 2019-11-12: qty 100

## 2019-11-12 MED ORDER — SODIUM CHLORIDE 0.9 % IV MBP
400.0000 mg | Freq: Two times a day (BID) | INTRAVENOUS | Status: DC
Start: 2019-11-12 — End: 2019-11-15
  Administered 2019-11-12 – 2019-11-15 (×6): 400 mg via INTRAVENOUS
  Filled 2019-11-12 (×8): qty 400

## 2019-11-12 MED ORDER — MILRINONE LACTATE IN DEXTROSE 20-5 MG/100ML-% IV SOLN
0.2500 ug/kg/min | INTRAVENOUS | Status: DC
Start: 2019-11-12 — End: 2019-11-19
  Administered 2019-11-12 – 2019-11-18 (×12): 0.25 ug/kg/min via INTRAVENOUS
  Filled 2019-11-12 (×9): qty 100

## 2019-11-12 NOTE — Plan of Care (Signed)
Admitted pt from ems/[place].  Pt received on milrinone, attempted to mutliple sites to [place] PICC.  Unable to to get two sites.  Plan to have IV team insert Midline 9/11.  Labs collected per orders.

## 2019-11-12 NOTE — Plan of Care (Signed)
CICU Plan of Care    Notified of patient admission to CICU.    44yo male PMH NICM (EF 15-20%), on home milrinone, Hx of LV thrombus (2018; resolved) and PE, hidradenitis suppurativa (on prednisone at home), CKD who presents as a transfer from OSH for further management of MRSA bacteremia in the setting of cardiomyopathy.    History obtained from patient and chart review. He presented 9/7 to OSH (in [place], [place]) with myalgia, fever, diarrhea, SOB, exertional CP. Was found to be febrile, tachycardic HR 134, leukocytosis 28, elevated procalcitonin, with subsequent BCx growing MRSA. Started on Ceftazidime and Vancomycin, with improvement in symptoms and leukocytosis. Has PICC in place for home milrinone. Currently endorsing pain at PICC site. Transferred here for further management as he is currently undergoing transplant evaluation here.    EXAM:  BP 129/78    Pulse (!) 119    Temp 97.8 F (36.6 C) (Oral)    Resp (!) 23    Ht 1.854 m (6\' 1" )    Wt 73.1 kg (161 lb 2.5 oz)    SpO2 99%    BMI 21.26 kg/m     GEN: awake, alert, oriented x3, resting in bed NAD  CV: tachycardic, regular rhythm, nl S1, S2, no murmurs, rubs, gallops  PULM: CTAB, no rales, ronchi, wheezing  ABD: soft, nontender, nondistended  EXT: warm, well perfused, no LE edema    IMPRESSION:   MRSA Bacteremia, possible PICC line infection   Nonischemic cardiomyopathy - on home Milrione   TTE 7/20: EF 15%, moderately reduced RV function   Suspect his presentation is principally due to infection rather than cardiogenic. He is otherwise hemodynamically stable, with adequate BPs, though he continues with sinus tachycardia.    Hidratenitis Suppurativa   AKI on CKD (unknown baseline)   History of PE (02/2019)   History of GIB concern (01/2019)   Fe-deficiency anemia    PLAN:   Continue antibiotic coverage with Ceftaroline for now   Continue Milrione at 0.43mcg/kg/min   TTE ordered in setting of bacteremia   Continue home prednisone   Continue home  metoprolol, Hydralazine-Isosorbide, Furosemide    I evaluated patient at bedside. Plan discussed with Dr. Adela Lank, patient, RN.    [name], MD, cardiology fellow

## 2019-11-12 NOTE — H&P (Signed)
Patient's Name: [name] [name]   Attending Provider: Casilda Carls, MD  Admit Date:11/12/2019  Medical Record Number: [id number]   Room:  FI118/FI118-01  Date/Time: 11/12/19 5:18 PM    Chief complaint:  Dyspnea, weakness     Hospitalist:     Cardiologist: Dr. Kyung Rudd, Advanced Heart Failure & Transplant     History of present illness:    [name] is a 44 yo M with hx of NICM (EF 15-20% JUL2021), HS (on prednisone), CKD presenting to [facility] with worsening myalgias, weakness, and dyspnea over the past week. He previously was seen at [facility] and determined not to be transplant or LVAD candidate given recurrent skin infections 2/2 HS. He was sent home on milrinone with outpatient PICC line placement. He says he has been feeling well on his prednisone and felt the best he has in a while. Sunday he started feeling tired with myalgias, fevers, diarrhea, SOB, and chest pain with exertion. He continued to feel worse so presented to the [facility] ED on Tuesday. He was febrile to 100.105F, tachycardic to 134, WBC of 28, and procal of 93. He had MRSA bacteremia and was started on ceftazidime and vanc. He was loaded with digoxin (0.25mg  08SEP, 0.125mg  x2 09SEP). Given he remained tachycardic and is currently undergoing heart transplant eval at [facility] with Dr. Kyung Rudd, he was transferred to [facility] on 10SEP.     On arrival he says he still has myalgias which he feels have not improved since presenting to [place]. Still feels feverish and endorses diarrhea, dyspnea, and chest pain with exertion. He has pain at his left PICC insertion site. Denies any LE edema or orthopnea.     Past Medical History:    No past medical history on file.   NICM (EF 15-20% JUL2021)  HS (on prednisone)  CKD3  Chronic anemia     Past Surgical History:    No past surgical history on file.    Family History:  Family History   Problem Relation Age of Onset    Hypertension Brother        Home medications:      Prior to Admission medications    Medication Sig Start  Date End Date Taking? Authorizing Provider   busPIRone (BUSPAR) 5 MG tablet Take 5 mg by mouth 2 (two) times daily 10/06/19   [provider]   ferrous sulfate 325 (65 FE) MG EC tablet Take 325 mg by mouth every other day 02/19/19   [provider]   furosemide (LASIX) 40 MG tablet Take 40 mg by mouth daily 02/19/19   [provider]   hydrALAZINE (APRESOLINE) 50 MG tablet Take 50 mg by mouth 3 (three) times daily 10/06/19   [provider]   hydrOXYzine (ATARAX) 25 MG tablet Take 12.5 mg by mouth 3 (three) times daily as needed 10/06/19   [provider]   isosorbide dinitrate (ISORDIL) 30 MG tablet Take 30 mg by mouth 3 (three) times daily 10/06/19   [provider]   magnesium oxide (MAG-OX) 400 MG tablet Take 800 mg by mouth 2 (two) times daily 10/06/19   [provider]   metoprolol succinate XL (TOPROL-XL) 25 MG 24 hr tablet Take 25 mg by mouth daily 10/06/19   [provider]   Milrinone Lactate in Dextrose 40-5 MG/200ML-% Solution Infuse 0.25 mcg/kg/min into the vein continuous 10/05/19   [provider]   mirtazapine (REMERON) 7.5 MG tablet Take 7.5 mg by mouth nightly 10/06/19   [provider]   Multiple Vitamins-Minerals (multivitamin with minerals) tablet Take 1 tablet by mouth daily 10/07/19   [provider]   naloxone (Narcan) 4 MG/0.1ML nasal spray 1 spray by Nasal route as needed    [provider]   nystatin (NYSTOP) powder Apply 1 application topically as needed 10/06/19   [provider]   polyethylene glycol (MIRALAX) 17 GM/SCOOP powder Take 17 g by mouth as needed 10/07/19   [provider]   Thiamine HCl (VITAMIN B-1 PO) Take 100 mg by mouth daily 04/24/19   [provider]   triamcinolone (KENALOG) 0.1 % ointment Apply 1 application topically as needed 09/03/19   [provider]   Veltassa 8.4 g Pack Take 1 packet by mouth daily 10/06/19   [provider]   Wound  Cleansers (Vashe Cleansing) Solution Apply 1 applicator topically as needed 09/03/19   [provider]        Inpatient/ER medications:  Medications Prior to Admission   Medication Sig    busPIRone (BUSPAR) 5 MG tablet Take 5 mg by mouth 2 (two) times daily    ferrous sulfate 325 (65 FE) MG EC tablet Take 325 mg by mouth every other day    furosemide (LASIX) 40 MG tablet Take 40 mg by mouth daily    hydrALAZINE (APRESOLINE) 50 MG tablet Take 50 mg by mouth 3 (three) times daily    hydrOXYzine (ATARAX) 25 MG tablet Take 12.5 mg by mouth 3 (three) times daily as needed    isosorbide dinitrate (ISORDIL) 30 MG tablet Take 30 mg by mouth 3 (three) times daily    magnesium oxide (MAG-OX) 400 MG tablet Take 800 mg by mouth 2 (two) times daily    metoprolol succinate XL (TOPROL-XL) 25 MG 24 hr tablet Take 25 mg by mouth daily    Milrinone Lactate in Dextrose 40-5 MG/200ML-% Solution Infuse 0.25 mcg/kg/min into the vein continuous    mirtazapine (REMERON) 7.5 MG tablet Take 7.5 mg by mouth nightly    Multiple Vitamins-Minerals (multivitamin with minerals) tablet Take 1 tablet by mouth daily    naloxone (Narcan) 4 MG/0.1ML nasal spray 1 spray by Nasal route as needed    nystatin (NYSTOP) powder Apply 1 application topically as needed    polyethylene glycol (MIRALAX) 17 GM/SCOOP powder Take 17 g by mouth as needed    Thiamine HCl (VITAMIN B-1 PO) Take 100 mg by mouth daily    triamcinolone (KENALOG) 0.1 % ointment Apply 1 application topically as needed    Veltassa 8.4 g Pack Take 1 packet by mouth daily    Wound Cleansers (Vashe Cleansing) Solution Apply 1 applicator topically as needed       Allergies:  Allergies   Allergen Reactions    Isosorb Dinitrate-Hydralazine Other (See Comments)     Severe Headache  Severe Headache      Sulfamethoxazole-Trimethoprim Itching and Rash     Other reaction(s): Hives  Other reaction(s): Hives      Bacitracin Rash       Current Inpatient :  Current  Facility-Administered Medications   Medication Dose Route Frequency       Review of systems:  No other pertinent review of symptoms except as noted in the history of present illness.    Physical Exam:  Temp:  [97.3 F (36.3 C)] 97.3 F (36.3 C)  Heart Rate:  [124] 124  Resp Rate:  [25] 25  BP: (120)/(82) 120/82   Pulse ox: >98%  No intake or output data in the 24 hours ending 11/12/19 1718    General: AA male with HS scarring of the face in NAD on RA. Alert and oriented x 3  HEENT: NC/AT, EOMI, MMM   Neck: Trachea midline, no JVD noted  Pulmonary: Normal respiratory effort, clear lung sounds bilaterally.  Cardiovascular: tachycardic, regular rhythm, no m/r/g noted   Abdomen: Soft, NTND, NABS, no gross hepatosplenomegaly.  Extremities: No edema, no cyanosis. (+)clubbing of fingernails   Skin: HS lesions/scarring of the face, no active/open lesions/sores   Psych:  Mood was normal    EKG:   CCU admission: pending     Laboratory:  Pending     Echocardiogram:  pending    ASSESSMENT AND PLAN:     44 yo M with hx of NICM (EF 15-20% JUL2021), HS (on prednisone), CKD3 with 5 days of worsening myalgias, weakness, fever, and dyspnea transferred from [facility] with MRSA bacteremia because he is currently undergoing transplant eval with Dr. Kyung Rudd at [facility] (previously deemed not LVAD/transplant candidate by [facility]). Admitted to CICU given persistent tachycardia on transfer.     Neuro: NAI   #Pain   - oxycodone 5mg  q6hr prn for moderate pain   - Tylenol 650mg  q6hr prn for mild pain     Cardio:  #NICM (EF 15-20% [id number])  Currently undergoing transplant eval with Dr. Kyung Rudd at [facility] (previously deemed not LVAD/transplant candidate by [facility]). He was sent home from [facility] with home milrinone. VBG at admission overall unremarkable. Satting well on RA. Suspect sepsis vs HF exacerbation.   - continue milrinone gtt at 0.25  - continue home lasix 40mg  oral daily   - continue home Toprol 25mg  daily, Isordil 30mg  TID, and hydralazine 50mg  TID    - f/u lactate  - f/u trops   - formal ECHO     #tachycardia   Remains tachy. Was dig loaded at OSH. Suspect 2/2 sepsis. Milrinone may be contributing. Remains HDS. Will CTM.   - f/u dig level   - home Toprol XL 25mg  daily as above   - treat infection as below     Resp: NAI   Satting well on RA.     Renal /Fluid, Electrolytes: NAI   #CKD3 (baseline Cr appears around 2.0)   Most recent CMP from OSH with Cr 1.7. Will repeat at admission, otherwise appears at baseline.   - f/u CMP at admission     GI:   #diarrhea   Pt endorses diarrhea x5 days at onset of symptoms. Says he is going multiple times per day. Will treat bacteremia and CTM.   - trend lytes and replete prn   - can consider small fluid bolus if appears volume down 2/2 diarrhea but not needed at this time     Nutrition: heart healthy     Infectious Disease (ID):  #MRSA bacteremia   Noted to have MRSA bacteremia at OSH. WBC 28, procal 93 at [facility]. Also endorses pain at PICC insertion site in L arm. Suspect that as source of sepsis. Will continue IV abx and f/u on cultures. Will replace PICC line and culture.   - continue ceftazidime 2g q8hr   - MRSA precautions   - PICC will be pulled and replaced tomorrow AM (first availability). Will culture tip of PICC. Currently maintaining d/t limited access and need for milrinone.   - f/u OSH sensitivities   - f/u Blood cultures   - f/u lactate  - f/u procal   -  f/u CBC            Hem/Onc: NAI   #chronic anemia   Pt with chronic anemia, baseline around 11.   - f/u CBC at admission   - continue home iron sulfate 325mg  every other day     Endo: NAI   Glucose goal 100-180.     Skin:   #HS  Currently doing well on prednisone. Pt started 40mg  on 31AUG x10 days with plan to go to 30mg  09SEP x10 days and then 20mg  daily for 10 days.   - prednisone 30mg  daily x10 days (END: 18SEP)   - will taper to 20mg  daily x10 days starting 19SEP     Prophylaxis:  - VTE: Heparin SQ   - PPI:  Not indicated   - BM: not needed, pt endorses  diarrhea     Lines/Drains/Airways:  - PICC in L arm (to be replaced tomorrow)   - PIV x1     Code Status: full code    Dispo: CICU, likely can stepdown to CTUN pending initial labs and formal ECHO to r/o cardiogenic etiology     Signed by: Alton Revere, MD  Date/Time: 11/12/19 5:18 PM

## 2019-11-12 NOTE — Plan of Care (Addendum)
[facility] Attending Note     This note is in conjunction to resident's documentation. I have seen and evaluated patient in the CICU , reviewed chart, laboratory data and imaging. Discussed assessment and plan with resident and agree with their progress note but with the following additional comments/addendum:    44 year old male with a past medical history listed below presented as a transfer for advanced heart failure evaluation. Patient was initially diagnosed with heart failure in 2018. His heart failure has progressively gotten worse. He was hospitalized at [facility] in July for heart failure and discharged home on milrinone infusion at 0.25 mcg/kg/min (through a PICC line). He tolerated the infusion well. At [facility] he was deemed not a candidate for advanced heart failure therapy thus seeked a second opinion with [facility].     On 9/7 he presented to with severe body pain and aches. He was found to have MRSA bacteremia with a source likely being his PICC line.    During my encounter the patient stated that he was feeling great after being started on Prednisone last month for his Hidradenitis. He was dancing on Saturday and it was the best he has felt in the last 20 years. However his symptoms started on Saturday.    Assessment:     1) MRSA bacteremia  2) PICC line infection?  3) Nonischemic cardiomyopathy  4) CKD  5) Hidradenitis suppurativa  6) PE (December 2020)  7) History of GI bleed     Plan:    Patient's presentation more likely septic than cardiogenic at this point.   Comfortable on room air, normotensive on home Milrinone dose, appears euvolemic.   Tachycardic but stated that he has an elevated heart rate at baseline.   Would start treatment for MRSA bacteremia.   Needs blood cultures now.   Source control with removal of PICC line (difficult access), will keep PICC line tonight and remove when midline can be placed tomorrow AM.    2D echo for bacteremia.    Home  heart failure regimen.    Continue home prednisone dose (on > 1 month).   Advanced heart failure failure consultation.   Can consider Nephrology consult in AM for CKD.         Consultants: Advanced heart failure and heart transplant      Discussed POC with patient/family, nursing, consultants.     Lines/tubes/foley: PICC line (from outside hospital)     CODE STATUS: Full Code     DVT prophylaxis: Heparin  GI prophylaxis: Not indicated     Nutrition: Cardiac diet     This patient has a high probability of sudden clinically significant deterioration which requires the highest level of physician preparedness to intervene urgently. I managed/supervised life or organ supporting interventions that required frequent physician assessments. I devoted my full attention in the ICU to the direct care of this patient for this period of time.  Organ systems which are failing and require intensive, critical care support are: cardiovascular, renal, id.     Any critical care time was performed today and is exclusive of teaching, billable procedures, and not overlapping with any other providers.     Critical care time: 38 minutes    Loistine Simas, MD  [facility] Attending Physician

## 2019-11-13 ENCOUNTER — Inpatient Hospital Stay: Payer: 59

## 2019-11-13 DIAGNOSIS — I429 Cardiomyopathy, unspecified: Secondary | ICD-10-CM | POA: Diagnosis present

## 2019-11-13 DIAGNOSIS — R6510 Systemic inflammatory response syndrome (SIRS) of non-infectious origin without acute organ dysfunction: Secondary | ICD-10-CM

## 2019-11-13 DIAGNOSIS — I5042 Chronic combined systolic (congestive) and diastolic (congestive) heart failure: Secondary | ICD-10-CM

## 2019-11-13 LAB — BASIC METABOLIC PANEL
Anion Gap: 8 (ref 5.0–15.0)
Anion Gap: 8 (ref 5.0–15.0)
BUN: 53 mg/dL — ABNORMAL HIGH (ref 9.0–28.0)
BUN: 54 mg/dL — ABNORMAL HIGH (ref 9.0–28.0)
CO2: 14 mEq/L — ABNORMAL LOW (ref 22–29)
CO2: 14 mEq/L — ABNORMAL LOW (ref 22–29)
Calcium: 7.6 mg/dL — ABNORMAL LOW (ref 8.5–10.5)
Calcium: 8 mg/dL — ABNORMAL LOW (ref 8.5–10.5)
Chloride: 108 mEq/L (ref 100–111)
Chloride: 108 mEq/L (ref 100–111)
Creatinine: 2.4 mg/dL — ABNORMAL HIGH (ref 0.7–1.3)
Creatinine: 2.3 mg/dL — ABNORMAL HIGH (ref 0.7–1.3)
Glucose: 101 mg/dL — ABNORMAL HIGH (ref 70–100)
Glucose: 93 mg/dL (ref 70–100)
Potassium: 5 mEq/L (ref 3.5–5.1)
Potassium: 5 mEq/L (ref 3.5–5.1)
Sodium: 130 mEq/L — ABNORMAL LOW (ref 136–145)
Sodium: 130 mEq/L — ABNORMAL LOW (ref 136–145)

## 2019-11-13 LAB — CBC AND DIFFERENTIAL
Absolute NRBC: 0 10*3/uL (ref 0.00–0.00)
Basophils Absolute Automated: 0.03 10*3/uL (ref 0.00–0.08)
Basophils Automated: 0.3 %
Eosinophils Absolute Automated: 0.67 10*3/uL — ABNORMAL HIGH (ref 0.00–0.44)
Eosinophils Automated: 6.3 %
Hematocrit: 24.3 % — ABNORMAL LOW (ref 37.6–49.6)
Hgb: 8 g/dL — ABNORMAL LOW (ref 12.5–17.1)
Immature Granulocytes Absolute: 0.07 10*3/uL (ref 0.00–0.07)
Immature Granulocytes: 0.7 %
Lymphocytes Absolute Automated: 1.13 10*3/uL (ref 0.42–3.22)
Lymphocytes Automated: 10.6 %
MCH: 27.3 pg (ref 25.1–33.5)
MCHC: 32.9 g/dL (ref 31.5–35.8)
MCV: 82.9 fL (ref 78.0–96.0)
MPV: 10.4 fL (ref 8.9–12.5)
Monocytes Absolute Automated: 0.51 10*3/uL (ref 0.21–0.85)
Monocytes: 4.8 %
Neutrophils Absolute: 8.26 10*3/uL — ABNORMAL HIGH (ref 1.10–6.33)
Neutrophils: 77.3 %
Nucleated RBC: 0 /100 WBC (ref 0.0–0.0)
Platelets: 125 10*3/uL — ABNORMAL LOW (ref 142–346)
RBC: 2.93 10*6/uL — ABNORMAL LOW (ref 4.20–5.90)
RDW: 21 % — ABNORMAL HIGH (ref 11–15)
WBC: 10.67 10*3/uL — ABNORMAL HIGH (ref 3.10–9.50)

## 2019-11-13 LAB — GFR
EGFR: 36.1
EGFR: 37.9

## 2019-11-13 LAB — PHOSPHORUS: Phosphorus: 2.2 mg/dL — ABNORMAL LOW (ref 2.3–4.7)

## 2019-11-13 LAB — MAGNESIUM
Magnesium: 2 mg/dL (ref 1.6–2.6)
Magnesium: 2 mg/dL (ref 1.6–2.6)

## 2019-11-13 LAB — LACTIC ACID, PLASMA
Lactic Acid: 0.7 mmol/L (ref 0.2–2.0)
Lactic Acid: 1.1 mmol/L (ref 0.2–2.0)

## 2019-11-13 LAB — TROPONIN I
Troponin I: 0.43 ng/mL (ref 0.00–0.05)
Troponin I: 0.38 ng/mL (ref 0.00–0.05)

## 2019-11-13 LAB — MRSA CULTURE
Culture MRSA Surveillance: NEGATIVE
Culture MRSA Surveillance: NEGATIVE

## 2019-11-13 LAB — PROCALCITONIN: Procalcitonin: 26.4 — ABNORMAL HIGH (ref 0.00–0.10)

## 2019-11-13 MED ORDER — CALCIUM GLUCONATE-NACL 1-0.675 GM/50ML-% IV SOLN
1.0000 g | INTRAVENOUS | Status: AC
Start: 2019-11-13 — End: 2019-11-13
  Administered 2019-11-13 (×2): 1 g via INTRAVENOUS
  Filled 2019-11-13 (×2): qty 50

## 2019-11-13 MED ORDER — SENNOSIDES-DOCUSATE SODIUM 8.6-50 MG PO TABS
1.0000 | ORAL_TABLET | Freq: Every evening | ORAL | Status: DC
Start: 2019-11-13 — End: 2019-11-19
  Administered 2019-11-13 – 2019-11-15 (×2): 1 via ORAL
  Filled 2019-11-13 (×5): qty 1

## 2019-11-13 NOTE — UM Notes (Signed)
44yo male PMH NICM (EF 15-20%), on home milrinone, Hx of LV thrombus (2018; resolved) and PE, hidradenitis suppurativa (on prednisone at home), CKD who presents as a transfer from OSH for further management of MRSA bacteremia in the setting of cardiomyopathy.    History obtained from patient and chart review. He presented 9/7 to OSH (in [place], [place]) with myalgia, fever, diarrhea, SOB, exertional CP. Was found to be febrile, tachycardic HR 134, leukocytosis 28, elevated procalcitonin, with subsequent BCx growing MRSA. Started on Ceftazidime and Vancomycin, with improvement in symptoms and leukocytosis. Has PICC in place for home milrinone. Currently endorsing pain at PICC site. Transferred here for further management as he is currently undergoing transplant evaluation here.    BP 129/78    Pulse (!) 119    Temp 97.8 F (36.6 C) (Oral)    Resp (!) 23    Ht 1.854 m (6\' 1" )    Wt 73.1 kg (161 lb 2.5 oz)    SpO2 99%    BMI 21.26 kg/m     IMPRESSION:   MRSA Bacteremia, possible PICC line infection   Nonischemic cardiomyopathy - on home Milrione  ? TTE 7/20: EF 15%, moderately reduced RV function  ? Suspect his presentation is principally due to infection rather than cardiogenic. He is otherwise hemodynamically stable, with adequate BPs, though he continues with sinus tachycardia.    Hidratenitis Suppurativa   AKI on CKD (unknown baseline)   History of PE (02/2019)   History of GIB concern (01/2019)   Fe-deficiency anemia    PLAN:   Continue antibiotic coverage with Ceftaroline for now   Continue Milrione at 0.59mcg/kg/min   TTE ordered in setting of bacteremia   Continue home prednisone   Continue home metoprolol, Hydralazine-Isosorbide, Furosemide    Labs: bun 54.0, cr 2.4, tn 0.41, 0.43,  0.38, procalcitonin 26.40, bun 53.0, cr 2.4, na 130, blood cx pending,     Cardiac diet, iv teflaro bid, iv mg x 1, iv milrinone gtt, contact isolation, echo,      Cordelia Pen MSN, [employer], [place]  Utilization  Review Case Manager  [employer]  [phone]

## 2019-11-13 NOTE — Consults (Signed)
Patient's Name: [name] [name]   Admit Date: 11/12/2019  Medical Record Number: [id number]   Code Status: Full Code  Room: FI118/FI118-01  Date/Time: 11/13/19 11:12 AM  Cardiologist of record: IMG advanced heart failure; Dr. Memory Dance ([facility])    IMPRESSIONS:    MRSA Bacteremia, possible PICC line infection   Nonischemic cardiomyopathy - on home Milrione  o TTE 7/20: EF 15%, moderately reduced RV function  o Suspect his presentation is principally due to infection rather than cardiogenic. He is otherwise hemodynamically stable, with adequate BPs, though he continues with sinus tachycardia.    Hidratenitis Suppurativa   AKI on CKD stage 3 (baseline ~2.2)   History of LV thrombus (2018; resolved)   History of PE (02/2019)   History of GIB concern (01/2019)   Fe-deficiency anemia    RECOMMENDATIONS:    Continue antibiotic coverage with Ceftaroline   Continue home Milrinone 0.86mcg/kg/min   Follow up TTE   Continue home Prednisone for HS   Continue home Metoprolol XL 25mg  PO daily   Continue home Hydralazine-Isosorbide 50mg -30mg  TID   Continue home Lasix 40mg  PO daily   Strict I/Os, daily weights   Discussed with vascular access team - plan to remove PICC, place temporary midline; ideally place another PICC once he clears infection   Plan discussed with team, [facility], RN, patient    Signed: [name], MD, Cardiology Fellow  CICU CARDIOVASCULAR STAFF ATTESTATION:     I, [name], MD, have seen and examined this patient. I have reviewed and edited the details outlined by the house officer Dr Minciunescu in this note as needed. I would add the following key elements of the patient's history, physical exam and plan after my personal evaluation:       44 yo NICM felt not be candidate for advanced therapies due to extensive hidradenitis suparativa (HS).  Seen by [name], chronic milrinone therapy.  Presented with extensive myalgias and dyspnea.  Febrile, leukocytosis, tachycardic, MRSA bacteremia  s/p ABx and Dig.      Issues:    NICM, chronic combined heart failure:  Will be seen by CHF/Tx.  Not candidate at [facility] because of HS.  Toprol XL, isordil 30/hydral 50 TID.  Usually on Lasix.      Mild lactic acidosis.    Low grade troponin.  Unlikely ACS.    Taken together, this is likely from severe sepsis/septic shock, but he is stable, improving.      EP:  NSVT last night.  On milrinone.  No ICD.  Chronic LifeVest.      Anticoagulation:  History of PE/LV thrombus resolved.      Hyperkalemia:  Outside veltassa.      ID:  Full skin exam to look for abscesses.  Follow up blood cultures for Staph aureus to see how many bottles.  Hot left arm.  Ultrasound pending.      [name], MD, [name]  [name]  [name]        -------------------------------------------------------------------------------------------------------    Reason for Consult: sepsis in setting of NICM    Reason for Admission to CICU (select one)  []  Primary Cardiac Problem warranting ICU care  []  Post-procedural observation (non-surgical cardiac procedure)  [x]  General medical problem in patient with cardiac disease     []  Acute cardiovascular complication of a non-CV disease  []  Post-cardiac surgical   []  No cardiac issues (MSICU overflow)    Presenting Primary Cardiac Problem (select one)  [x]  No acute  cardiac problem - if selected, why was patient triaged to the CICU?    [x]  Primary non-cardiac problem, SPECIFY: MRSA bacteremia   []  Chronic (non-acute) cardiac problem, SPECIFY:   []  Suspected cardiac problem (e.g. ACS) that was later excluded   []  Other, clarify:   []  Acute aortic syndrome  []  Acute coronary syndrome  []  Type 2 (demand-related) MI  []  Arrhythmia: Atrial tachyarrhythmia  []  Arrhythmia: Ventricular tachycardia/VF  []  Arrhythmia: Unstable conduction disorder  []  Cardiac arrest (unknown etiology)  []  Cardiogenic shock (etiology not otherwise listed)  []  Complex congenital  []  Heart failure  []   Hypertensive emergency/urgency  []  Pericardial effusion (tamponade or pre-tamponade)  []  Pulmonary embolism  []  Pulmonary hypertension (severe)  []  Valvular heart disease (including endocarditis)  []  Procedural complication  []  Other - please specify in free text    Presenting Secondary Cardiac Problem (select one)  []  No acute cardiac problem - if selected, why was patient triaged to the CICU?    []  Primary non-cardiac problem, SPECIFY:   []  Chronic (non-acute) cardiac problem, SPECIFY:   []  Suspected cardiac problem (e.g. ACS) that was later excluded   []  Other, clarify:   []  Acute aortic syndrome  []  Acute coronary syndrome  []  Type 2 (demand-related) MI  []  Arrhythmia: Atrial tachyarrhythmia  []  Arrhythmia: Ventricular tachycardia/VF  []  Arrhythmia: Unstable conduction disorder  []  Cardiac arrest (unknown etiology)  []  Cardiogenic shock (etiology not otherwise listed)  []  Complex congenital  [x]  Heart failure  []  Hypertensive emergency/urgency  []  Pericardial effusion (tamponade or pre-tamponade)  []  Pulmonary embolism  []  Pulmonary hypertension (severe)  []  Valvular heart disease (including endocarditis)  []  Procedural complication  []  No secondary cardiac problem  []  Other - please specify in free text     Indications (select one)  []  Cardiac arrest  []  Respiratory insufficiency  []  Hypotension without shock  []  Cardiogenic shock  []  Other shock (includes distributive and mixed etiology)  []  Unstable arrhythmia  []  Neurological emergency  []  Need for acute renal replacement therapy [omit for chronic routine dialysis]   []  Need for IV vasoactive therapy in absence of shock or hypotension  []  Need for ICU protocol, medication or device (e.g. EKOS, inhaled NO, or allergy  desensitization), SPECIFY:  []  Post-procedural monitoring (e.g. TAVR, absent a primary reason otherwise specified  above)  [x]  Need for frequent labs or monitoring (not specified above)  []  Other, SPECIFY:     Location of first CARDIAC ARREST  (select one) [if applicable]   []  In the field/at home   []  In-hospital  []  At OSH    Initial Pulseless Rhythm (select one)  []  VF or AED-Shockable  []  Ventricular tachycardia  []  Asystole  []  PEA  []  Unknown    History:  44yo male PMH NICM (EF 15-20%), on home milrinone, Hx of LV thrombus (2018; resolved) and PE, hidradenitis suppurativa (on prednisone at home), CKD who presents as a transfer from OSH for further management of MRSA bacteremia in the setting of cardiomyopathy.    History obtained from patient and chart review. He presented 9/7 to OSH (in [place], [place]) with myalgia, fever, diarrhea, SOB, exertional CP. Was found to be febrile, tachycardic HR 134, leukocytosis 28, elevated procalcitonin, with subsequent BCx growing MRSA. Started on Ceftazidime and Vancomycin, with improvement in symptoms and leukocytosis. Has PICC in place for home milrinone. Currently endorsing pain at PICC site. Transferred here for further management as he is currently undergoing transplant evaluation here.  PMH:    No past medical history on file.    PSH:    No past surgical history on file.    FH:  Family History   Problem Relation Age of Onset    Hypertension Brother        Social History:  Social History     Socioeconomic History    Marital status: Single     Spouse name: Not on file    Number of children: Not on file    Years of education: Not on file    Highest education level: Not on file   Occupational History    Not on file   Tobacco Use    Smoking status: Former Smoker     Packs/day: 0.25     Years: 20.00     Pack years: 5.00     Types: Cigarettes     Quit date: 09/06/2019     Years since quitting: 0.1    Smokeless tobacco: Never Used   Vaping Use    Vaping Use: Former   Substance and Sexual Activity    Alcohol use: Not Currently     Alcohol/week: 2.0 standard drinks     Types: 2 Cans of beer per week     Comment: socially    Drug use: Never    Sexual activity: Not on file   Other Topics Concern    Not on  file   Social History Narrative    Not on file     Social Determinants of Health     Financial Resource Strain:     Difficulty of Paying Living Expenses:    Food Insecurity:     Worried About Programme researcher, broadcasting/film/video in the Last Year:     Barista in the Last Year:    Transportation Needs:     Freight forwarder (Medical):     Lack of Transportation (Non-Medical):    Physical Activity:     Days of Exercise per Week:     Minutes of Exercise per Session:    Stress:     Feeling of Stress :    Social Connections:     Frequency of Communication with Friends and Family:     Frequency of Social Gatherings with Friends and Family:     Attends Religious Services:     Active Member of Clubs or Organizations:     Attends Engineer, structural:     Marital Status:    Intimate Partner Violence:     Fear of Current or Ex-Partner:     Emotionally Abused:     Physically Abused:     Sexually Abused:        Home medications:   Prior to Admission medications    Medication Sig Start Date End Date Taking? Authorizing Provider   busPIRone (BUSPAR) 5 MG tablet Take 5 mg by mouth 2 (two) times daily 10/06/19   [provider]   ferrous sulfate 325 (65 FE) MG EC tablet Take 325 mg by mouth every other day 02/19/19   [provider]   furosemide (LASIX) 40 MG tablet Take 40 mg by mouth daily 02/19/19   [provider]   hydrALAZINE (APRESOLINE) 50 MG tablet Take 50 mg by mouth 3 (three) times daily 10/06/19   [provider]   hydrOXYzine (ATARAX) 25 MG tablet Take 12.5 mg by mouth 3 (three) times daily as needed 10/06/19   [provider]   isosorbide  dinitrate (ISORDIL) 30 MG tablet Take 30 mg by mouth 3 (three) times daily 10/06/19   [provider]   magnesium oxide (MAG-OX) 400 MG tablet Take 800 mg by mouth 2 (two) times daily 10/06/19   [provider]   metoprolol succinate XL (TOPROL-XL) 25 MG 24 hr tablet Take 25 mg by mouth daily 10/06/19    [provider]   Milrinone Lactate in Dextrose 40-5 MG/200ML-% Solution Infuse 0.25 mcg/kg/min into the vein continuous 10/05/19   [provider]   mirtazapine (REMERON) 7.5 MG tablet Take 7.5 mg by mouth nightly 10/06/19   [provider]   Multiple Vitamins-Minerals (multivitamin with minerals) tablet Take 1 tablet by mouth daily 10/07/19   [provider]   naloxone (Narcan) 4 MG/0.1ML nasal spray 1 spray by Nasal route as needed    [provider]   nystatin (NYSTOP) powder Apply 1 application topically as needed 10/06/19   [provider]   polyethylene glycol (MIRALAX) 17 GM/SCOOP powder Take 17 g by mouth as needed 10/07/19   [provider]   Thiamine HCl (VITAMIN B-1 PO) Take 100 mg by mouth daily 04/24/19   [provider]   triamcinolone (KENALOG) 0.1 % ointment Apply 1 application topically as needed 09/03/19   [provider]   Veltassa 8.4 g Pack Take 1 packet by mouth daily 10/06/19   [provider]   Wound Cleansers (Vashe Cleansing) Solution Apply 1 applicator topically as needed 09/03/19   [provider]        Inpatient/ER medications:  Medications Prior to Admission   Medication Sig    busPIRone (BUSPAR) 5 MG tablet Take 5 mg by mouth 2 (two) times daily    ferrous sulfate 325 (65 FE) MG EC tablet Take 325 mg by mouth every other day    furosemide (LASIX) 40 MG tablet Take 40 mg by mouth daily    hydrALAZINE (APRESOLINE) 50 MG tablet Take 50 mg by mouth 3 (three) times daily    hydrOXYzine (ATARAX) 25 MG tablet Take 12.5 mg by mouth 3 (three) times daily as needed    isosorbide dinitrate (ISORDIL) 30 MG tablet Take 30 mg by mouth 3 (three) times daily    magnesium oxide (MAG-OX) 400 MG tablet Take 800 mg by mouth 2 (two) times daily    metoprolol succinate XL (TOPROL-XL) 25 MG 24 hr tablet Take 25 mg by mouth daily    Milrinone Lactate in Dextrose 40-5 MG/200ML-% Solution Infuse 0.25 mcg/kg/min into the  vein continuous    mirtazapine (REMERON) 7.5 MG tablet Take 7.5 mg by mouth nightly    Multiple Vitamins-Minerals (multivitamin with minerals) tablet Take 1 tablet by mouth daily    naloxone (Narcan) 4 MG/0.1ML nasal spray 1 spray by Nasal route as needed    nystatin (NYSTOP) powder Apply 1 application topically as needed    polyethylene glycol (MIRALAX) 17 GM/SCOOP powder Take 17 g by mouth as needed    Thiamine HCl (VITAMIN B-1 PO) Take 100 mg by mouth daily    triamcinolone (KENALOG) 0.1 % ointment Apply 1 application topically as needed    Veltassa 8.4 g Pack Take 1 packet by mouth daily    Wound Cleansers (Vashe Cleansing) Solution Apply 1 applicator topically as needed       Current Inpatient :  Current Facility-Administered Medications   Medication Dose Route Frequency    ceftaroline fosamil  400 mg Intravenous Q12H    ferrous sulfate  324 mg Oral Q48H    furosemide  40 mg Oral Daily    heparin (porcine)  5,000 Units Subcutaneous Q8H SCH    hydrALAZINE  50 mg Oral Q8H SCH    isosorbide dinitrate  30 mg Oral TID - Nitrate Free Interval    metoprolol succinate XL  25 mg Oral Daily    predniSONE  30 mg Oral QAM W/BREAKFAST    senna-docusate  1 tablet Oral QHS      milrinone 0.25 mcg/kg/min (11/13/19 1000)       ROS:  No constitutional complaints, no respiratory complaints, no musculoskeletal complaints, or other pertinent review of symptoms except as noted in the history of present illness.    Physical Exam:  I/O last 3 completed shifts:  In: 1695.35 [P.O.:1320; I.V.:75.35; IV Piggyback:300]  Out: 575 [Urine:575]  Weight:   Wt Readings from Last 4 Encounters:   11/13/19 77.1 kg (169 lb 15.6 oz)   10/20/19 70.1 kg (154 lb 8 oz)     Vitals:    11/13/19 1000   BP: 122/85   Pulse: (!) 108   Resp: (!) 26   Temp:    SpO2: 100%      General: in no distress.  Alert and oriented x 3  Eyes: No xanthelasma.  Oropharynx: No mucosal pallor.  Neck: Trachea midline, JVD   Pulmonary: Normal respiratory  effort, clear lung sounds bilaterally.  Cardiovascular: PMI nondisplaced with normal rate and rhythm, normal S1, normal S2, no murmur, rub, or gallop appreciated.  Abdomen: Soft, nontender.  Extremities: No edema, no clubbing, no cyanosis.  Skin: No tendinous xanthomas, diffuse scarring on face and body; likely sequella from HS  Psych:  Mood was normal    Laboratory/Imaging/Procedures:    Recent Labs     11/13/19  0545 11/13/19  0008 11/12/19  1749   Sodium 130* 130* 133*   Potassium 5.0 5.0 3.9   Chloride 108 108 109   CO2 14* 14* 15*   BUN 54.0* 53.0* 54.0*   Calcium 8.0* 7.6* 7.8*   Magnesium 2.0 2.0 1.7   Phosphorus 2.2*  --  2.6     @LABCNT (LLBILI3)@  Recent Labs     11/13/19  0545 11/12/19  1749   WBC 10.67* 9.05   Hgb 8.0* 8.4*   Hematocrit 24.3* 27.1*   Platelets 125* 125*     Recent Labs     11/12/19  1807   PT 16.4*   PT INR 1.4*   PTT 30     Invalid input(s): LAC  Invalid input(s): LLCARDENZ3

## 2019-11-13 NOTE — Transfer Summary (Signed)
CICU TRANSFER SUMMARY    44 yo M with hx of NICM (EF 15-20% [id number]), HS (on prednisone),CKD3 with 5 days ofworsening myalgias, weakness,fever,and dyspnea transferred from [facility] with MRSA bacteremia because he is currently undergoing transplant eval with Dr. Kyung Rudd at [facility] (previously deemed not LVAD/transplant candidate by [facility]). Admitted to CICU given persistent tachycardia and c/f cardiogenic component.     CICU COURSE  He was continued on Abx at admission. WBC and procal downtrending from OSH. Remains afebrile. Tachycardia improving. He is satting well on RA, warm and well perfused. Suspected symptoms 2/2 sepsis, unlikely cardiogenic component. Potentially d/t infected PICC line, will replace and culture line from OSH. F/u on cultures. Will repeat ECHO to eval and transfer from CICU to CTUN.    To Do  - F/u ECHO  - f/u U/S LUE  - f/u blood cultures at admission   - continue milrinone 0.25   - replace PICC with midline   - continue ceftaroline 400mg  q12hr   - continue prednisone 30mg  daily x10 days (END: 18SEP), will taper to 20mg  daily x10 days starting 19SEPfor HS  - continue home lasix 40mg  oral daily   - continue home Toprol 25mg  daily, Isordil 30mg  TID, and hydralazine 50mg  TID    Case was discussed with Dr. Burnell Blanks who accepts pt for downgrade to CTUN.    [facility] Trans[facility] has been notified and will be following pt for the remainder of hospitalization.

## 2019-11-13 NOTE — Plan of Care (Signed)
ICU Checklist      Ventilator Bundle    Patient on ventilator: No      Line Bundle    Central line present: No      Foley Bundle    Foley catheter present?: No      General Care Bundle    Restraints Renewed?: Not Applicable    Fluid balance achieved?: Yes    DVT Prophylaxis ordered?: Yes    Nutrition ordered?: Yes    Bowel movement in last 48 hours?: No    If no, bowel regimen in place?: Yes    PUD prophylaxis: Not Applicable    Mobility protocol: PT/OT ordered? No

## 2019-11-13 NOTE — Plan of Care (Signed)
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      CV:   - was never hypotensive  - received abx at OSH but did not receive significant IVF (given CM)  - LA now WNL (initial 3)  - thus far tolerating continuation of home metoprolol, hydralazine and Isordil  - low threshold to hold GDMT in context of bacteremia / sepsis  - c/w home milrinone 0.25  - needs home PICC removed (could be source of bacteremia)  - needs midline for milrinone  - runs of NSVT  - does not have ICD  - Echo  - trop 0.40    ID:  - BCx at OSH reportedly growing MRSA  - repeat BCx here  - will need to r/o IE if persistently bacteremic  - ceftaroline  - most likely source is PICC (has painful swelling at PICC insertion site)  - [place] LUE to r/o abscess or DVT  - if abscess, will need evaluation by Surgery for possible I&D  - no other painful swelling of HA (inspected bilateral axillae, groin, pt refused inspection of perineum / buttocks but denies pain anywhere except LUE)  - some c/o diarrhea (none presently)  - COVID neg    Renal/Fluid, Electrolytes:  - Cr 2.4 (baseline 2)  - NAGMA (perhaps from diarrhea and ARF?), CTM  - c/w home patiromer (Veltassa, K+ binder, which can cause diarrhea)  - tolerating home Lasix  - low threshold to hold home Lasix in context of sepsis    GI/Nurition:  - T bili 2.3, will fractionate  - CTM LFTs  - no abdominal pain  - may be sepsis-induced cholestasis    MSK  - c/w home prednisone for HA  - no stress dose steroids as hemodynamically stable    Lines/Tubes:   - home PICC needs to come out  - will need midline    PPx:   -DVT: SQH   -GI: not indicated  Code status: FULL

## 2019-11-13 NOTE — Plan of Care (Signed)
Problem: Compromised Tissue integrity  Goal: Damaged tissue is healing and protected  11/13/2019 2050 by Janyth Contes, RN  Outcome: Progressing  11/13/2019 1341 by Janyth Contes, RN  Outcome: Progressing  Goal: Nutritional status is improving  11/13/2019 2050 by Janyth Contes, RN  Outcome: Progressing  11/13/2019 1341 by Janyth Contes, RN  Outcome: Progressing     Problem: Day of Admission - Heart Failure  Goal: Heart Failure Admission  11/13/2019 2050 by Janyth Contes, RN  Outcome: Progressing  11/13/2019 1341 by Janyth Contes, RN  Outcome: Progressing     Problem: Everyday - Heart Failure  Goal: Stable Vital Signs and Fluid Balance  11/13/2019 2050 by Janyth Contes, RN  Outcome: Progressing  11/13/2019 1341 by Janyth Contes, RN  Outcome: Progressing  Goal: Mobility/Activity is Maintained at Optimal Level for Patient  11/13/2019 2050 by Janyth Contes, RN  Outcome: Progressing  11/13/2019 1341 by Janyth Contes, RN  Outcome: Progressing  Goal: Nutritional Intake is Adequate  11/13/2019 2050 by Janyth Contes, RN  Outcome: Progressing  11/13/2019 1341 by Janyth Contes, RN  Outcome: Progressing  Goal: Teaching-Using CHF Warning Zones and Educational Videos  11/13/2019 2050 by Janyth Contes, RN  Outcome: Progressing  11/13/2019 1341 by Janyth Contes, RN  Outcome: Progressing     Problem: Inadequate Cardiac Output  Goal: Adequate tissue perfusion will be maintained  11/13/2019 2050 by Janyth Contes, RN  Outcome: Progressing  11/13/2019 1341 by Janyth Contes, RN  Outcome: Progressing     Problem: Safety  Goal: Patient will be free from injury during hospitalization  11/13/2019 2050 by Janyth Contes, RN  Outcome: Progressing  11/13/2019 1341 by Janyth Contes, RN  Outcome: Progressing  Goal: Patient will be free from infection during hospitalization  11/13/2019 2050 by Janyth Contes, RN  Outcome: Progressing  11/13/2019 1341 by Janyth Contes,  RN  Outcome: Progressing     Problem: Pain  Goal: Pain at adequate level as identified by patient  11/13/2019 2050 by Janyth Contes, RN  Outcome: Progressing  11/13/2019 1341 by Janyth Contes, RN  Outcome: Progressing     Problem: Side Effects from Pain Analgesia  Goal: Patient will experience minimal side effects of analgesic therapy  11/13/2019 2050 by Janyth Contes, RN  Outcome: Progressing  11/13/2019 1341 by Janyth Contes, RN  Outcome: Progressing     Problem: Discharge Barriers  Goal: Patient will be discharged home or other facility with appropriate resources  11/13/2019 2050 by Janyth Contes, RN  Outcome: Progressing  11/13/2019 1341 by Janyth Contes, RN  Outcome: Progressing     Problem: Psychosocial and Spiritual Needs  Goal: Demonstrates ability to cope with hospitalization/illness  11/13/2019 2050 by Janyth Contes, RN  Outcome: Progressing  11/13/2019 1341 by Janyth Contes, RN  Outcome: Progressing     Problem: Moderate/High Fall Risk Score >5  Goal: Patient will remain free of falls  11/13/2019 2050 by Janyth Contes, RN  Outcome: Progressing  11/13/2019 1341 by Janyth Contes, RN  Outcome: Progressing
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
## 2019-11-13 NOTE — Progress Notes (Signed)
LUA PICC Line removed & catheter tip cultured per order by this RN. [place] protocol followed. Pt tolerated well.

## 2019-11-13 NOTE — Plan of Care (Signed)
Pt name/Code: [name] [name] (44 y.o. male);Full Code  Admit Date/Dx: 11/12/2019 Cardiomyopathy  Weights: Last 3 Weights for the past 72 hrs (Last 3 readings):   Weight   11/13/19 0343 77.1 kg (169 lb 15.6 oz)   11/12/19 1700 73.1 kg (161 lb 2.5 oz)       Shift comment:    Neuro: AOx 4  Pain:  C/o no  Rhythm on Tele:  SR/St  Oxygen/Airway: Ra  GI:  Continent y No data recorded  GU: Continent y  Diet: Diet heart healthy  IV Access: Midline placed L upper arm; PIV L hand  Falls Risk: High/Mod  Ambulation: Appropriately used call bell to address needs. Call bell within reach; no bed/chair alarms on-pt refused, bed in lowest position. Fall mat in place. Pt remains safe within the shift. Hourly rounding done, will con't to monitor.  Plan:   -midline for Milrinone  -PICC removal  -cult PICC tip  -IV ABX        Shift Skin Assessment:  Bony Areas Redness?  Y/N Blanchable? Y/N  If no- place WOCN consult Open?  Y/N Diameter  if applicable Comments   ? ? ? EARS  ? ? ?   Ear (R) ?n ? ? ? ?   Ear (L) ?n ? ? ? ?   ? ? ? ELBOWS  ? ? ?   Elbow (R) ?n ? ? ? ?   Elbow (L) n ? ? ? ?   ? ? ? HIPS  ? ? ?   Hip (R) ?n ? ? ? ?   Hip (L) ?n ? ? ? ?   Sacrum ?n ? ? ? ?   ? ? ? HEELS ? ?   Heel (R) ?n ? ? ? ?   Heel (L) ?n ? ? ? ?   ? ? ? DEVICES ? ?   Underneath ? ? ? ? ?   Above ? ? ? ? ?     Mult. Scar all over body from HS and severe Acne  ? I/O:   ?   ? Intake/Output Summary (Last 24 hours) at 11/13/2019 0700  ? Last data filed at 11/13/2019 0700  Gross per 24 hour   Intake 1695.35 ml   Output 575 ml   Net 1120.35 ml       Recent Labs   Lab 11/13/19  0545   Creatinine 2.3*         Recent Labs   Lab 11/13/19  0545   Potassium 5.0        Recent Labs   Lab 11/13/19  0545   Magnesium 2.0          Problem: Compromised Tissue integrity  Goal: Damaged tissue is healing and protected  Outcome: Progressing  Goal: Nutritional status is improving  Outcome: Progressing     Problem: Day of Admission - Heart Failure  Goal: Heart Failure  Admission  Outcome: Progressing     Problem: Everyday - Heart Failure  Goal: Stable Vital Signs and Fluid Balance  Outcome: Progressing  Goal: Mobility/Activity is Maintained at Optimal Level for Patient  Outcome: Progressing  Goal: Nutritional Intake is Adequate  Outcome: Progressing  Goal: Teaching-Using CHF Warning Zones and Educational Videos  Outcome: Progressing     Problem: Inadequate Cardiac Output  Goal: Adequate tissue perfusion will be maintained  Outcome: Progressing     Problem: Safety  Goal: Patient will be free from injury during hospitalization  Outcome: Progressing  Goal: Patient will be free from infection during hospitalization  Outcome: Progressing     Problem: Pain  Goal: Pain at adequate level as identified by patient  Outcome: Progressing     Problem: Side Effects from Pain Analgesia  Goal: Patient will experience minimal side effects of analgesic therapy  Outcome: Progressing     Problem: Discharge Barriers  Goal: Patient will be discharged home or other facility with appropriate resources  Outcome: Progressing     Problem: Psychosocial and Spiritual Needs  Goal: Demonstrates ability to cope with hospitalization/illness  Outcome: Progressing     Problem: Moderate/High Fall Risk Score >5  Goal: Patient will remain free of falls  Outcome: Progressing

## 2019-11-13 NOTE — Nursing Progress Note (Signed)
Assumed care of pt w/ no specific c/o. Afebrile. Heart monitor shows STach 120s. Episode of NSVT---18bts. Pt c/o palp.MD aware. No new orders at this time. Denies SOB & dyspnea at rest. RA Sat 99%. Lungs clear/ diminished. Maintained on Milrinone @0 .7mcg/kg via LUA PICC. Mg & Ca Gluconate given. Pt tolerated Teflaro w/o untowards reaction. Refused Heparin SC. MD made aware. NAD

## 2019-11-13 NOTE — Consults (Signed)
MIDLINE INSERTION PROCEDURE     PER DR. Susa Loffler, MIDLINE PLACED FOR MILRINONE INFUSION; ALSO MD GIVEN PERMISSION PLACE MIDLINE WITH CKD HISTORY    [name] [name],[name] [name]  11/13/2019    INDICATIONS: Therapy less than 28 days    The midline procedure, risks, benefits were discussed with thepatient.  All questions were answered  patient verbalized understanding and agreed to proceed. Midline education/instructions provided to patient.    PROCEDURE DETAILS:   The patient was positioned and Ultrasound was used to confirm patency of the Right Brachial vein prior to obtaining venous access. The arm was scrubbed with 2% chlorhexidine per guidelines and a maximal sterile field was established for the patient.  The clinician was attired with cap, mask and sterile gown/gloves prior to start.     ARROW POWER MIDLINE    Patient did tolerate the procedure well.   Catheter Type: MIDLINE  Insertion Site: Right Brachial vein  Total length: 14CM  Internal Length:  14cm  External Length: 0cm  UAC: 29cm    Midline Reference #: [id number]  Midline Kit Lot#: [id number]  Midline Kit expiration date: 12/01/2020    Findings/Conclusions:  No signs of bleeding or symptoms of nerve irritation noted at time of insertion procedure.    Tip location is below the level of the axilla in Brachial vein. Midline is ready for immediate use.    Midline is ready for immediate use    Delorise Jackson, [employer]

## 2019-11-13 NOTE — Plan of Care (Signed)
Pt name/Code: [name] [name] (44 y.o. male);Full Code  Admit Date/Dx: 11/12/2019 Cardiomyopathy  Weights: Last 3 Weights for the past 72 hrs (Last 3 readings):   Weight   11/13/19 0343 77.1 kg (169 lb 15.6 oz)   11/12/19 1700 73.1 kg (161 lb 2.5 oz)       Shift comment:    Neuro: AOx 4. Pt tearful   Pain: pt states 12/10 pain of LUA where PICC line was previously. Prn pain medication administered with moderate relief   Rhythm on Tele: NSR/Sinus tachycardia on tele, HR in the 90s to 110s  \Oxygen/Airway: RA sating > 97%  GI:  Continent. Per pt, Last BM 9/11  GU: Continent   Diet: Diet heart healthy  IV Access: Midline RUA, 22 G R hand  Falls Risk: moderate falls risk   Ambulation: independent   Plan:   -continue Milrinone gtt at 0.25 mcg/kg/min  -doppler ordered  -continue ceftraroline IVPB q12hrs  -I/Os, daily weights  -monitor labs      Problem: Compromised Tissue integrity  Goal: Damaged tissue is healing and protected  Outcome: Progressing     Problem: Everyday - Heart Failure  Goal: Stable Vital Signs and Fluid Balance  Outcome: Progressing     Problem: Inadequate Cardiac Output  Goal: Adequate tissue perfusion will be maintained  Outcome: Progressing     Problem: Pain  Goal: Pain at adequate level as identified by patient  Outcome: Progressing     Problem: Side Effects from Pain Analgesia  Goal: Patient will experience minimal side effects of analgesic therapy  Outcome: Progressing     Problem: Psychosocial and Spiritual Needs  Goal: Demonstrates ability to cope with hospitalization/illness  Outcome: Progressing     Problem: Moderate/High Fall Risk Score >5  Goal: Patient will remain free of falls  Outcome: Progressing

## 2019-11-14 ENCOUNTER — Inpatient Hospital Stay (HOSPITAL_COMMUNITY): Payer: 59

## 2019-11-14 ENCOUNTER — Inpatient Hospital Stay: Payer: 59

## 2019-11-14 DIAGNOSIS — D509 Iron deficiency anemia, unspecified: Secondary | ICD-10-CM

## 2019-11-14 DIAGNOSIS — I272 Pulmonary hypertension, unspecified: Secondary | ICD-10-CM

## 2019-11-14 DIAGNOSIS — I77810 Thoracic aortic ectasia: Secondary | ICD-10-CM

## 2019-11-14 DIAGNOSIS — I081 Rheumatic disorders of both mitral and tricuspid valves: Secondary | ICD-10-CM

## 2019-11-14 DIAGNOSIS — I517 Cardiomegaly: Secondary | ICD-10-CM

## 2019-11-14 DIAGNOSIS — E1122 Type 2 diabetes mellitus with diabetic chronic kidney disease: Secondary | ICD-10-CM

## 2019-11-14 LAB — ECHO ADULT TTE COMP 2D M-MODE CLR DOP WAV W MYOCARD STRAIN
AV Area (Cont Eq VTI): 4.26
AV Area (Cont Eq VTI): 4.639
AV Mean Gradient: 6
AV Peak Velocity: 171
Ao Root Diameter (2D): 3.5
BP Mod LV Ejection Fraction: 27.1
GLS Average: -6.5
IVS Diastolic Thickness (2D): 0.991
LA Dimension (2D): 5
LA Volume Index (BP A-L): 0.086
LVID diastole (2D): 7.13
LVID systole (2D): 6.38
MV Area (PHT): 5.126
MV E/A: 2.2
MV E/A: 2.22
MV E/e' (Average): 21.37
Mitral Valve Findings: NORMAL
Prox Ascending Aorta Diameter: 3.5
Pulmonary Valve Findings: NORMAL
RV Basal Diastolic Dimension: 6.64
RV Systolic Pressure: 72.726
RV Systolic Pressure: 73
TAPSE: 1.63
TAPSE: 1.77
Tricuspid Valve Findings: NORMAL

## 2019-11-14 LAB — CBC
Absolute NRBC: 0 10*3/uL (ref 0.00–0.00)
Hematocrit: 25.6 % — ABNORMAL LOW (ref 37.6–49.6)
Hgb: 8.4 g/dL — ABNORMAL LOW (ref 12.5–17.1)
MCH: 27 pg (ref 25.1–33.5)
MCHC: 32.8 g/dL (ref 31.5–35.8)
MCV: 82.3 fL (ref 78.0–96.0)
MPV: 9.8 fL (ref 8.9–12.5)
Nucleated RBC: 0 /100 WBC (ref 0.0–0.0)
Platelets: 210 10*3/uL (ref 142–346)
RBC: 3.11 10*6/uL — ABNORMAL LOW (ref 4.20–5.90)
RDW: 22 % — ABNORMAL HIGH (ref 11–15)
WBC: 14.04 10*3/uL — ABNORMAL HIGH (ref 3.10–9.50)

## 2019-11-14 LAB — CBC AND DIFFERENTIAL
Absolute NRBC: 0 10*3/uL (ref 0.00–0.00)
Basophils Absolute Automated: 0.03 10*3/uL (ref 0.00–0.08)
Basophils Automated: 0.2 %
Eosinophils Absolute Automated: 0.03 10*3/uL (ref 0.00–0.44)
Eosinophils Automated: 0.2 %
Hematocrit: 25.6 % — ABNORMAL LOW (ref 37.6–49.6)
Hgb: 8.4 g/dL — ABNORMAL LOW (ref 12.5–17.1)
Immature Granulocytes Absolute: 0.1 10*3/uL — ABNORMAL HIGH (ref 0.00–0.07)
Immature Granulocytes: 0.8 %
Lymphocytes Absolute Automated: 1.17 10*3/uL (ref 0.42–3.22)
Lymphocytes Automated: 8.8 %
MCH: 26.7 pg (ref 25.1–33.5)
MCHC: 32.8 g/dL (ref 31.5–35.8)
MCV: 81.3 fL (ref 78.0–96.0)
MPV: 10.4 fL (ref 8.9–12.5)
Monocytes Absolute Automated: 0.49 10*3/uL (ref 0.21–0.85)
Monocytes: 3.7 %
Neutrophils Absolute: 11.49 10*3/uL — ABNORMAL HIGH (ref 1.10–6.33)
Neutrophils: 86.3 %
Nucleated RBC: 0 /100 WBC (ref 0.0–0.0)
Platelets: 161 10*3/uL (ref 142–346)
RBC: 3.15 10*6/uL — ABNORMAL LOW (ref 4.20–5.90)
RDW: 22 % — ABNORMAL HIGH (ref 11–15)
WBC: 13.31 10*3/uL — ABNORMAL HIGH (ref 3.10–9.50)

## 2019-11-14 LAB — BLOOD CULTURE RAPID IDENTIFICATION: GRAM POSITIVE PANEL
Bacillus cereus group ID by NAA: NOT DETECTED
Bacillus subtilis group ID by NAA: NOT DETECTED
Candida spp. (C.albicans, C.glabrata, C. krusei, C. parapsilosis) ID by NAA: NOT DETECTED
Corynebacterium species ID by NAA: NOT DETECTED
Cutibacterium acnes (Propionibacterium acnes) ID by NAA: NOT DETECTED
Enterococcus faecalis ID by NAA: NOT DETECTED
Enterococcus faecium ID by NAA: NOT DETECTED
Enterococcus species ID by NAA: NOT DETECTED
Lactobacillus species ID by NAA: NOT DETECTED
Listeria monocytogenes ID by NAA: NOT DETECTED
Listeria species ID by NAA: NOT DETECTED
Micrococcus species ID by NAA: NOT DETECTED
Pan Gram-Negative ID by NAA: NOT DETECTED
Staphylococcus aureus ID by NAA: NOT DETECTED
Staphylococcus epidermidis ID by NAA: DETECTED — AB
Staphylococcus lugdunensis ID by NAA: NOT DETECTED
Staphylococcus species ID by NAA: DETECTED — AB
Streptococcus agalactiae (GBS) ID by NAA: NOT DETECTED
Streptococcus anginosus group ID by NAA: NOT DETECTED
Streptococcus pneumoniae ID by NAA: NOT DETECTED
Streptococcus pyogenes (GAS) ID by NAA: NOT DETECTED
Streptococcus species ID by NAA: NOT DETECTED
mecA Methicillin Resistance Gene (Staphylococcus Only) ID by NAA: DETECTED — AB
mecC Methicillin Resistance Gene (Staphylococcous Only) ID by NAA: NOT DETECTED

## 2019-11-14 LAB — GFR
EGFR: 36.1
EGFR: 37.9

## 2019-11-14 LAB — BASIC METABOLIC PANEL
Anion Gap: 9 (ref 5.0–15.0)
Anion Gap: 10 (ref 5.0–15.0)
BUN: 57 mg/dL — ABNORMAL HIGH (ref 9.0–28.0)
BUN: 55 mg/dL — ABNORMAL HIGH (ref 9.0–28.0)
CO2: 13 mEq/L — ABNORMAL LOW (ref 22–29)
CO2: 14 mEq/L — ABNORMAL LOW (ref 22–29)
Calcium: 8.1 mg/dL — ABNORMAL LOW (ref 8.5–10.5)
Calcium: 7.9 mg/dL — ABNORMAL LOW (ref 8.5–10.5)
Chloride: 107 mEq/L (ref 100–111)
Chloride: 109 mEq/L (ref 100–111)
Creatinine: 2.4 mg/dL — ABNORMAL HIGH (ref 0.7–1.3)
Creatinine: 2.3 mg/dL — ABNORMAL HIGH (ref 0.7–1.3)
Glucose: 128 mg/dL — ABNORMAL HIGH (ref 70–100)
Glucose: 135 mg/dL — ABNORMAL HIGH (ref 70–100)
Potassium: 5.6 mEq/L — ABNORMAL HIGH (ref 3.5–5.1)
Potassium: 4.9 mEq/L (ref 3.5–5.1)
Sodium: 129 mEq/L — ABNORMAL LOW (ref 136–145)
Sodium: 133 mEq/L — ABNORMAL LOW (ref 136–145)

## 2019-11-14 LAB — GLUCOSE WHOLE BLOOD - POCT
Whole Blood Glucose POCT: 108 mg/dL — ABNORMAL HIGH (ref 70–100)
Whole Blood Glucose POCT: 90 mg/dL (ref 70–100)
Whole Blood Glucose POCT: 119 mg/dL — ABNORMAL HIGH (ref 70–100)

## 2019-11-14 LAB — ANTI-XA,UFH: Anti-Xa, UFH: <0.04

## 2019-11-14 LAB — PHOSPHORUS: Phosphorus: 2.5 mg/dL (ref 2.3–4.7)

## 2019-11-14 LAB — MAGNESIUM: Magnesium: 2 mg/dL (ref 1.6–2.6)

## 2019-11-14 LAB — APTT: PTT: 30 s (ref 27–39)

## 2019-11-14 LAB — LACTIC ACID, PLASMA
Lactic Acid: 1.2 mmol/L (ref 0.2–2.0)
Lactic Acid: 1.4 mmol/L (ref 0.2–2.0)

## 2019-11-14 MED ORDER — SODIUM POLYSTYRENE SULFONATE 15 GM/60ML PO SUSP
15.0000 g | Freq: Once | ORAL | Status: AC
Start: 2019-11-14 — End: 2019-11-14
  Administered 2019-11-14: 15 g via ORAL
  Filled 2019-11-14: qty 60

## 2019-11-14 MED ORDER — HEPARIN SODIUM (PORCINE) 5000 UNIT/ML IJ SOLN
80.0000 [IU]/kg | INTRAMUSCULAR | Status: DC | PRN
Start: 2019-11-14 — End: 2019-11-18
  Administered 2019-11-15 – 2019-11-16 (×2): 5950 [IU] via INTRAVENOUS
  Filled 2019-11-14 (×2): qty 2

## 2019-11-14 MED ORDER — DEXTROSE 50 % IV SOLN
0.0000 g | Freq: Once | INTRAVENOUS | Status: AC
Start: 2019-11-14 — End: 2019-11-14
  Administered 2019-11-14: 50 g via INTRAVENOUS
  Filled 2019-11-14 (×2): qty 50

## 2019-11-14 MED ORDER — ALBUTEROL SULFATE (2.5 MG/3ML) 0.083% IN NEBU
10.0000 mg | INHALATION_SOLUTION | Freq: Once | RESPIRATORY_TRACT | Status: DC
Start: 2019-11-14 — End: 2019-11-14

## 2019-11-14 MED ORDER — INSULIN SYRINGES (DISPOSABLE) U-100 1 ML MISC
5.0000 [IU] | Freq: Once | Status: AC
Start: 2019-11-14 — End: 2019-11-14
  Administered 2019-11-14: 5 [IU] via INTRAVENOUS
  Filled 2019-11-14: qty 15

## 2019-11-14 MED ORDER — HEPARIN SODIUM (PORCINE) 5000 UNIT/ML IJ SOLN
40.0000 [IU]/kg | INTRAMUSCULAR | Status: DC | PRN
Start: 2019-11-14 — End: 2019-11-18

## 2019-11-14 MED ORDER — SODIUM POLYSTYRENE SULFONATE 15 GM/60ML PO SUSP
30.0000 g | Freq: Once | ORAL | Status: DC
Start: 2019-11-14 — End: 2019-11-14

## 2019-11-14 MED ORDER — PANTOPRAZOLE SODIUM 40 MG PO TBEC
40.0000 mg | DELAYED_RELEASE_TABLET | Freq: Every morning | ORAL | Status: DC
Start: 2019-11-14 — End: 2019-11-19
  Administered 2019-11-14 – 2019-11-19 (×6): 40 mg via ORAL
  Filled 2019-11-14 (×6): qty 1

## 2019-11-14 MED ORDER — HEPARIN (PORCINE) IN D5W 50-5 UNIT/ML-% IV SOLN (UNITS/KG/HR ONLY)
18.0000 [IU]/kg/h | INTRAVENOUS | Status: DC
Start: 2019-11-14 — End: 2019-11-18
  Administered 2019-11-14: 18 [IU]/kg/h via INTRAVENOUS
  Administered 2019-11-15 – 2019-11-16 (×2): 22 [IU]/kg/h via INTRAVENOUS
  Administered 2019-11-16: 26 [IU]/kg/h via INTRAVENOUS
  Administered 2019-11-17: 28 [IU]/kg/h via INTRAVENOUS
  Administered 2019-11-17: 26 [IU]/kg/h via INTRAVENOUS
  Administered 2019-11-18: 28 [IU]/kg/h via INTRAVENOUS
  Filled 2019-11-14 (×6): qty 500

## 2019-11-14 MED ORDER — HEPARIN SODIUM (PORCINE) 5000 UNIT/ML IJ SOLN
80.0000 [IU]/kg | Freq: Once | INTRAMUSCULAR | Status: AC
Start: 2019-11-14 — End: 2019-11-14
  Administered 2019-11-14: 5950 [IU] via INTRAVENOUS
  Filled 2019-11-14: qty 2

## 2019-11-14 NOTE — Progress Notes (Addendum)
Informed by nursing that pt's LUE duplex results back. Reviewed and positive for occlusive DVT in L subclavian, axillary, and brachial veins, and occlusive superficial thrombus in the left basilic. As currently not in the hospital, have asked nursing to let pt know I will explain more in detail with him tomorrow, but to please tell him that "he has a clot in the large vessels of his arm likely related to the PICC line he had there before. Please tell him that we need to put him on blood thinners to treat this and that we usually start with heparin gtt/Lovenox injections first and then if no signs of bleeding will transition to a pill, so it's not like he has to be on the IV/SC heparin forever. Given his creatinine, it will have to be heparin gtt."    Heparin gtt ordered. Will also order PPI prophylaxis.     Marzella Schlein. Marton Redwood, MD  Internal Medicine Hospitalist

## 2019-11-14 NOTE — UM Notes (Signed)
9/12.  Inpatient CSR.    Most recent VS:      IV Teflaro, q 12    IV Milrinone gtt cont     Most recent VS:  97.3, (no rr), 91, 98 % sats,  122/75.       11/14/19 5:38 AM   Glucose   70 - 100 mg/dL [id number]     Comment: ADA guidelines for diabetes mellitus:   Fasting: Equal to or greater than 126 mg/dL   Random:  Equal to or greater than 200 mg/dL    BUN   9 - 28 mg/dL 57.0High     Creatinine   0.7 - 1.3 mg/dL 2.4High     Sodium   136 - 145 mEq/L 129Low     Potassium   3.5 - 5.1 mEq/L 5.6High     Chloride   100 - 111 mEq/L 107    CO2   22 - 29 mEq/L 13Low     Calcium   8.5 - 10.5 mg/dL 4.5WUJ          10/12/89 5:38 AM   WBC   3.1 - 9.5 x10 3/uL 13.31High     Hgb   12.5 - 17.1 g/dL 4.7WGN     Hematocrit   37.6 - 49.6 % 25.6Low     Platelets   142 - 346 x10 3/uL 161    RBC   4.20 - 5.90 x10 6/uL 3.15Low           Per attending, Dr Marton Redwood,    Assessment/Plan:   Pt is a 44 yo M with 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 Kyung Rudd at [facility] (previously deemed not LVAD/transplant candidate by [facility]). Admitted to CICU given persistent tachycardiaand c/f cardiogenic component, but CICU course suspects symptoms likely 2/2 sepsis alone. PICC removed and cultures sent, midline placed.     # Sepsis 2/2 MRSA bacteremia, POA  Never hypotensive. PICC removed, suspected source as painful swelling at insertion site. BCx at [facility] growing MRSA. Covid neg. No other painful swelling of HA per ICU.  - repeat blood cx 9/10 with 1/2 bottles GPC in clusters - f/u  - f/u cath tip cx 9/11  - repeat blood cx today 9/12  - f/u echo  - F/U LUE duplex  - continue ceftaroline? Unclear why couldn't go on Vanco    # Hyperkalemia  Of note on patiromer Ernesto Rutherford) at home but not on formulary here  - give insulin/D5, albuterol, kayexalate  -  recheck K+ in 4 hrs    # NICM (EF 15-20% JUL2021) on home milrinone  Undergoing transplant eval with Dr. Kyung Rudd (previously deemed not LVAD/transplant candidate by [facility]).   - f/u cards recs  - continue home Toprol, hydralazine, isordil  - d/w home mildrinone 0.25 mcg/kg/min  - c/w home lasix  - f/u echo    # CKD stage 3    # NAGMA - possibly 2/2 diarrhea at home?  - home patiromer not on formulary - kayexlate as above  - tolerating home Lasix    # HA on home prednisone  Currently doing well on prednisone. Pt started 40mg  on 31AUG x10 days with plan to go to 30mg  09SEP x10 days and then 20mg  daily for 10 days.   - prednisone 30mg  daily x10 days (END: 18SEP)   - will taper  to 20mg  daily x10 days starting 19SEP    # Chronic anemia  - continue iron repletion

## 2019-11-14 NOTE — Progress Notes (Signed)
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 Kyung Rudd at [facility] (previously deemed not LVAD/transplant candidate by [facility]). Admitted to CICU given persistent tachycardiaand c/f cardiogenic component, but CICU course suspects symptoms likely 2/2 sepsis alone. PICC removed and cultures sent, midline placed.     # Sepsis 2/2 MRSA bacteremia, POA  Never hypotensive. PICC removed, suspected source as painful swelling at insertion site. BCx at [facility] growing MRSA. Covid neg. No other painful swelling of HA per ICU.  - repeat blood cx 9/10 with 1/2 bottles GPC in clusters - f/u  - f/u cath tip cx 9/11  - repeat blood cx today 9/12  - f/u echo  - F/U LUE duplex  - continue ceftaroline? Unclear why couldn't go on Vanco    # Hyperkalemia  Of note on patiromer Ernesto Rutherford) at home but not on formulary here  - give insulin/D5, albuterol, kayexalate  - recheck K+ in 4 hrs    # NICM (EF 15-20% JUL2021) on home milrinone  Undergoing transplant eval with Dr. Kyung Rudd (previously deemed not LVAD/transplant candidate by [facility]).   - f/u cards recs  - continue home Toprol, hydralazine, isordil  - d/w home mildrinone 0.25 mcg/kg/min  - c/w home lasix  - f/u echo    # CKD stage 3  # NAGMA - possibly 2/2 diarrhea at home?  - home patiromer not on formulary - kayexlate as above  - tolerating home Lasix    # HA on home prednisone  Currently doing well on prednisone. Pt started 40mg  on 31AUG x10 days with plan to go to 30mg  09SEP x10 days and then 20mg  daily for 10 days.   - prednisone 30mg  daily x10 days (END: 18SEP)   - will taper to 20mg  daily x10 days starting 19SEP    # Chronic  anemia  - continue iron repletion    Case discussed with: pt, RN    Safety Checklist:     DVT prophylaxis:  CHEST guideline (See page [id number]) Chemical   Foley:  Surfside Rn Foley protocol Not present   IVs:  Peripheral IV   PT/OT: Ordered   Daily CBC & or Chem ordered:  SHM/ABIM guidelines (see #5) Yes, due to clinical and lab instability   Reference for approximate charges of common labs: CBC auto diff - $76   BMP - $99   Mg - $79    Lines:     Patient Lines/Drains/Airways Status    Active PICC Line / CVC Line / PIV Line / Drain / Airway / Intraosseous Line / Epidural Line / ART Line / Line / Wound / Pressure Ulcer / NG/OG Tube     Name:   Placement date:   Placement time:   Site:   Days:    Peripheral IV 11/12/19 22 G Posterior;Right Hand   11/12/19    1812    Hand   1    Midline IV 11/13/19 Anterior;Right Upper Arm   11/13/19    1714    Upper Arm   less than 1                 Disposition: (Please  see PAF column for Expected D/C Date)   Today's date: 11/14/2019   Admit Date: 11/12/2019  4:44 PM   LOS: 2  Clinical Milestones: stabilization treatment, finalization of treatment plans  Anticipated discharge needs: TBD      Subjective     CC: Cardiomyopathy    Interval History/24 hour events: none    HPI/Subjective: Pt sleepy, states didn't sleep well last night. Denies palpitations, chest pains, other.     Review of Systems:     As per HPI    Physical Exam:     VITAL SIGNS PHYSICAL EXAM   Temp:  [97.7 F (36.5 C)-98.1 F (36.7 C)] 98.1 F (36.7 C)  Heart Rate:  [84-112] 84  Resp Rate:  [19-27] 19  BP: (111-127)/(66-85) 120/68  Blood Glucose:          Intake/Output Summary (Last 24 hours) at 11/14/2019 0811  Last data filed at 11/14/2019 0600  Gross per 24 hour   Intake 941 ml   Output 1825 ml   Net -884 ml    Physical Exam  General: Sleepy but easily arousable, lying in bed in NAD.   Neck: Trachea midline, neck supple, no masses  Cardiovascular: regular rate and rhythm, no murmurs, rubs or gallops  Lungs: Fine crackles to  bilateral bases, no wheezes/rhonchi  Abdomen: soft, non-tender, non-distended; no palpable masses,  normoactive bowel sounds  Extremities: no edema or cyanosis  Skin: No lesions/ulcers/rashes to exposed skin  Vascular: Warm extremities, no signs of hypoperfusoin        Meds:     Medications were reviewed:  Current Facility-Administered Medications   Medication Dose Route Frequency    ceftaroline fosamil  400 mg Intravenous Q12H    ferrous sulfate  324 mg Oral Q48H    furosemide  40 mg Oral Daily    heparin (porcine)  5,000 Units Subcutaneous Q8H SCH    hydrALAZINE  50 mg Oral Q8H SCH    isosorbide dinitrate  30 mg Oral TID - Nitrate Free Interval    metoprolol succinate XL  25 mg Oral Daily    predniSONE  30 mg Oral QAM W/BREAKFAST    senna-docusate  1 tablet Oral QHS     Current Facility-Administered Medications   Medication Dose Route Frequency Last Rate    milrinone  0.25 mcg/kg/min Intravenous Continuous 0.25 mcg/kg/min (11/14/19 0208)     Current Facility-Administered Medications   Medication Dose Route    acetaminophen  650 mg Oral    oxyCODONE  5 mg Oral         Labs:     Labs (last 72 hours):    Recent Labs   Lab 11/14/19  0538 11/13/19  0545   WBC 13.31* 10.67*   Hgb 8.4* 8.0*   Hematocrit 25.6* 24.3*   Platelets 161 125*       Recent Labs   Lab 11/12/19  1807   PT 16.4*   PT INR 1.4*   PTT 30    Recent Labs   Lab 11/14/19  0538 11/13/19  0545 11/13/19  0008 11/12/19  1749   Sodium 129* 130*  More results in Results Review 133*   Potassium 5.6* 5.0  More results in Results Review 3.9   Chloride 107 108  More results in Results Review 109   CO2 13* 14*  More results in Results Review 15*   BUN 57.0* 54.0*  More results in Results Review 54.0*   Creatinine 2.4* 2.3*  More results  in Results Review 2.4*   Calcium 8.1* 8.0*  More results in Results Review 7.8*   Albumin  --   --   --  2.0*   Protein, Total  --   --   --  7.8   Bilirubin, Total  --   --   --  2.3*   Alkaline Phosphatase  --   --   --   182*   ALT  --   --   --  29   AST (SGOT)  --   --   --  41*   Glucose 128* 93  More results in Results Review 96   More results in Results Review = values in this interval not displayed.                   Microbiology, reviewed and are significant for:  Microbiology Results (last 15 days)     Procedure Component Value Units Date/Time    CULTURE CATHETER TIP [[id number]] Collected: 11/13/19 2216    Order Status: Sent Specimen: Catheter Tip, PICC Updated: 11/13/19 2338    CULTURE BLOOD AEROBIC AND ANAEROBIC [[id number]] Collected: 11/12/19 1749    Order Status: Completed Specimen: Blood, Venipuncture Updated: 11/14/19 0437    Narrative:      [id number]_ called Micro Results of_BldCx. Results read back by:[id number], by [id number]  on 11/14/2019 at 01:29  The order will result in two separate 8-43ml bottles  Please do NOT order repeat blood cultures if one has been  drawn within the last 48 hours  UNLESS concerned for  endocarditis  AVOID BLOOD CULTURE DRAWS FROM CENTRAL LINE IF POSSIBLE  Indications:->Bacteremia  ORDER#: [id number]                                    ORDERED BY: Reesa Chew  SOURCE: Blood, Venipuncture                          COLLECTED:  11/12/19 17:49  ANTIBIOTICS AT COLL.:                                RECEIVED :  11/12/19 20:46  [id number]_ called Micro Results of_BldCx. Results read back by:[id number], by [id number] on 11/14/2019 at 01:29  Culture Blood Aerobic and Anaerobic        PRELIM      11/14/19 04:37   +  11/13/19   No Growth after 1 day/s of incubation.  11/14/19   Aerobic Blood Culture Positive in less than 24 hrs             Gram Stain Shows: Gram positive cocci in clusters             Further workup to follow including susceptibility testing               Rapid identification will be performed using the [facility] BCID-GP             Panel. This testing is only performed on the first positive blood             culture per patient with Gram positive bacteria. Please see BCID-GP             Panel(Blood Culture Rapid  Identification: Gram Positive Panel)  results located under Blood Cultures on the Result Review tree.      CULTURE BLOOD AEROBIC AND ANAEROBIC [[id number]] Collected: 11/12/19 1749    Order Status: Completed Specimen: Blood, Venipuncture Updated: 11/13/19 2121    Narrative:      The order will result in two separate 8-77ml bottles  Please do NOT order repeat blood cultures if one has been  drawn within the last 48 hours  UNLESS concerned for  endocarditis  AVOID BLOOD CULTURE DRAWS FROM CENTRAL LINE IF POSSIBLE  Indications:->Bacteremia  ORDER#: [id number]                                    ORDERED BY: Reesa Chew  SOURCE: Blood, Venipuncture                          COLLECTED:  11/12/19 17:49  ANTIBIOTICS AT COLL.:                                RECEIVED :  11/12/19 20:46  Culture Blood Aerobic and Anaerobic        PRELIM      11/13/19 21:21  11/13/19   No Growth after 1 day/s of incubation.      MRSA culture - Nares [[id number]] Collected: 11/12/19 1749    Order Status: Completed Specimen: Culturette from Nares Updated: 11/13/19 1544     Culture MRSA Surveillance Negative for Methicillin Resistant Staph aureus    MRSA culture - Throat [[id number]] Collected: 11/12/19 1749    Order Status: Completed Specimen: Culturette from Throat Updated: 11/13/19 1544     Culture MRSA Surveillance Negative for Methicillin Resistant Staph aureus    Blood Culture Rapid Identification: Gram-Positive Panel [[id number]]  (Abnormal) Collected: 11/12/19 1749    Order Status: Completed Updated: 11/14/19 0436     Interpretation - ID by NAA See Below     Comment: Probable Staphylococcus epidermidis, Methicillin resistant.        Bacillus cereus group ID by NAA Not Detected     Bacillus subtilis group ID by NAA Not Detected     Corynebacterium species ID by NAA Not Detected     Cutibacterium acnes (Propionibacterium acnes) ID by NAA Not Detected     Enterococcus species ID by NAA Not Detected     Enterococcus faecalis ID by NAA  Not Detected     Enterococcus faecium ID by NAA Not Detected     vanA Vancomycin Resistance Gene (Enterococcus Only) ID by NAA N/A     vanB Vancomycin Resistance Gene (Enterococcus Only) ID by NAA N/A     Lactobacillus species ID by NAA Not Detected     Listeria species ID by NAA Not Detected     Listeria monocytogenes ID by NAA Not Detected     Micrococcus species ID by NAA Not Detected     Staphylococcus species ID by NAA Detected     Staphylococcus aureus ID by NAA Not Detected     Staphylococcus epidermidis ID by NAA Detected     Staphylococcus lugdunensis ID by NAA Not Detected     mecA Methicillin Resistance Gene (Staphylococcus Only) ID by NAA Detected     mecC Methicillin Resistance Gene (Staphylococcous Only) ID by NAA Not Detected     Streptococcus species ID by NAA Not Detected  Streptococcus agalactiae (GBS) ID by NAA Not Detected     Streptococcus anginosus group ID by NAA Not Detected     Streptococcus pneumoniae ID by NAA Not Detected     Streptococcus pyogenes (GAS) ID by NAA Not Detected     Candida spp. (C.albicans, C.glabrata, C. krusei, C. parapsilosis) ID by NAA Not Detected     Comment: Species include C. albicans, C.glabrata, C. krusei,  or C. parapsilosis          Pan Gram-Negative ID by NAA Not Detected     Comment: Test performed using the [facility] BCID-GP panel, a qualitative  nucleic acid multiplex assay. Negative results may be due to  the presence of pathogens that are not detected by this test.  Positive results do not rule out co-infection with other  organisms. Nucleic acids may be present in blood culture,  independent of bacterial viability. Detection of an assay  target does not guarantee that the corresponding bacteria are  infectious or are the causative agents for clinical symptoms.  Antimicrobial resistance can occur via multiple mechanisms.  A Not Detected result for antimicrobial resistance gene  targets does not necessarily indicate antimicrobial  susceptibility.          Narrative:      [id number]_ called Micro Results of_BldCx. Results read back by:[id number], by [id number]  on 11/14/2019 at 01:29  The order will result in two separate 8-62ml bottles  Please do NOT order repeat blood cultures if one has been  drawn within the last 48 hours  UNLESS concerned for  endocarditis  AVOID BLOOD CULTURE DRAWS FROM CENTRAL LINE IF POSSIBLE  Indications:->Bacteremia          Imaging, reviewed and are significant for:  No results found.    I spent over 35 minutes in the care of this patient today, discussing w/pt his current medical status, findings, plan, of which greater than 50% of the time was spent counseling and coordinating care.    Signed by: Ernst Breach, MD

## 2019-11-14 NOTE — Progress Notes (Signed)
...  RN Transport Note    Patient ordered for US Venous Up Ex. Bedside report was received from primary nurse on ext [phone]. Patient was identified with arm band per hospital policy. Patient was placed on the stretcher, placed on travel monitor and remained on RA. Patient tolerated transport and procedure without incident. Patient was taken back to CTUN room 315 and placed back in bed and on tele cardiac monitor. Bed in lowest position, fall mats placed back on floor, bed alarm set, and call light within reach. Primary nurse updated at bedside on patient return. All iv's were intact as they were prior to transport. See flowsheet for vital signs.

## 2019-11-14 NOTE — Plan of Care (Addendum)
A&O x 4; VSS, RS-sat WNL; GI/GU cont x 2; Midline R upper arm for Milrinone c/d/i; PIV R hand; high/mod fall; Appropriately used call bell to address needs. Call bell within reach; no bed/chair alarms on-pt refused, bed in lowest position. Fall mat in place. Pt remains safe within the shift. Hourly rounding done, will con't to monitor.  Comment:  -K+ high- treated per orders  -[place] of L showed deep thrombus  Plan:   -start on Heparin gtt.  -anti xA   -monitor lab/electrolytes  -IV ABX  -Milrinone 0.25 mcg/kg/min  -ECHO-complete            Problem: Compromised Tissue integrity  Goal: Damaged tissue is healing and protected  Outcome: Progressing  Goal: Nutritional status is improving  Outcome: Progressing     Problem: Day of Admission - Heart Failure  Goal: Heart Failure Admission  Outcome: Progressing     Problem: Everyday - Heart Failure  Goal: Stable Vital Signs and Fluid Balance  Outcome: Progressing  Goal: Mobility/Activity is Maintained at Optimal Level for Patient  Outcome: Progressing  Goal: Nutritional Intake is Adequate  Outcome: Progressing  Goal: Teaching-Using CHF Warning Zones and Educational Videos  Outcome: Progressing     Problem: Inadequate Cardiac Output  Goal: Adequate tissue perfusion will be maintained  Outcome: Progressing     Problem: Safety  Goal: Patient will be free from injury during hospitalization  Outcome: Progressing  Goal: Patient will be free from infection during hospitalization  Outcome: Progressing     Problem: Pain  Goal: Pain at adequate level as identified by patient  Outcome: Progressing     Problem: Side Effects from Pain Analgesia  Goal: Patient will experience minimal side effects of analgesic therapy  Outcome: Progressing     Problem: Discharge Barriers  Goal: Patient will be discharged home or other facility with appropriate resources  Outcome: Progressing     Problem: Psychosocial and Spiritual Needs  Goal: Demonstrates ability to cope with hospitalization/illness  Outcome:  Progressing     Problem: Moderate/High Fall Risk Score >5  Goal: Patient will remain free of falls  Outcome: Progressing

## 2019-11-15 ENCOUNTER — Inpatient Hospital Stay: Payer: 59

## 2019-11-15 DIAGNOSIS — I82B12 Acute embolism and thrombosis of left subclavian vein: Secondary | ICD-10-CM

## 2019-11-15 DIAGNOSIS — A419 Sepsis, unspecified organism: Secondary | ICD-10-CM

## 2019-11-15 DIAGNOSIS — I82612 Acute embolism and thrombosis of superficial veins of left upper extremity: Secondary | ICD-10-CM

## 2019-11-15 DIAGNOSIS — R519 Headache, unspecified: Secondary | ICD-10-CM

## 2019-11-15 DIAGNOSIS — I82622 Acute embolism and thrombosis of deep veins of left upper extremity: Secondary | ICD-10-CM

## 2019-11-15 DIAGNOSIS — I5020 Unspecified systolic (congestive) heart failure: Secondary | ICD-10-CM

## 2019-11-15 DIAGNOSIS — E872 Acidosis: Secondary | ICD-10-CM

## 2019-11-15 DIAGNOSIS — Z79899 Other long term (current) drug therapy: Secondary | ICD-10-CM

## 2019-11-15 DIAGNOSIS — I82A12 Acute embolism and thrombosis of left axillary vein: Secondary | ICD-10-CM

## 2019-11-15 DIAGNOSIS — I429 Cardiomyopathy, unspecified: Secondary | ICD-10-CM

## 2019-11-15 DIAGNOSIS — D649 Anemia, unspecified: Secondary | ICD-10-CM

## 2019-11-15 LAB — CBC AND DIFFERENTIAL
Absolute NRBC: 0 10*3/uL (ref 0.00–0.00)
Basophils Absolute Automated: 0.02 10*3/uL (ref 0.00–0.08)
Basophils Automated: 0.2 %
Eosinophils Absolute Automated: 0.02 10*3/uL (ref 0.00–0.44)
Eosinophils Automated: 0.2 %
Hematocrit: 26.9 % — ABNORMAL LOW (ref 37.6–49.6)
Hgb: 8.5 g/dL — ABNORMAL LOW (ref 12.5–17.1)
Immature Granulocytes Absolute: 0.13 10*3/uL — ABNORMAL HIGH (ref 0.00–0.07)
Immature Granulocytes: 1 %
Lymphocytes Absolute Automated: 1.14 10*3/uL (ref 0.42–3.22)
Lymphocytes Automated: 8.6 %
MCH: 26.8 pg (ref 25.1–33.5)
MCHC: 31.6 g/dL (ref 31.5–35.8)
MCV: 84.9 fL (ref 78.0–96.0)
MPV: 10.7 fL (ref 8.9–12.5)
Monocytes Absolute Automated: 0.45 10*3/uL (ref 0.21–0.85)
Monocytes: 3.4 %
Neutrophils Absolute: 11.47 10*3/uL — ABNORMAL HIGH (ref 1.10–6.33)
Neutrophils: 86.6 %
Nucleated RBC: 0 /100 WBC (ref 0.0–0.0)
Platelets: 242 10*3/uL (ref 142–346)
RBC: 3.17 10*6/uL — ABNORMAL LOW (ref 4.20–5.90)
RDW: 22 % — ABNORMAL HIGH (ref 11–15)
WBC: 13.23 10*3/uL — ABNORMAL HIGH (ref 3.10–9.50)

## 2019-11-15 LAB — BASIC METABOLIC PANEL
Anion Gap: 9 (ref 5.0–15.0)
BUN: 53 mg/dL — ABNORMAL HIGH (ref 9.0–28.0)
CO2: 14 mEq/L — ABNORMAL LOW (ref 22–29)
Calcium: 7.7 mg/dL — ABNORMAL LOW (ref 8.5–10.5)
Chloride: 109 mEq/L (ref 100–111)
Creatinine: 2.2 mg/dL — ABNORMAL HIGH (ref 0.7–1.3)
Glucose: 118 mg/dL — ABNORMAL HIGH (ref 70–100)
Potassium: 5.8 mEq/L — ABNORMAL HIGH (ref 3.5–5.1)
Sodium: 132 mEq/L — ABNORMAL LOW (ref 136–145)

## 2019-11-15 LAB — ANTI-XA,UFH: Anti-Xa, UFH: <0.04

## 2019-11-15 LAB — GLUCOSE WHOLE BLOOD - POCT: Whole Blood Glucose POCT: 134 mg/dL — ABNORMAL HIGH (ref 70–100)

## 2019-11-15 LAB — PHOSPHORUS: Phosphorus: 3.7 mg/dL (ref 2.3–4.7)

## 2019-11-15 LAB — GFR: EGFR: 39.9

## 2019-11-15 LAB — LACTIC ACID, PLASMA: Lactic Acid: 1.4 mmol/L (ref 0.2–2.0)

## 2019-11-15 MED ORDER — MORPHINE SULFATE 2 MG/ML IJ/IV SOLN (WRAP)
1.0000 mg | Freq: Once | Status: AC
Start: 2019-11-15 — End: 2019-11-15
  Administered 2019-11-15: 1 mg via INTRAVENOUS
  Filled 2019-11-15: qty 1

## 2019-11-15 MED ORDER — SODIUM CHLORIDE 0.9 % IV SOLN
6.0000 mg/kg | INTRAVENOUS | Status: DC
Start: 2019-11-15 — End: 2019-11-19
  Administered 2019-11-15 – 2019-11-19 (×5): 500 mg via INTRAVENOUS
  Filled 2019-11-15 (×5): qty 500

## 2019-11-15 MED ORDER — OXYCODONE HCL 5 MG PO TABS
5.0000 mg | ORAL_TABLET | ORAL | Status: DC | PRN
Start: 2019-11-15 — End: 2019-11-19
  Administered 2019-11-16 – 2019-11-19 (×8): 5 mg via ORAL
  Filled 2019-11-15 (×8): qty 1

## 2019-11-15 NOTE — PT Progress Note (Addendum)
[facility]   Physical Therapy Cancellation Note      Patient:  Eriel Doyon MRN#:  [id number]  Unit:  [facility] Room/Bed:  FI315/FI315-01    11/15/2019    Pt not seen for physical therapy secondary to RN defers due to pain in AM and off unit for [place] in PM. Will continue to follow.    Dionisio David, PT, DPT, MSEd  Pager: [phone]
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
## 2019-11-15 NOTE — Plan of Care (Addendum)
Pt Alert & Oriented X 4. VS WNL. Sinus rhythm on Tele, sating in the high 90s on RA. Pt. Reports R peripheral IV site pain, PRN pain med given.    Milrinone gtt infusing at 0.25 mcg/kg/min.  Heparin gtt infusing per moderate intensity protocol, Next Anti-XA:  12 pm      Safety and fall precautions maintained-- Call bell and overhead table within reach.  Hourly rounding completed. Will continue to monitor.                    Problem: Compromised Tissue integrity  Goal: Damaged tissue is healing and protected  Outcome: Progressing     Problem: Day of Admission - Heart Failure  Goal: Heart Failure Admission  Outcome: Progressing     Problem: Everyday - Heart Failure  Goal: Stable Vital Signs and Fluid Balance  Outcome: Progressing     Problem: Inadequate Cardiac Output  Goal: Adequate tissue perfusion will be maintained  Outcome: Progressing     Problem: Safety  Goal: Patient will be free from injury during hospitalization  Outcome: Progressing

## 2019-11-15 NOTE — Plan of Care (Signed)
Problem: Compromised Tissue integrity  Goal: Damaged tissue is healing and protected  Outcome: Progressing  Goal: Nutritional status is improving  Outcome: Progressing     Problem: Day of Admission - Heart Failure  Goal: Heart Failure Admission  Outcome: Progressing     Problem: Everyday - Heart Failure  Goal: Stable Vital Signs and Fluid Balance  Outcome: Progressing  Goal: Mobility/Activity is Maintained at Optimal Level for Patient  Outcome: Progressing  Goal: Nutritional Intake is Adequate  Outcome: Progressing  Goal: Teaching-Using CHF Warning Zones and Educational Videos  Outcome: Progressing     Problem: Safety  Goal: Patient will be free from injury during hospitalization  Outcome: Progressing  Goal: Patient will be free from infection during hospitalization  Outcome: Progressing     Problem: Pain  Goal: Pain at adequate level as identified by patient  Outcome: Progressing     Problem: Moderate/High Fall Risk Score >5  Goal: Patient will remain free of falls  Outcome: Progressing

## 2019-11-15 NOTE — Progress Notes (Signed)
S: Admitted for dyspnea and weakness.  B: NICM, CKD, on home Milrinone, recurrent skin infections.  A: Alert, oriented, lives with mother in a single level home. Independent with ADL's and ambulation. Lives 4 [place]/2 hours away. Receives home health and Milrinone at home. He is unsure of Milrinone supplier. States [facility] health services are from [facility].  R: TBD     11/15/19 1612   Patient Type   Within 30 Days of Previous Admission? No   Healthcare Decisions   Interviewed: Patient   Orientation/Decision Making Abilities of Patient Alert and Oriented x3, able to make decisions   Advance Directive Patient does not have advance directive   Prior to admission   Prior level of function Independent with ADLs;Ambulates independently   Type of Residence Private residence   Home Layout One level   Have running water, electricity, heat, etc? Yes   Living Arrangements Parent  (Lives with mother)   How do you get to your MD appointments? Self   How do you get your groceries? Self   Who fixes your meals? Self   Who does your laundry? Self   Who picks up your prescriptions? Self   Dressing Independent   Feeding Independent   Bathing Independent   Toileting Independent   Discharge Planning   Support Systems Parent   Patient expects to be discharged to: Home   Anticipated [place] plan discussed with: Same as interviewed   Mode of transportation: Other  (Patient lives 4 1/2 hours away)   Consults/Providers   PT Evaluation Needed 2   OT Evalulation Needed 2   Family and PCP   PCP on file was verified as the current PCP? PCP Unknown/Unable to obtain   Important Message from [facility] Notice   Patient received 1st IMM Letter? n/a     C. Thresa Ross

## 2019-11-15 NOTE — Progress Notes (Addendum)
Shift Note:  Orientation: A/O x 4 ( irritable )   Rhythm on tele: SR on tele , HR:90's   Oxygen/airway: Sating above 95% on RA   Pain: complains of 10/10 pain on his R hand   Lines/Drips: 22 G R hand, 22 G RFA , Midline RUA   Diet: Heart healthy   GI/GU: continent x 2   Ambulation: Independent   Fall Score: High  Fall Risk. High  fall precautions maintained throughout shift.Hourly rounding completed. Belongings and call bell made within reach. Will continue to monitor and assess for changes in pt status.     Comments:   - Pt refused midline dressing change related to the pain on his R hand, refused  12 pm AntiXa Dr. Clydene Fake, made aware.Transplant work up labs ordered, pt refusing to do the lab work related to the pain on his R hand, will make on coming nurse aware.      Plan:   - NPO @ [place] for scheduled ab [place] tomorrow   - Next AnitiXa @ 8pm   - monitor WBC/temp   - continue milrinone currently infusing @ 0.25 mcg/kg/hr   - continue heparin currently infusing @ 22 u/kg/hr... 32.7 ml/hr.   -[place] of abd ordered  - US arterial(graft dopp BLE), US carotid duplex bilateral, Eco, urine culutre, CT chest abd pelvis wo contrast, beside spirometry ordered.

## 2019-11-15 NOTE — Consults (Signed)
CONSULTATION    Date Time: 11/15/19 2:59 PM  Patient Name: [name] [name],[name] [name]  Requesting Physician: Verdene Lennert, MD      Reason for Consultation:   Staph epidermidis bacteremia, s/p MRSA bacteremia    Assessment:   44 year old black male with non ischemic cardiomyopathy, LV thrombus, PE, CKD,severe  hidradenitis suppurativa s/p multiple surgeries/multiple skin infections,  treated with Stelara and now on prednisone, on home Milrinone via PICC    Admitted to [facility] 11/09/19 with fever, tachycardia, myalgias, leukocytosis  MRSA bacteremia  Transferred to [facility] 11/12/19    Bacteremia here 9/10 with Staph epi  S/p removal of PICC and placement of midline 9/11  Culture of catheter tip with both Staph aureus and Staph epi  Extensive thrombus found L subclavian, axillary, brachial veins    On Ceftaroline    Feeling better    F/u blood cultures pending  Echo does not show vegetation    Patient has not been vaccinated for COVID and declines offer of vaccine. Asked him to wear a mask when people are in his room.      Plan:   Will change to Daptomycin - only once/day, narrower spectrum    If f/u blood cultures are negative, would treat x 14 days from date of the first negative culture    Would need surveillance cultures after therapy is finished and prior to placing a new PICC for ongoing milrinone infusion    Thanks for the consult, will [place] [id number]            History:   [name] [name] is a 44 y.o. male who presents to the hospital on 11/12/2019 with worsening SOB, weakness, myalgias. He has a history of heart failure diagnosed 2018, non ischemic cardiomyopathy, LV thrombus, PE, CKD, hidradenitis suppurativa s/p multiple surgeries and treatment with Stelara and prednisone, s/p L gluteal abscess treated with drainage and IV antibiotics 07/2019 at [facility].  He is s/p hospitalization at [facility] this past summer, started on home milrinone.     Not felt to be a heart transplant or LVAD candidate at  [facility] due to frequent severe skin infections.     He was transferred from [facility] ED where he was admitted on 11/09/19 with T 100.6, tachycardia 134, WBC 28, procalcitonin 93, diagnosed with MRSA bacteremia, treated with Ceftazidime/Vancomycin.     Here he has been on Ceftaroline, now day # 4  Old PICC removed 11/13/19  New midline placed 11/13/19    Blood cultures here with Staph epi from 11/12/19    Extensive thrombus found in L subclavian, axillary, brachial veins    Past Medical History:   No past medical history on file.  See above  Past Surgical History:   No past surgical history on file.    Family History:     Family History   Problem Relation Age of Onset    Hypertension Brother        Social History:   Lives with mother and brother in [place]  Social History     Socioeconomic History    Marital status: Single     Spouse name: Not on file    Number of children: Not on file    Years of education: Not on file    Highest education level: Not on file   Occupational History    Not on file   Tobacco Use    Smoking status: Former Smoker  Packs/day: 0.25     Years: 20.00     Pack years: 5.00     Types: Cigarettes     Quit date: 09/06/2019     Years since quitting: 0.1    Smokeless tobacco: Never Used   Vaping Use    Vaping Use: Former   Substance and Sexual Activity    Alcohol use: Not Currently     Alcohol/week: 2.0 standard drinks     Types: 2 Cans of beer per week     Comment: socially    Drug use: Never    Sexual activity: Not on file   Other Topics Concern    Not on file   Social History Narrative    Not on file     Social Determinants of Health     Financial Resource Strain:     Difficulty of Paying Living Expenses:    Food Insecurity:     Worried About Programme researcher, broadcasting/film/video in the Last Year:     Barista in the Last Year:    Transportation Needs:     Freight forwarder (Medical):     Lack of Transportation (Non-Medical):    Physical Activity:     Days of Exercise per Week:      Minutes of Exercise per Session:    Stress:     Feeling of Stress :    Social Connections:     Frequency of Communication with Friends and Family:     Frequency of Social Gatherings with Friends and Family:     Attends Religious Services:     Active Member of Clubs or Organizations:     Attends Banker Meetings:     Marital Status:    Intimate Partner Violence:     Fear of Current or Ex-Partner:     Emotionally Abused:     Physically Abused:     Sexually Abused:        Allergies:     Allergies   Allergen Reactions    Isosorb Dinitrate-Hydralazine Other (See Comments)     Severe Headache  Severe Headache      Sulfamethoxazole-Trimethoprim Itching and Rash     Other reaction(s): Hives  Other reaction(s): Hives      Bacitracin Rash       Medications:     Current Facility-Administered Medications   Medication Dose Route Frequency    ceftaroline fosamil  400 mg Intravenous Q12H    ferrous sulfate  324 mg Oral Q48H    furosemide  40 mg Oral Daily    hydrALAZINE  50 mg Oral Q8H SCH    isosorbide dinitrate  30 mg Oral TID - Nitrate Free Interval    metoprolol succinate XL  25 mg Oral Daily    pantoprazole  40 mg Oral QAM AC    predniSONE  30 mg Oral QAM W/BREAKFAST    senna-docusate  1 tablet Oral QHS       Review of Systems:   A comprehensive review of systems was:     Positive for pain at R hand p IV site    Negative for ongoing LUE pain, fevers, chills, sweats  Negative for SOB, cough, HA, dizziness, chest pain, nausea, vomiting, diarrhea, abdominal pain, dysuria    Physical Exam:     Vitals:    11/15/19 1308   BP: 138/72   Pulse: 95   Resp: 17   Temp: 97.3 F (36.3 C)   SpO2: 98%  WDWN 44 year old black male in NAD  Skin with multiple scars/keloids, tattoos  Neck without adenopathy  Lungs with scattered crackles  Heart RRR  Abdomen soft, non tender, normal bowel sounds  BLE without edema    Lines: R midline 9/11    Labs Reviewed:     Results     Procedure Component Value Units  Date/Time    CULTURE BLOOD AEROBIC AND ANAEROBIC [[id number]] Collected: 11/12/19 1749    Specimen: Blood, Venipuncture Updated: 11/15/19 1230    Narrative:      [id number] called Micro Results of_BldCx. Results read back by:[id number], by [id number]  on 11/14/2019 at 01:29  The order will result in two separate 8-55ml bottles  Please do NOT order repeat blood cultures if one has been  drawn within the last 48 hours  UNLESS concerned for  endocarditis  AVOID BLOOD CULTURE DRAWS FROM CENTRAL LINE IF POSSIBLE  Indications:->Bacteremia  ORDER#: [id number]                                    ORDERED BY: Reesa Chew  SOURCE: Blood, Venipuncture                          COLLECTED:  11/12/19 17:49  ANTIBIOTICS AT COLL.:                                RECEIVED :  11/12/19 20:46  [id number]_ called Micro Results of_BldCx. Results read back by:[id number], by [id number] on 11/14/2019 at 01:29  Culture Blood Aerobic and Anaerobic        PRELIM      11/15/19 12:30   +  11/13/19   No Growth after 1 day/s of incubation.  11/14/19   Aerobic Blood Culture Positive in less than 24 hrs             Gram Stain Shows: Gram positive cocci in clusters               Rapid identification will be performed using the [facility] BCID-GP             Panel. This testing is only performed on the first positive blood             culture per patient with Gram positive bacteria. Please see BCID-GP             Panel(Blood Culture Rapid Identification: Gram Positive Panel)             results located under Blood Cultures on the Result Review tree.  11/15/19   Anaerobic culture no growth to date, final report to follow  11/15/19   Growth of Staphylococcus epidermidis               Possible skin contaminant, susceptibility testing not             performed without request unless additional blood cultures,             collected within 48 hours of this culture, become positive.             Contact the laboratory for further information if necessary.        CULTURE CATHETER TIP  [[id number]] Collected: 11/13/19 2216    Specimen: Catheter Tip, PICC Updated: 11/15/19 1202    Narrative:  ORDER#: [id number]                                    ORDERED BY: SHI, PETER  SOURCE: Catheter Tip, PICC picc                      COLLECTED:  11/13/19 22:16  ANTIBIOTICS AT COLL.:                                RECEIVED :  11/13/19 23:38  Culture Catheter Tip                       PRELIM      11/15/19 12:01   +  11/15/19   <15 CFU Staphylococcus aureus               No further work,             Questionable significance due to low quantity    11/15/19   <15 CFU Staphylococcus epidermidis               No further work,             Questionable significance due to low quantity        Basic Metabolic Panel [[id number]]  (Abnormal) Collected: 11/15/19 0426    Specimen: Blood Updated: 11/15/19 0712     Glucose 118 mg/dL      BUN 62.1 mg/dL      Creatinine 2.2 mg/dL      Calcium 7.7 mg/dL      Sodium 308 mEq/L      Potassium 5.8 mEq/L      Chloride 109 mEq/L      CO2 14 mEq/L      Anion Gap 9.0    Narrative:      Rescheduled by [id number] at 11/15/2019 04:25 Reason: Unable to Scan   Armband/label - use downtime procedure to collect.    Phosphorus [[id number]] Collected: 11/15/19 0426    Specimen: Blood Updated: 11/15/19 9528     Phosphorus 3.7 mg/dL     Narrative:      Rescheduled by [id number] at 11/15/2019 04:25 Reason: Unable to Scan   Armband/label - use downtime procedure to collect.    GFR [[id number]] Collected: 11/15/19 0426     Updated: 11/15/19 0712     EGFR 39.9    Narrative:      Rescheduled by [id number] at 11/15/2019 04:25 Reason: Unable to Scan   Armband/label - use downtime procedure to collect.    CBC and differential [[id number]]  (Abnormal) Collected: 11/15/19 0426    Specimen: Blood Updated: 11/15/19 0508     WBC 13.23 x10 3/uL      Hgb 8.5 g/dL      Hematocrit 25.9 %      Platelets 242 x10 3/uL      RBC 3.17 x10 6/uL      MCV 84.9 fL      MCH 26.8 pg      MCHC 31.6 g/dL      RDW 22 %      MPV 10.7 fL       Neutrophils 86.6 %      Lymphocytes Automated 8.6 %      Monocytes 3.4 %      Eosinophils Automated 0.2 %  Basophils Automated 0.2 %      Immature Granulocytes 1.0 %      Nucleated RBC 0.0 /100 WBC      Neutrophils Absolute 11.47 x10 3/uL      Lymphocytes Absolute Automated 1.14 x10 3/uL      Monocytes Absolute Automated 0.45 x10 3/uL      Eosinophils Absolute Automated 0.02 x10 3/uL      Basophils Absolute Automated 0.02 x10 3/uL      Immature Granulocytes Absolute 0.13 x10 3/uL      Absolute NRBC 0.00 x10 3/uL     Narrative:      Rescheduled by [id number] at 11/15/2019 04:25 Reason: Unable to Scan   Armband/label - use downtime procedure to collect.    Anti-Xa, UFH [[id number]] Collected: 11/15/19 0426     Updated: 11/15/19 0506     Anti-Xa, UFH <0.04    Narrative:      Every 8 hours after initiation and every rate change until  two subsequent values within goal range, then change  frequency to IHS daily at 0400 until heparin is discontinued.  Rescheduled by [id number] at 11/15/2019 04:25 Reason: Unable to Scan   Armband/label - use downtime procedure to collect.    Lactic Acid [[id number]] Collected: 11/15/19 0426    Specimen: Blood Updated: 11/15/19 0445     Lactic Acid 1.4 mmol/L     Narrative:      Rescheduled by [id number] at 11/15/2019 04:25 Reason: Unable to Scan   Armband/label - use downtime procedure to collect.    CULTURE BLOOD AEROBIC AND ANAEROBIC [[id number]] Collected: 11/12/19 1749    Specimen: Blood, Venipuncture Updated: 11/14/19 2121    Narrative:      The order will result in two separate 8-37ml bottles  Please do NOT order repeat blood cultures if one has been  drawn within the last 48 hours  UNLESS concerned for  endocarditis  AVOID BLOOD CULTURE DRAWS FROM CENTRAL LINE IF POSSIBLE  Indications:->Bacteremia  ORDER#: [id number]                                    ORDERED BY: Reesa Chew  SOURCE: Blood, Venipuncture                          COLLECTED:  11/12/19 17:49  ANTIBIOTICS AT COLL.:                                 RECEIVED :  11/12/19 20:46  Culture Blood Aerobic and Anaerobic        PRELIM      11/14/19 21:21  11/13/19   No Growth after 1 day/s of incubation.  11/14/19   No Growth after 2 day/s of incubation.      APTT [[id number]] Collected: 11/14/19 1917     Updated: 11/14/19 1955     PTT 30 sec     Narrative:      Obtain baseline aPTT prior to heparin initiation if not drawn  previously, to evaluate for underlying coagulopathy or  recent non-heparin anticoagulant. exposure  Every 8 hours after initiation and every rate change until  two subsequent values within goal range, then change  frequency to IHS daily at 0400 until heparin is discontinued.    Anti-Xa, UFH [[id number]] Collected: 11/14/19 1917     Updated:  11/14/19 1955     Anti-Xa, UFH <0.04    Narrative:      Obtain baseline aPTT prior to heparin initiation if not drawn  previously, to evaluate for underlying coagulopathy or  recent non-heparin anticoagulant. exposure  Every 8 hours after initiation and every rate change until  two subsequent values within goal range, then change  frequency to IHS daily at 0400 until heparin is discontinued.    CBC without differential [[id number]]  (Abnormal) Collected: 11/14/19 1917    Specimen: Blood Updated: 11/14/19 1941     WBC 14.04 x10 3/uL      Hgb 8.4 g/dL      Hematocrit 40.9 %      Platelets 210 x10 3/uL      RBC 3.11 x10 6/uL      MCV 82.3 fL      MCH 27.0 pg      MCHC 32.8 g/dL      RDW 22 %      MPV 9.8 fL      Nucleated RBC 0.0 /100 WBC      Absolute NRBC 0.00 x10 3/uL     Narrative:      Obtain baseline aPTT prior to heparin initiation if not drawn  previously, to evaluate for underlying coagulopathy or  recent non-heparin anticoagulant. exposure  Every 8 hours after initiation and every rate change until  two subsequent values within goal range, then change  frequency to IHS daily at 0400 until heparin is discontinued.              Rads:   Radiological Procedure reviewed.   9/11 up extrem dopplers 1.  Occlusive deep venous thrombosis of the left subclavian vein,  axillary vein, and brachial veins.  2. Occlusive superficial thrombus in the left basilic vein.      9/10 TTE   * The left ventricle is severely dilated.    * Left ventricular wall thickness is normal.    * Severe left ventricular global hypokinesis.    * Left ventricular systolic function is severely decreased with an ejection  fraction by Biplane Method of Discs of  27 %.    * The right ventricular cavity size is severely dilated.    * Decreased right ventricular systolic function.    * TAPSE = 1.8 cm, S' = 11 cm/s, fractional area change = 21 %.    * The left atrium is severely dilated.    * The right atrium is severely dilated.    * There is mild mitral regurgitation.    * There is mild tricuspid regurgitation.    * No valvular vegetations visualized.    * Severe pulmonary hypertension with estimated right ventricular systolic  pressure of  73 mmHg.    * The ascending aorta is mildly dilated.    * Trivial circumferential pericardial effusion visualized.    * No prior study is available for comparison.    Micro:  9/12 blood pending  9/11 catheter tip Staph aureus and Staph epi  9/10 blood rapid ID probable Staph epi methicillin resistant  9/10 MRSA swabs negative   9/10 blood Staph epi    Signed by: Camillo Flaming Darrian Goodwill

## 2019-11-15 NOTE — Progress Notes (Addendum)
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 Kyung Rudd at [facility] (previously deemed not LVAD/transplant candidate by [facility]). Admitted to CICU given persistent tachycardiaand c/f cardiogenic component, but CICU course suspects symptoms likely 2/2 sepsis alone. PICC removed and cultures sent, midline placed.     Plan:     # Sepsis 2/2 MRSA bacteremia, POA  Never hypotensive. PICC removed, suspected source as painful swelling at insertion site. BCx at [facility] growing MRSA. Covid neg. No other painful swelling of HA per ICU.  - repeat blood cx 9/10 with 1/2 bottles GPC in clusters - staph epi  - f/u cath tip cx 9/11 staph epi  - repeat blood cx today 9/12  - f/u echo  - continue ceftaroline? Unclear why couldn't go on Vanco  -consulted ID Dr. Lenny Pastel    #Left upper extremity DVT-Doppler revealed occlusive DVT of the left subclavian vein, axillary vein and brachial veins, occlusive superficial thrombus in the left basilic vein  -Continue heparin drip  -Obtain right upper extremity Doppler.  Not clear if related to IV   -Pain management with oxycodone as needed    # Hyperkalemia  Of note on patiromer Ernesto Rutherford) at home but not on formulary here  - give insulin/D5, albuterol, kayexalate  - recheck K+ in 4 hrs    # NICM (EF 15-20% JUL2021) on home milrinone  Undergoing transplant eval with Dr. Kyung Rudd (previously deemed not LVAD/transplant candidate by [facility]).   - f/u cards recs  - continue home Toprol, hydralazine, isordil  - d/w home mildrinone 0.25 mcg/kg/min  - c/w home lasix  -Echo reviewed     # CKD stage 3  # NAGMA - possibly 2/2 diarrhea at home?  - home patiromer not on  formulary - kayexlate as above  - tolerating home Lasix    # HA on home prednisone   Currently doing well on prednisone. Pt started 40mg  on 31AUG x10 days with plan to go to 30mg  09SEP x10 days and then 20mg  daily for 10 days.   - prednisone 30mg  daily x10 days (END: 18SEP)   - will taper to 20mg  daily x10 days starting 19SEP    # Chronic anemia  - continue iron repletion    DVT prophylaxis: Heparin drip  Case discussed with: pt, RN        Disposition: (Please see PAF column for Expected D/C Date)   Today's date: 11/15/2019   Admit Date: 11/12/2019  4:44 PM   LOS: 3  Clinical Milestones: Bacteremia  Anticipated discharge needs: TBD      Subjective     CC: Cardiomyopathy    Interval History/24 hour events: No events    HPI/Subjective: Complains of right wrist and forearm pain.  Midline on the right upper extremity.  Given 1 dose of morphine and currently sleepy    Review of Systems:     As per HPI    Physical Exam:     VITAL SIGNS PHYSICAL EXAM   Temp:  [97.3 F (36.3 C)-97.5 F (36.4 C)] 97.3 F (36.3 C)  Heart Rate:  [88-96] 95  Resp Rate:  [16-18] 16  BP: (122-162)/(68-81) 138/72  Blood Glucose:  Intake/Output Summary (Last 24 hours) at 11/15/2019 1329  Last data filed at 11/15/2019 1319  Gross per 24 hour   Intake 1775.22 ml   Output 2110 ml   Net -334.78 ml    Physical Exam  General: Somnolent after morphine   Cardiovascular: regular rate and rhythm, no murmurs, rubs or gallops  Lungs: clear to auscultation bilaterally, without wheezing, rhonchi, or rales  Abdomen: soft, non-tender, non-distended; no palpable masses,  normoactive bowel sounds  Extremities: no edema  Other:   Right upper extremity midline       Meds:     Medications were reviewed:  Current Facility-Administered Medications   Medication Dose Route Frequency    ceftaroline fosamil  400 mg Intravenous Q12H    ferrous sulfate  324 mg Oral Q48H    furosemide  40 mg Oral Daily    hydrALAZINE  50 mg Oral Q8H SCH    isosorbide dinitrate   30 mg Oral TID - Nitrate Free Interval    metoprolol succinate XL  25 mg Oral Daily    pantoprazole  40 mg Oral QAM AC    predniSONE  30 mg Oral QAM W/BREAKFAST    senna-docusate  1 tablet Oral QHS     Current Facility-Administered Medications   Medication Dose Route Frequency Last Rate    heparin infusion 25,000 units/500 mL (VTE/Moderate Intensity)  18 Units/kg/hr Intravenous Continuous 22 Units/kg/hr (11/15/19 1240)    milrinone  0.25 mcg/kg/min Intravenous Continuous 0.25 mcg/kg/min (11/15/19 0717)     Current Facility-Administered Medications   Medication Dose Route    acetaminophen  650 mg Oral    heparin (porcine)  40 Units/kg Intravenous    heparin (porcine)  80 Units/kg Intravenous    oxyCODONE  5 mg Oral         Labs:     Labs (last 72 hours):    Recent Labs   Lab 11/15/19  0426 11/14/19  1917   WBC 13.23* 14.04*   Hgb 8.5* 8.4*   Hematocrit 26.9* 25.6*   Platelets 242 210       Recent Labs   Lab 11/14/19  1917 11/12/19  1807   PT  --  16.4*   PT INR  --  1.4*   PTT 30 30    Recent Labs   Lab 11/15/19  0426 11/14/19  1520 11/13/19  0008 11/12/19  1749   Sodium 132* 133*  More results in Results Review 133*   Potassium 5.8* 4.9  More results in Results Review 3.9   Chloride 109 109  More results in Results Review 109   CO2 14* 14*  More results in Results Review 15*   BUN 53.0* 55.0*  More results in Results Review 54.0*   Creatinine 2.2* 2.3*  More results in Results Review 2.4*   Calcium 7.7* 7.9*  More results in Results Review 7.8*   Albumin  --   --   --  2.0*   Protein, Total  --   --   --  7.8   Bilirubin, Total  --   --   --  2.3*   Alkaline Phosphatase  --   --   --  182*   ALT  --   --   --  29   AST (SGOT)  --   --   --  41*   Glucose 118* 135*  More results in Results Review 96   More results in Results Review = values in this  interval not displayed.                   Microbiology, reviewed and are significant for:  Microbiology Results (last 15 days)     Procedure Component Value  Units Date/Time    CULTURE BLOOD AEROBIC AND ANAEROBIC [[id number]] Collected: 11/14/19 1520    Order Status: Sent Specimen: Blood, Venipuncture Updated: 11/14/19 1741    Narrative:      The order will result in two separate 8-32ml bottles  Please do NOT order repeat blood cultures if one has been  drawn within the last 48 hours  UNLESS concerned for  endocarditis  AVOID BLOOD CULTURE DRAWS FROM CENTRAL LINE IF POSSIBLE  Indications:->Sepsis  Indications:->Bacteremia  Res[place]uled by [id number] at 11/14/2019 15:19 Reason: Printed by   mistake/Printing Issues.  1 BLUE+1 PURPLE    CULTURE BLOOD AEROBIC AND ANAEROBIC [[id number]] Collected: 11/14/19 1520    Order Status: Sent Specimen: Blood, Venipuncture Updated: 11/14/19 1741    Narrative:      The order will result in two separate 8-83ml bottles  Please do NOT order repeat blood cultures if one has been  drawn within the last 48 hours  UNLESS concerned for  endocarditis  AVOID BLOOD CULTURE DRAWS FROM CENTRAL LINE IF POSSIBLE  Indications:->Sepsis  Indications:->Bacteremia  Rescheduled by [id number] at 11/14/2019 15:19 Reason: Printed by   mistake/Printing Issues.  1 BLUE+1 PURPLE    CULTURE CATHETER TIP [[id number]] Collected: 11/13/19 2216    Order Status: Completed Specimen: Catheter Tip, PICC Updated: 11/15/19 1202    Narrative:      ORDER#: [id number]                                    ORDERED BY: SHI, PETER  SOURCE: Catheter Tip, PICC picc                      COLLECTED:  11/13/19 22:16  ANTIBIOTICS AT COLL.:                                RECEIVED :  11/13/19 23:38  Culture Catheter Tip                       PRELIM      11/15/19 12:01   +  11/15/19   <15 CFU Staphylococcus aureus               No further work,             Questionable significance due to low quantity    11/15/19   <15 CFU Staphylococcus epidermidis               No further work,             Questionable significance due to low quantity        CULTURE BLOOD AEROBIC AND ANAEROBIC [[id number]] Collected:  11/12/19 1749    Order Status: Completed Specimen: Blood, Venipuncture Updated: 11/15/19 1230    Narrative:      [id number]_ called Micro Results of_BldCx. Results read back by:[id number], by [id number]  on 11/14/2019 at 01:29  The order will result in two separate 8-73ml bottles  Please do NOT order repeat blood cultures if one has been  drawn within the last 48 hours  UNLESS concerned for  endocarditis  AVOID BLOOD CULTURE DRAWS FROM CENTRAL LINE IF POSSIBLE  Indications:->Bacteremia  ORDER#: [id number]                                    ORDERED BY: Reesa Chew  SOURCE: Blood, Venipuncture                          COLLECTED:  11/12/19 17:49  ANTIBIOTICS AT COLL.:                                RECEIVED :  11/12/19 20:46  [id number]_ called Micro Results of_BldCx. Results read back by:[id number], by [id number] on 11/14/2019 at 01:29  Culture Blood Aerobic and Anaerobic        PRELIM      11/15/19 12:30   +  11/13/19   No Growth after 1 day/s of incubation.  11/14/19   Aerobic Blood Culture Positive in less than 24 hrs             Gram Stain Shows: Gram positive cocci in clusters               Rapid identification will be performed using the [facility] BCID-GP             Panel. This testing is only performed on the first positive blood             culture per patient with Gram positive bacteria. Please see BCID-GP             Panel(Blood Culture Rapid Identification: Gram Positive Panel)             results located under Blood Cultures on the Result Review tree.  11/15/19   Anaerobic culture no growth to date, final report to follow  11/15/19   Growth of Staphylococcus epidermidis               Possible skin contaminant, susceptibility testing not             performed without request unless additional blood cultures,             collected within 48 hours of this culture, become positive.             Contact the laboratory for further information if necessary.        CULTURE BLOOD AEROBIC AND ANAEROBIC [[id number]] Collected: 11/12/19 1749     Order Status: Completed Specimen: Blood, Venipuncture Updated: 11/14/19 2121    Narrative:      The order will result in two separate 8-45ml bottles  Please do NOT order repeat blood cultures if one has been  drawn within the last 48 hours  UNLESS concerned for  endocarditis  AVOID BLOOD CULTURE DRAWS FROM CENTRAL LINE IF POSSIBLE  Indications:->Bacteremia  ORDER#: [id number]                                    ORDERED BY: Reesa Chew  SOURCE: Blood, Venipuncture                          COLLECTED:  11/12/19 17:49  ANTIBIOTICS AT COLL.:  RECEIVED :  11/12/19 20:46  Culture Blood Aerobic and Anaerobic        PRELIM      11/14/19 21:21  11/13/19   No Growth after 1 day/s of incubation.  11/14/19   No Growth after 2 day/s of incubation.      MRSA culture - Nares [[id number]] Collected: 11/12/19 1749    Order Status: Completed Specimen: Culturette from Nares Updated: 11/13/19 1544     Culture MRSA Surveillance Negative for Methicillin Resistant Staph aureus    MRSA culture - Throat [[id number]] Collected: 11/12/19 1749    Order Status: Completed Specimen: Culturette from Throat Updated: 11/13/19 1544     Culture MRSA Surveillance Negative for Methicillin Resistant Staph aureus    Blood Culture Rapid Identification: Gram-Positive Panel [[id number]]  (Abnormal) Collected: 11/12/19 1749    Order Status: Completed Updated: 11/14/19 0436     Interpretation - ID by NAA See Below     Comment: Probable Staphylococcus epidermidis, Methicillin resistant.        Bacillus cereus group ID by NAA Not Detected     Bacillus subtilis group ID by NAA Not Detected     Corynebacterium species ID by NAA Not Detected     Cutibacterium acnes (Propionibacterium acnes) ID by NAA Not Detected     Enterococcus species ID by NAA Not Detected     Enterococcus faecalis ID by NAA Not Detected     Enterococcus faecium ID by NAA Not Detected     vanA Vancomycin Resistance Gene (Enterococcus Only) ID by NAA N/A     vanB  Vancomycin Resistance Gene (Enterococcus Only) ID by NAA N/A     Lactobacillus species ID by NAA Not Detected     Listeria species ID by NAA Not Detected     Listeria monocytogenes ID by NAA Not Detected     Micrococcus species ID by NAA Not Detected     Staphylococcus species ID by NAA Detected     Staphylococcus aureus ID by NAA Not Detected     Staphylococcus epidermidis ID by NAA Detected     Staphylococcus lugdunensis ID by NAA Not Detected     mecA Methicillin Resistance Gene (Staphylococcus Only) ID by NAA Detected     mecC Methicillin Resistance Gene (Staphylococcous Only) ID by NAA Not Detected     Streptococcus species ID by NAA Not Detected     Streptococcus agalactiae (GBS) ID by NAA Not Detected     Streptococcus anginosus group ID by NAA Not Detected     Streptococcus pneumoniae ID by NAA Not Detected     Streptococcus pyogenes (GAS) ID by NAA Not Detected     Candida spp. (C.albicans, C.glabrata, C. krusei, C. parapsilosis) ID by NAA Not Detected     Comment: Species include C. albicans, C.glabrata, C. krusei,  or C. parapsilosis          Pan Gram-Negative ID by NAA Not Detected     Comment: Test performed using the [facility] BCID-GP panel, a qualitative  nucleic acid multiplex assay. Negative results may be due to  the presence of pathogens that are not detected by this test.  Positive results do not rule out co-infection with other  organisms. Nucleic acids may be present in blood culture,  independent of bacterial viability. Detection of an assay  target does not guarantee that the corresponding bacteria are  infectious or are the causative agents for clinical symptoms.  Antimicrobial resistance can occur via multiple mechanisms.  A  Not Detected result for antimicrobial resistance gene  targets does not necessarily indicate antimicrobial  susceptibility.         Narrative:      [id number]_ called Micro Results of_BldCx. Results read back by:[id number], by [id number]  on 11/14/2019 at 01:29  The order will result in  two separate 8-21ml bottles  Please do NOT order repeat blood cultures if one has been  drawn within the last 48 hours  UNLESS concerned for  endocarditis  AVOID BLOOD CULTURE DRAWS FROM CENTRAL LINE IF POSSIBLE  Indications:->Bacteremia          Imaging, reviewed and are significant for:  US Venous Duplex Doppler Arm Left    Result Date: 11/14/2019  1. Occlusive deep venous thrombosis of the left subclavian vein, axillary vein, and brachial veins. 2. Occlusive superficial thrombus in the left basilic vein. Ordering physician, patient's RN, and charge nurse were not reachable to relay these results at the time of dictation. Janina Mayo, MD  11/14/2019 4:36 PM    Echo:  Summary    * The left ventricle is severely dilated.    * Left ventricular wall thickness is normal.    * Severe left ventricular global hypokinesis.    * Left ventricular systolic function is severely decreased with an ejection  fraction by Biplane Method of Discs of  27 %.    * The right ventricular cavity size is severely dilated.    * Decreased right ventricular systolic function.    * TAPSE = 1.8 cm, S' = 11 cm/s, fractional area change = 21 %.    * The left atrium is severely dilated.    * The right atrium is severely dilated.    * There is mild mitral regurgitation.    * There is mild tricuspid regurgitation.    * No valvular vegetations visualized.    * Severe pulmonary hypertension with estimated right ventricular systolic  pressure of  73 mmHg.    * The ascending aorta is mildly dilated.    * Trivial circumferential pericardial effusion visualized.    * No prior study is available for comparison.    Signed by: Verdene Lennert, MD

## 2019-11-16 ENCOUNTER — Inpatient Hospital Stay: Payer: 59

## 2019-11-16 ENCOUNTER — Telehealth: Payer: Self-pay

## 2019-11-16 DIAGNOSIS — I509 Heart failure, unspecified: Secondary | ICD-10-CM

## 2019-11-16 DIAGNOSIS — I82623 Acute embolism and thrombosis of deep veins of upper extremity, bilateral: Secondary | ICD-10-CM

## 2019-11-16 LAB — LIPID PANEL
Cholesterol / HDL Ratio: 3.2
Cholesterol: 83 mg/dL (ref 0–199)
HDL: 26 mg/dL — ABNORMAL LOW (ref 40–9999)
LDL Calculated: 47 mg/dL (ref 0–99)
Triglycerides: 52 mg/dL (ref 34–149)
VLDL Calculated: 10 mg/dL (ref 10–40)

## 2019-11-16 LAB — ECG 12-LEAD
Atrial Rate: 92 {beats}/min
P Axis: 54 degrees
P-R Interval: 162 ms
Q-T Interval: 366 ms
QRS Duration: 86 ms
QTC Calculation (Bezet): 452 ms
R Axis: -33 degrees
T Axis: 107 degrees
Ventricular Rate: 92 {beats}/min

## 2019-11-16 LAB — EPSTEIN-BARR VIRUS VCA, IGG: EBV VCA Ab, IgG: >750 U/mL — ABNORMAL HIGH

## 2019-11-16 LAB — HEMOGLOBIN A1C
Average Estimated Glucose: 93.9 mg/dL
Hemoglobin A1C: 4.9 % (ref 4.6–5.9)

## 2019-11-16 LAB — COMPREHENSIVE METABOLIC PANEL
ALT: 36 U/L (ref 0–55)
AST (SGOT): 27 U/L (ref 5–34)
Albumin/Globulin Ratio: 0.4 — ABNORMAL LOW (ref 0.9–2.2)
Albumin: 2.1 g/dL — ABNORMAL LOW (ref 3.5–5.0)
Alkaline Phosphatase: 154 U/L — ABNORMAL HIGH (ref 38–106)
Anion Gap: 9 (ref 5.0–15.0)
BUN: 48 mg/dL — ABNORMAL HIGH (ref 9.0–28.0)
Bilirubin, Total: 0.9 mg/dL (ref 0.2–1.2)
CO2: 15 mEq/L — ABNORMAL LOW (ref 22–29)
Calcium: 7.7 mg/dL — ABNORMAL LOW (ref 8.5–10.5)
Chloride: 110 mEq/L (ref 100–111)
Creatinine: 2.2 mg/dL — ABNORMAL HIGH (ref 0.7–1.3)
Globulin: 5.6 g/dL — ABNORMAL HIGH (ref 2.0–3.6)
Glucose: 82 mg/dL (ref 70–100)
Potassium: 4.5 mEq/L (ref 3.5–5.1)
Protein, Total: 7.7 g/dL (ref 6.0–8.3)
Sodium: 134 mEq/L — ABNORMAL LOW (ref 136–145)

## 2019-11-16 LAB — RAPID DRUG SCREEN, URINE
Barbiturate Screen, UR: NEGATIVE
Benzodiazepine Screen, UR: NEGATIVE
Cannabinoid Screen, UR: NEGATIVE
Cocaine, UR: NEGATIVE
Opiate Screen, UR: POSITIVE — AB
PCP Screen, UR: NEGATIVE
Urine Amphetamine Screen: NEGATIVE

## 2019-11-16 LAB — ANTI-XA,UFH
Anti-Xa, UFH: <0.04
Anti-Xa, UFH: 0.42

## 2019-11-16 LAB — RUBELLA ANTIBODY, IGG: Rubella AB, IgG: 4.35

## 2019-11-16 LAB — MICROALBUMIN, RANDOM URINE
Urine Creatinine, Random: 63.6 mg/dL
Urine Microalbumin, Random: 84 — ABNORMAL HIGH (ref 0.0–30.0)
Urine Microalbumin/Creatinine Ratio: 132 ug/mg — ABNORMAL HIGH (ref 0–30)

## 2019-11-16 LAB — SYPHILIS SCREEN IGG AND IGM: Syphilis Screen IgG and IgM: NONREACTIVE

## 2019-11-16 LAB — HEPATITIS B SURFACE ANTIGEN W/ REFLEX TO CONFIRMATION: Hepatitis B Surface Antigen: NONREACTIVE

## 2019-11-16 LAB — CBC
Absolute NRBC: 0 10*3/uL (ref 0.00–0.00)
Hematocrit: 25.2 % — ABNORMAL LOW (ref 37.6–49.6)
Hgb: 8 g/dL — ABNORMAL LOW (ref 12.5–17.1)
MCH: 26.7 pg (ref 25.1–33.5)
MCHC: 31.7 g/dL (ref 31.5–35.8)
MCV: 84 fL (ref 78.0–96.0)
MPV: 10.1 fL (ref 8.9–12.5)
Nucleated RBC: 0 /100 WBC (ref 0.0–0.0)
Platelets: 289 10*3/uL (ref 142–346)
RBC: 3 10*6/uL — ABNORMAL LOW (ref 4.20–5.90)
RDW: 22 % — ABNORMAL HIGH (ref 11–15)
WBC: 9.8 10*3/uL — ABNORMAL HIGH (ref 3.10–9.50)

## 2019-11-16 LAB — LACTIC ACID, PLASMA: Lactic Acid: 0.7 mmol/L (ref 0.2–2.0)

## 2019-11-16 LAB — URIC ACID: Uric acid: 11.3 mg/dL — ABNORMAL HIGH (ref 3.6–9.7)

## 2019-11-16 LAB — MUMPS ANTIBODY, IGG: Mumps Ab, IgG: 89

## 2019-11-16 LAB — PSA: Prostate Specific Antigen, Total: 0.926 ng/mL (ref 0.000–4.000)

## 2019-11-16 LAB — TSH: TSH: 3.85 u[IU]/mL (ref 0.35–4.94)

## 2019-11-16 LAB — ABO/RH: ABO Rh: O POS

## 2019-11-16 LAB — HEPATITIS B SURFACE ANTIBODY: HEPATITIS B SURFACE ANTIBODY: <3.31

## 2019-11-16 LAB — HEPATITIS B CORE ANTIBODY, TOTAL: Hepatitis B Core Total AB: NONREACTIVE

## 2019-11-16 LAB — HEPATITIS C ANTIBODY: Hepatitis C, AB: NONREACTIVE

## 2019-11-16 LAB — RUBEOLA ANTIBODY IGG: Rubeola (Measles), IgG: 114

## 2019-11-16 LAB — GFR: EGFR: 39.9

## 2019-11-16 LAB — VARICELLA ZOSTER ANTIBODY, IGG: Varicella, IgG: 766.8

## 2019-11-16 LAB — PREALBUMIN: Prealbumin: 18 mg/dL (ref 18.0–45.0)

## 2019-11-16 LAB — ANTIBODY SCREEN: AB Screen Gel: NEGATIVE

## 2019-11-16 LAB — CYTOMEGALOVIRUS ANTIBODY, IGG: CMV AB, IgG: 0.81 U/mL — ABNORMAL HIGH

## 2019-11-16 LAB — SOLID ORGAN TYPING LR CLASS I

## 2019-11-16 LAB — LACTATE DEHYDROGENASE: LDH: 129 U/L (ref 125–331)

## 2019-11-16 LAB — C-REACTIVE PROTEIN: C-Reactive Protein: 3.7 mg/dL — ABNORMAL HIGH (ref 0.0–0.8)

## 2019-11-16 LAB — SOLID ORGAN HLA ANTIBODY SCREEN

## 2019-11-16 LAB — B-TYPE NATRIURETIC PEPTIDE: B-Natriuretic Peptide: 1056 pg/mL — ABNORMAL HIGH (ref 0–100)

## 2019-11-16 NOTE — Progress Notes (Addendum)
PROGRESS NOTE    Date Time: 11/16/19 8:45 AM  Patient Name: [name],[name]    Antibiotics:   # 2 Daptomycin    S/p Ceftaroline    Heparin, milrinone drips      Lines:   R midlind 9/11    Assessment:   44 year old black male with non ischemic cardiomyopathy, LV thrombus, PE, CKD,severe  hidradenitis suppurativa s/p multiple surgeries/multiple skin infections,  treated with Stelara and now on prednisone, on home Milrinone via PICC    Admitted to [facility] 11/09/19 with fever, tachycardia, myalgias, leukocytosis  MRSA bacteremia from 11/09/19, Vancomycin MIC 2 (per verbal report from Micro at [facility])  Transferred to FX 11/12/19    Bacteremia here 9/10 with Staph epi  S/p removal of PICC and placement of midline 9/11  Culture of catheter tip with both Staph aureus and Staph epi  Extensive thrombus found L subclavian, axillary, brachial veins  Superficial and Deep venous thrombosis R cephalic, R basilic veins    Patient has not been vaccinated for COVID and declines offer of vaccine. Asked him to wear a mask when people are in his room.    Plan:   Will continue Daptomycin  Would not use Vancomycin due to elevated MIC    If f/u blood cultures are negative, would treat x 14 days from date of the first negative culture    Would need surveillance cultures after therapy is finished and prior to placing a new PICC for ongoing milrinone infusion    Severe hidradenitis, no active infections now  [place] [id number]      Subjective:   Patient says he wants to go home    Decreased LUE pain  Has no further fevers, chills, has no N/V/D with antibiotics        Review of Systems:     No c/o HA, dizziness, itching, rash, SOB, CP, cough, nausea, vomiting, diarrhea, constipation, abdominal pain, dysuria, joint pain    Physical Exam:     Vitals:    11/16/19 0837   BP: 140/79   Pulse: 94   Resp: 18   Temp: 97.7 F (36.5 C)   SpO2: 99%       WDWN 45 year old black male in NAD  Skin with  no rash seen, tattoos  Multiple keloid scars  Chest clear to auscultation, no crackles heard  Heart regular, no murmurs heard  Abdomen soft, non tender, normal bowel sounds  BLE without edema        Labs:     No results for input(s): WBC, HGB, HCT, PLT in the last 24 hours.      Recent CMP No results for input(s): GLU, BUN, CREAT, NA, K, CL, CO2 in the last 24 hours.  Microbiology:   9/12 blood NGTD  9/11 catheter tip Staph aureus and Staph epi  9/10 blood rapid ID probable Staph epi methicillin resistant  9/10 MRSA swabs negative   9/10 blood Staph epi    9/7 blood MRSA, Vancomycin MIC 2 [facility] hospital, verbal report)      Rads:   Radiological Procedure reviewed.    9/14 abd sono 1. Partially contracted gallbladder without cholelithiasis or  cholecystitis.  2. Echogenic kidneys compatible with medical renal disease.    9/14 carotid dopplers 1. No signal plaque in the right carotid bulb and proximal internal  carotid artery with no hemodynamically significant stenosis.  2. Minimal plaque in the left  carotid bulb and proximal internal carotid  artery with less than 50% and no hemodynamically significant stenosis.    9/14 arterial studies No arterial insufficiency of the lower extremities at rest with  unremarkable noninvasive studies.      CT Chest Abdomen Pelvis WO Contrast    Result Date: 11/16/2019   CT of the chest shows minimal left basilar atelectasis. The lungs are otherwise clear. The examination shows an unremarkable appearance of the thoracic aorta. Note the limitations of this study which was performed without contrast. Cardiomegaly is noted. CT scan of the abdomen and pelvis shows minimal pelvic ascites of uncertain etiology. No acute appearing bowel abnormalities are seen. The examination shows mild body wall edema. A normal-appearing appendix is visualized. [name], MD  11/16/2019 4:33 AM    US Venous Duplex Doppler Arm Right    Result Date: 11/15/2019  1. Superficial and deep venous thrombosis right  upper extremity as above. 2. Nonocclusive thrombus left innominate vein. 3. Urgent findings discussed with Dr. Verdene Lennert at 4 PM on 11/15/2019. Clide Cliff, MD  11/15/2019 3:59 PM    9/11 up extrem dopplers 1. Occlusive deep venous thrombosis of the left subclavian vein,  axillary vein, and brachial veins.  2. Occlusive superficial thrombus in the left basilic vein.      9/10 TTE * The left ventricle is severely dilated.  * Left ventricular wall thickness is normal.  * Severe left ventricular global hypokinesis.  * Left ventricular systolic function is severely decreased with an ejection  fraction by Biplane Method of Discs of 27 %.  * The right ventricular cavity size is severely dilated.  * Decreased right ventricular systolic function.  * TAPSE = 1.8 cm, S' = 11 cm/s, fractional area change = 21 %.  * The left atrium is severely dilated.  * The right atrium is severely dilated.  * There is mild mitral regurgitation.  * There is mild tricuspid regurgitation.  * No valvular vegetations visualized.  * Severe pulmonary hypertension with estimated right ventricular systolic  pressure of 73 mmHg.  * The ascending aorta is mildly dilated.  * Trivial circumferential pericardial effusion visualized.  * No prior study is available for comparison.    Signed by: Camillo Flaming Lot Medford

## 2019-11-16 NOTE — Progress Notes (Signed)
Advanced Heart Failure and Transplant Progress Note    Heart Failure/Transplant Spectra Link [id number]    Listed for transplant:  No.    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%, on home milrinone   Acute on chronic systolic heart failure   Prior LV thrombus 2018; resolved   PE   CKD, baseline creatinine ~2.2   Hidradenitis suppurativa; on home prednisone   MRSA bacteremia, suspected from PICC line (removed 11/13/19); blood cultures and PICC line culture tip positive for Staph epidermidis    Leukocytosis   Anemia of chronic disease   LUE DVTs; ultrasound 11/14/19 revealed occlusive DVT of L subclavian vein, axillary vein, and brachial veins, occlusive superficial thrombus in L basilic vein   Hyponatremia   Acute pain to RUE; improving today    Recommendations:     Metoprolol XL 25 mg daily, Hydralazine 50 mg PO Q8, Isosorbide dinitrate 30 mg TID   Milrinone infusion at 0.25 mcg/kg/min   Furosemide 40 mg PO daily   On Prednisone 30 mg daily for hidradenitis suppurativa   On heparin drip for bilateral UE DVT   Antibiotic management per primary medicine team   Follow up blood cultures, repeat sent 11/14/19   Pain management per primary medicine team.     Patient was recently admitted at [facility] and evaluated for advance therapies, but was declined due to recurrent infections secondary to hidradenitis suppurativa.  Here for second opinion.   LVAD/heart transplant evaluation underway    Mylinda Latina, ACNP-BC   Heart Failure/Heart Transplant Nurse Practitioner     -----------------------------------------------------------------------------------------  Subjective:  Very frustrated and tearful this AM and would like to go home.  New RUE DVT found yesterday.     Objective:    Vital signs in last 24 hours:   Temp:  [97.3 F (36.3 C)-97.7 F (36.5 C)] 97.7 F (36.5 C)  Heart Rate:  [91-99] 94  Resp Rate:  [16-18] 18  BP: (138-158)/(72-90) 140/79  SpO2: 99 %O2 Device: None (Room air)  Height: 185.4 cm (6'  1")  Weight: 74.5 kg (164 lb 3.2 oz); Weight change: 0.408 kg (14.4 oz)  Body mass index is 21.66 kg/m..      I/O last 3 completed shifts:  In: 1805.18 [P.O.:1344; I.V.:361.18; IV Piggyback:100]  Out: 3010 [Urine:3010]                       Drips:   heparin infusion 25,000 units/500 mL (VTE/Moderate Intensity) 22 Units/kg/hr (11/16/19 0411)    milrinone 0.25 mcg/kg/min (11/15/19 2233)     Physical Exam:   General: well-developed, alert, NAD  Eyes: Anicteric sclera, no erythema  ENT:  JVP ~ 6 cm H2O, no carotid bruits. Carotid pulses are 2+ bilaterally  Lungs: CTA B/L, no rhonchi, wheezes or crackles with normal rate and effort  Cardiovascular: s1 s2, normal rate, regular rhythm, no m/r/g, PMI laterally displaced  Abdominal: soft, NT/ND, +B.S., no HSM.   Extremities: warm to touch, no cyanosis, trace edema, peripheral pulses are 2+ bilaterally.  Neuromuscular exam: grossly non-focal, though not formally tested.    Cardiac Studies:    Echocardiogram 11/14/19:  Summary  * The left ventricle is severely dilated.  * Left ventricular wall thickness is normal.  * Severe left ventricular global hypokinesis.  * Left ventricular systolic function is severely decreased with an ejection  fraction by Biplane Method of Discs of 27 %.  * The right ventricular cavity size is severely dilated.  * Decreased  right ventricular systolic function.  * TAPSE = 1.8 cm, S' = 11 cm/s, fractional area change = 21 %.  * The left atrium is severely dilated.  * The right atrium is severely dilated.  * There is mild mitral regurgitation.  * There is mild tricuspid regurgitation.  * No valvular vegetations visualized.  * Severe pulmonary hypertension with estimated right ventricular systolic  pressure of 73 mmHg.  * The ascending aorta is mildly dilated.  * Trivial circumferential pericardial effusion visualized.  * No prior study is available for comparison.      Right Heart Catheterization  09/22/19:  RA 23  mmHg, PCWP 33 mmHg, PAP 55/41(46) mmHg, thermo CO/CI 3.13/1.7, Fick CO/CI 2.72/1.48, PA sat 39.3%.    09/15/19:  The patient was found to have elevated filling pressures on dobutamine infusion.  RA 16, PA 58/26/38 , PCWP 30, Fick CO 4.27 and CI 2.3 , Thermal CO 4.93 and CI 2.65 , TPG 8, PVR 1.9    Imaging:  [place] RUE 11/15/19: 1. Superficial and deep venous thrombosis right upper extremity as above. 2. Nonocclusive thrombus left innominate vein.      CT chest/abdomen/pelvis 11/16/19: CT of the chest shows minimal left basilar atelectasis. The lungs are otherwise clear. The examination shows an unremarkable appearance of the thoracic aorta. Note the limitations of this study which was performed without contrast. Cardiomegaly is noted. CT scan of the abdomen and pelvis shows minimal pelvic ascites of uncertain etiology. No acute appearing bowel abnormalities are seen. The examination shows mild body wall edema. A normal-appearing appendix is visualized.    Abdominal US 11/16/19: 1. Partially contracted gallbladder without cholelithiasis or cholecystitis. 2. Echogenic kidneys compatible with medical renal disease.    Carotid US 11/16/19: 1. No signal plaque in the right carotid bulb and proximal internal carotid artery with no hemodynamically significant stenosis. 2. Minimal plaque in the left carotid bulb and proximal internal carotid artery with less than 50% and no hemodynamically significant stenosis.    DAPTOmycin (CUBICIN) IVPB, 6 mg/kg, Intravenous, Q24H  ferrous sulfate, 324 mg, Oral, Q48H  furosemide, 40 mg, Oral, Daily  hydrALAZINE, 50 mg, Oral, Q8H SCH  isosorbide dinitrate, 30 mg, Oral, TID - Nitrate Free Interval  metoprolol succinate XL, 25 mg, Oral, Daily  pantoprazole, 40 mg, Oral, QAM AC  predniSONE, 30 mg, Oral, QAM W/BREAKFAST  senna-docusate, 1 tablet, Oral, QHS      Lab Results   Component Value Date    WBC 13.23 (H) 11/15/2019    HGB 8.5 (L) 11/15/2019    PLT 242 11/15/2019    NA 132 (L) 11/15/2019     K 5.8 (H) 11/15/2019    BUN 53.0 (H) 11/15/2019    CREAT 2.2 (H) 11/15/2019    EGFR 39.9 11/15/2019    MG 2.0 11/14/2019    AST 41 (H) 11/12/2019    ALB 2.0 (L) 11/12/2019    INR 1.4 (H) 11/12/2019

## 2019-11-16 NOTE — UM Notes (Signed)
9/14 CSR    Level of Care: Cardiac Tele    ID Note:       44 year old black male with non ischemic cardiomyopathy, LV thrombus, PE, CKD,severehidradenitis suppurativa s/p multiple surgeries/multiple skin infections, treated with Stelara and now onprednisone, on home Milrinone via PICC    Admitted to [facility] 11/09/19 with fever, tachycardia, myalgias, leukocytosis  MRSA bacteremia from 11/09/19, Vancomycin MIC 2 (per verbal report from Micro at [facility])  Transferred to FX 11/12/19    Bacteremia here 9/10 with Staph epi  S/p removal of PICC and placement of midline 9/11  Culture of catheter tip with both Staph aureus and Staph epi  Extensive thrombus found L subclavian, axillary, brachial veins  Superficial and Deep venous thrombosis R cephalic, R basilic veins    VS: 97.7, 92, 18, 158/100        Meds:  Daptomycin 500mg  iv q 24 hr, Heparin drip, Primacor drip        Plan:   Will continue Daptomycin  Would not use Vancomycin due to elevated MIC    If f/u blood cultures are negative, would treat x 14 days from date of the first negative culture    Would need surveillance cultures after therapy is finished and prior to placing a new PICC for ongoing milrinone infusion    Severe hidradenitis, no active infections now  Prednisone being weaned  Internal Med Note:      persistent tachycardiaand c/f cardiogenic component, but CICU course suspects symptoms likely 2/2 sepsis alone. PICC removed and cultures sent, midline placed.     Plan:     # Sepsis 2/2 MRSA bacteremia, POA  Never hypotensive. PICC removed, suspected source as painful swelling at insertion site. BCx at [facility] growing MRSA. Covid neg. No other painful swelling of HA per ICU.  -repeat blood cx 9/10 with 1/2 bottles GPC in clusters - staph epi  - f/u cath tip cx 9/11 staph epi  - repeat blood cx today 9/12  - f/u echo  - seen by ID and changed to daptoi    #Left upper extremity DVT-Doppler revealed occlusive DVT of the  left subclavian vein, axillary vein and brachial veins, occlusive superficial thrombus in the left basilic vein.  +RUE DVT  -Continue heparin drip  -heme eval-will most likely need hypercoag workup  -pt denies dvt but hx of PE as pr records   -Pain management with oxycodone as needed    # Hyperkalemia  Of note onpatiromer(Ventassa) at home but not on formulary here  - give insulin/D5, albuterol, kayexalate  - recheck K+ in 4 hrs    #NICM (EF 15-20% [id number])on home milrinone  Undergoing transplant eval with Dr. Larae Grooms deemed not LVAD/transplant candidate by [facility]).  - f/u cards recs  - continue home Toprol, hydralazine, isordil  - d/w home mildrinone 0.25 mcg/kg/min  -LVAD and transplant workup as inpt  - c/w home lasix  -Echo reviewed     # CKD stage 3  # NAGMA - possibly 2/2 diarrhea at home?  -home patiromer not on formulary- kayexlate as above  - tolerating home Lasix    # HA on home prednisone   Currently doing well on prednisone. Pt started 40mg  on 31AUG x10 days with plan to go to 30mg  09SEP x10 days and then 20mg  daily for 10 days.   - prednisone 30mg  daily x10 days (END: 18SEP)   - will taper to 20mg  daily x10 days starting 19SEP    #  Chronic anemia  - continue iron repletion    DVT prophylaxis: Heparin drip  Case discussed with:pt, RN, Pt's mom        Disposition: (Please see PAF column for Expected D/C Date)   Today's date: 11/16/2019   Admit Date: 11/12/2019  4:44 PM   LOS: 4  Clinical Milestones: Bacteremia  Anticipated discharge needs: TBD      Kendrick Ranch  RN/CM  [facility]  Utilization Review Nurse  Cell # [phone]  Office # [phone]

## 2019-11-16 NOTE — Plan of Care (Signed)
Problem: Compromised Tissue integrity  Goal: Damaged tissue is healing and protected  Outcome: Progressing  Goal: Nutritional status is improving  Outcome: Progressing     Problem: Day of Admission - Heart Failure  Goal: Heart Failure Admission  Outcome: Progressing     Problem: Everyday - Heart Failure  Goal: Stable Vital Signs and Fluid Balance  Outcome: Progressing  Goal: Mobility/Activity is Maintained at Optimal Level for Patient  Outcome: Progressing  Goal: Nutritional Intake is Adequate  Outcome: Progressing  Goal: Teaching-Using CHF Warning Zones and Educational Videos  Outcome: Progressing     Problem: Inadequate Cardiac Output  Goal: Adequate tissue perfusion will be maintained  Outcome: Progressing     Problem: Safety  Goal: Patient will be free from injury during hospitalization  Outcome: Progressing  Goal: Patient will be free from infection during hospitalization  Outcome: Progressing     Problem: Pain  Goal: Pain at adequate level as identified by patient  Outcome: Progressing     Problem: Side Effects from Pain Analgesia  Goal: Patient will experience minimal side effects of analgesic therapy  Outcome: Progressing     Problem: Discharge Barriers  Goal: Patient will be discharged home or other facility with appropriate resources  Outcome: Progressing     Problem: Psychosocial and Spiritual Needs  Goal: Demonstrates ability to cope with hospitalization/illness  Outcome: Progressing     Problem: Moderate/High Fall Risk Score >5  Goal: Patient will remain free of falls  Outcome: Progressing   Pt name/Code: [name] [name] (44 y.o. male);Full Code  Admit Date/Dx: 11/12/2019 Cardiomyopathy  Weights: Last 3 Weights for the past 72 hrs (Last 3 readings):   Weight   11/16/19 0424 74.5 kg (164 lb 3.2 oz)   11/15/19 0700 74.6 kg (164 lb 6.4 oz)   11/14/19 1100 74.3 kg (163 lb 11.2 oz)       Shift comment:  Patient completed his ultrasound in the morning, which confirmed clots in both arms. His heparin is  attthe upper limit because of subtherapeutic Anti Xa. Labs were drawn via his midline successfully to his satisfaction.  He hope to go home tomorrow.    Neuro: AOx 4  Pain: WAS generalized after his ultrasound, but resolved.  He had a headache which I was able to relived with an accupressure techniques to the web of is left hand.  Rhythm on Tele: NSR  Oxygen/Airway: RA  GI:  Continent y/n Last BM Date: 11/16/19  GU: Continent y  Diet: Regualr diet  IV Access: Midline and peripheral iv to right forearm  Falls Risk: High  Ambulation: None  Plan: Hematology labs in am. Possible discharge on oral anticoagulant  Diuresis ? PO Lasix  Shift Skin Assessment:  Bony Areas Redness?  Y/N Blanchable? Y/N  If no- place WOCN consult Open?  Y/N Diameter  if applicable Comments   ? ? ? EARS  ? ? ?   Ear (R) ?n ? ? ? ?   Ear (L) ?n ? ? ? ?   ? ? ? ELBOWS  ? ? ?   Elbow (R) ?n ? ? ? ?   Elbow (L) ?n ? ? ? ?   ? ? ? HIPS  ? ? ?   Hip (R) ?n ? ? ? ?   Hip (L) ?n ? ? ? ?   Sacrum ?n ? ? ? ?   ? ? ? HEELS ? ?   Heel (R) ?n ? ? ? ?   Heel (  L) ?n ? ? ? ?   ? ? ? DEVICES ? ?   Underneath ? ? ? ? ?   Above ? ? ? ? ?     Recent Labs   Lab 11/16/19  1359   Creatinine 2.2*         Recent Labs   Lab 11/16/19  1359   Potassium 4.5        Recent Labs   Lab 11/14/19  0538   Magnesium 2.0

## 2019-11-16 NOTE — Consults (Signed)
HEMATOLOGY    CONSULT NOTE         Date Time: 11/16/19 10:21 AM  Patient Name: [name] [name]  Requesting Physician: Verdene Lennert, MD       Reason for Consultation:   Bilateral UE DVT    Assessment:   Bilateral upper extremity DVT, suspect line provoked - currently on heparin gtt   LUE DVT - left subclavian vein, axillary vein and brachial veins, occlusive superficial thrombus in the left basilic vein   RUE DVT - nonocclusive thrombus in right axillary vein. There is occlusive thrombus in portions of right cephalic vein in the right basilic vein     Sepsis secondary to MRSA Bacteremia, MRSA  Nonischemic cardiomyopathy on home milrinone currently undergoing transplant eval     Recommendations:   Cont medical management per primary team  Agree with heparin gtt  May transition to Eliquis for discharge planning  Will complete full hypercoagulable evaluation as pt is currently undergoing workup for LVAD vs transplant.     We will follow, please call with any questions or concerns    Case reviewed with Dr Carney Bern    History:   [name] [name] is a 44 y.o. male with PMH of nonischemic cardiomyopathy, LV thrombus, PE, CKD, severe hidradenitis suppurative s/p multiple surgeries on  pred taper is who presents to the hospital on 11/12/2019 with worsening shortness of breath, weakness, myalgias and fever, transferred from [facility] with MRSA bacteremia for further cardiology evaluation for possible LVAD vs transplant. Pt denies known family history of bleeding or clotting disorders. He initially denies prior VTE event but then recalls when prompted that he has had a LV thrombus but does not know what AC medication he took or for how long. He denies prior history of DVT or PE despite documentation in his record of prior PE. Hematology consulted regarding hyper coag eval.     Past Medical History:   No past medical history on file.    Past Surgical History:   No past surgical history on file.        Family History:      Family History   Problem Relation Age of Onset    Hypertension Brother        Social History:     Social History     Socioeconomic History    Marital status: Single     Spouse name: Not on file    Number of children: Not on file    Years of education: Not on file    Highest education level: Not on file   Occupational History    Not on file   Tobacco Use    Smoking status: Former Smoker     Packs/day: 0.25     Years: 20.00     Pack years: 5.00     Types: Cigarettes     Quit date: 09/06/2019     Years since quitting: 0.1    Smokeless tobacco: Never Used   Vaping Use    Vaping Use: Former   Substance and Sexual Activity    Alcohol use: Not Currently     Alcohol/week: 2.0 standard drinks     Types: 2 Cans of beer per week     Comment: socially    Drug use: Never    Sexual activity: Not on file   Other Topics Concern    Not on file   Social History Narrative    Not on file     Social Determinants of Health  Financial Resource Strain:     Difficulty of Paying Living Expenses:    Food Insecurity:     Worried About Programme researcher, broadcasting/film/video in the Last Year:     Barista in the Last Year:    Transportation Needs:     Freight forwarder (Medical):     Lack of Transportation (Non-Medical):    Physical Activity:     Days of Exercise per Week:     Minutes of Exercise per Session:    Stress:     Feeling of Stress :    Social Connections:     Frequency of Communication with Friends and Family:     Frequency of Social Gatherings with Friends and Family:     Attends Religious Services:     Active Member of Clubs or Organizations:     Attends Banker Meetings:     Marital Status:    Intimate Partner Violence:     Fear of Current or Ex-Partner:     Emotionally Abused:     Physically Abused:     Sexually Abused:        Allergies:     Allergies   Allergen Reactions    Isosorb Dinitrate-Hydralazine Other (See Comments)     Severe Headache  Severe Headache       Sulfamethoxazole-Trimethoprim Itching and Rash     Other reaction(s): Hives  Other reaction(s): Hives      Bacitracin Rash       Medications:     Current Facility-Administered Medications   Medication Dose Route Frequency    DAPTOmycin (CUBICIN) IVPB  6 mg/kg Intravenous Q24H    ferrous sulfate  324 mg Oral Q48H    furosemide  40 mg Oral Daily    hydrALAZINE  50 mg Oral Q8H SCH    isosorbide dinitrate  30 mg Oral TID - Nitrate Free Interval    metoprolol succinate XL  25 mg Oral Daily    pantoprazole  40 mg Oral QAM AC    predniSONE  30 mg Oral QAM W/BREAKFAST    senna-docusate  1 tablet Oral QHS       Review of Systems:   HEMATOLOGY FOCUSED ROS:    General  no fever, chills, night sweats, generalized weakness or fatigue.   Heme: No bleeding, bruising, rash, or swelling in the neck, underarms or groin.  No pain or swelling in upper or lower extremities.  All other systems were reviewed and were negative except as indicated in the HPI.      Physical Exam:   Temp (24hrs), Avg:97.5 F (36.4 C), Min:97.3 F (36.3 C), Max:97.7 F (36.5 C)    Vitals:    11/16/19 1000   BP: (!) 168/96   Pulse: 91   Resp:    Temp:    SpO2: 100%       General appearance -  no distress, depressed, HS scarring to the face  Mental status - alert, oriented to person, place, and time   Eyes - sclerae anicteric, slight conjunctival pallor   Mouth - mucous membranes moist   Abdomen - soft, nontender, nondistended, no masses or organomegaly   Extremities - no pedal edema, no clubbing or cyanosis  Skin - without ecchymoses, petechiae or rash  Neurologic- without localizing deficit      Labs Reviewed:     Results     Procedure Component Value Units Date/Time    Anti-Xa, UFH [[id number]] Collected: 11/16/19 0802  Updated: 11/16/19 1010     Anti-Xa, UFH <0.04    B-type Natriuretic Peptide [[id number]]  (Abnormal) Collected: 11/16/19 0802    Specimen: Blood Updated: 11/16/19 0923     B-Natriuretic Peptide 1,056 pg/mL     Narrative:       Rescheduled by [id number] at 11/16/2019 07:54 Reason: printer error    Rapid drug screen, urine [[id number]]  (Abnormal) Collected: 11/16/19 0802    Specimen: Urine Updated: 11/16/19 0911     Urine Amphetamine Screen Negative     Barbiturate Screen, UR Negative     Benzodiazepine Screen, UR Negative     Cannabinoid Screen, UR Negative     Cocaine, UR Negative     Opiate Screen, UR Positive     PCP Screen, UR Negative    Narrative:      Rescheduled by [id number] at 11/16/2019 07:54 Reason: printer error    CBC without differential [[id number]]  (Abnormal) Collected: 11/16/19 0802    Specimen: Blood Updated: 11/16/19 0857     WBC 9.80 x10 3/uL      Hgb 8.0 g/dL      Hematocrit 66.4 %      Platelets 289 x10 3/uL      RBC 3.00 x10 6/uL      MCV 84.0 fL      MCH 26.7 pg      MCHC 31.7 g/dL      RDW 22 %      MPV 10.1 fL      Nucleated RBC 0.0 /100 WBC      Absolute NRBC 0.00 x10 3/uL     Narrative:      Q48H  Rescheduled by [id number] at 11/16/2019 07:54 Reason: printer error    Urine Nicotine and Cotinine [[id number]] Collected: 11/16/19 0802     Updated: 11/16/19 0850    Narrative:      Rescheduled by [id number] at 11/16/2019 07:54 Reason: printer error  Rescheduled by [id number] at 11/16/2019 07:54 Reason: printer error  Random urine/ No preservative.    Solid Organ HLA Antibody Screen [[id number]] Collected: 11/16/19 0802     Updated: 11/16/19 0849     Solid Organ HLA Antibody Screen Collection Comp    Narrative:      Please draw 2 red tops and 2 yellow tops and tube them to  station [id number]  Rescheduled by [id number] at 11/16/2019 07:54 Reason: printer error  labels not available    Urine Microalbumin Random [[id number]] Collected: 11/16/19 0802     Updated: 11/16/19 0822    Narrative:      Rescheduled by [id number] at 11/16/2019 07:54 Reason: printer error  Rescheduled by [id number] at 11/16/2019 07:54 Reason: printer error    Hemoglobin A1C [[id number]] Collected: 11/16/19 0802    Specimen: Blood Updated: 11/16/19 0822    Narrative:      Rescheduled by [id number] at  11/16/2019 07:54 Reason: printer error  Rescheduled by [id number] at 11/16/2019 07:54 Reason: printer error    CULTURE CATHETER TIP [[id number]] Collected: 11/13/19 2216    Specimen: Catheter Tip, PICC Updated: 11/16/19 0807    Narrative:      ORDER#: [id number]                                    ORDERED BY: SHI, PETER  SOURCE: Catheter Tip, PICC picc  COLLECTED:  11/13/19 22:16  ANTIBIOTICS AT COLL.:                                RECEIVED :  11/13/19 23:38  Culture Catheter Tip                       FINAL       11/16/19 08:07   +  11/15/19   <15 CFU Staphylococcus aureus               No further work,             Questionable significance due to low quantity    11/15/19   <15 CFU Staphylococcus epidermidis               No further work,             Questionable significance due to low quantity        CULTURE BLOOD AEROBIC AND ANAEROBIC [[id number]] Collected: 11/12/19 1749    Specimen: Blood, Venipuncture Updated: 11/15/19 2121    Narrative:      The order will result in two separate 8-6ml bottles  Please do NOT order repeat blood cultures if one has been  drawn within the last 48 hours  UNLESS concerned for  endocarditis  AVOID BLOOD CULTURE DRAWS FROM CENTRAL LINE IF POSSIBLE  Indications:->Bacteremia  ORDER#: [id number]                                    ORDERED BY: Reesa Chew  SOURCE: Blood, Venipuncture                          COLLECTED:  11/12/19 17:49  ANTIBIOTICS AT COLL.:                                RECEIVED :  11/12/19 20:46  Culture Blood Aerobic and Anaerobic        PRELIM      11/15/19 21:21  11/13/19   No Growth after 1 day/s of incubation.  11/14/19   No Growth after 2 day/s of incubation.  11/15/19   No Growth after 3 day/s of incubation.      Glucose Whole Blood - POCT [[id number]]  (Abnormal) Collected: 11/15/19 2046     Updated: 11/15/19 2058     Whole Blood Glucose POCT 134 mg/dL     CULTURE BLOOD AEROBIC AND ANAEROBIC [[id number]] Collected: 11/14/19 1520    Specimen: Blood,  Venipuncture Updated: 11/15/19 1821    Narrative:      The order will result in two separate 8-44ml bottles  Please do NOT order repeat blood cultures if one has been  drawn within the last 48 hours  UNLESS concerned for  endocarditis  AVOID BLOOD CULTURE DRAWS FROM CENTRAL LINE IF POSSIBLE  Indications:->Sepsis  Indications:->Bacteremia  ORDER#: [id number]                                    ORDERED BY: LO, ANGELA  SOURCE: Blood, Venipuncture  COLLECTED:  11/14/19 15:20  ANTIBIOTICS AT COLL.:                                RECEIVED :  11/14/19 17:41  Culture Blood Aerobic and Anaerobic        PRELIM      11/15/19 18:21  11/15/19   No Growth after 1 day/s of incubation.      CULTURE BLOOD AEROBIC AND ANAEROBIC [[id number]] Collected: 11/14/19 1520    Specimen: Blood, Venipuncture Updated: 11/15/19 1821    Narrative:      The order will result in two separate 8-50ml bottles  Please do NOT order repeat blood cultures if one has been  drawn within the last 48 hours  UNLESS concerned for  endocarditis  AVOID BLOOD CULTURE DRAWS FROM CENTRAL LINE IF POSSIBLE  Indications:->Sepsis  Indications:->Bacteremia  ORDER#: [id number]                                    ORDERED BY: LO, ANGELA  SOURCE: Blood, Venipuncture                          COLLECTED:  11/14/19 15:20  ANTIBIOTICS AT COLL.:                                RECEIVED :  11/14/19 17:41  Culture Blood Aerobic and Anaerobic        PRELIM      11/15/19 18:21  11/15/19   No Growth after 1 day/s of incubation.            Rads:     Radiology Results (24 Hour)     Procedure Component Value Units Date/Time    US Abdomen Complete [[id number]] Resulted: 11/16/19 1016    Order Status: Sent Updated: 11/16/19 1016    US Carotid Duplex Dopp Comp Bilateral [[id number]] Resulted: 11/16/19 0902    Order Status: Sent Updated: 11/16/19 1016    [place] Noninvas Low Extrem Art Dopp/Press/Wavefrms (PVR) Comp 3-4 Levels [[id number]] Resulted: 11/16/19 0903    Order Status: No  result Updated: 11/16/19 1015    CT Chest Abdomen Pelvis WO Contrast [[id number]] Collected: 11/16/19 0400    Order Status: Completed Updated: 11/16/19 0435    Narrative:      HISTORY: heart transplant eval. 44 year old male. Preop evaluation.    COMPARISON: There is no relevant study with which to compare.    TECHNIQUE: CT CHEST ABDOMEN PELVIS WO CONTRAST: Helical imaging was  performed on a multislice scanner without oral contrast. IV contrast was  not given for this examination. Axial scans were obtained from the  thoracic inlet through the symphysis pubis. Coronal and sagittal  reconstruction: MPR. Axial thick section MIP images of the chest were  also produced. The following dose reduction techniques were utilized:  automated exposure control and/or adjustment of the mA and/or kV  according to patient's size, and the use of iterative reconstruction  technique.    FINDINGS: Minimal subsegmental atelectasis is noted in the left base.  The lungs are otherwise clear. There is no definite pneumonia or CHF.  The lungs show no suspicious nodules or masses. The thoracic aorta shows  no significant aneurysmal dilatation. The aortic arch is left-sided with  normal branching pattern  and no visible intimal calcifications. No  coronary artery calcifications are appreciated. Moderate cardiomegaly is  noted. No pneumothorax, pleural effusion, or significant pericardial  effusion is seen. There is no evidence of mediastinal hemorrhage or  pneumomediastinum. The trachea and central bronchi are patent. There is  no hilar or mediastinal adenopathy. No axillary adenopathy or chest wall  lesions are identified. The bony thorax and surrounding bony structures  appear intact. No rib fractures are identified.    There is no hiatal hernia. The liver, spleen, pancreas, adrenal glands,  and kidneys show no apparent masses. Note the limitations of this study  which was done without IV contrast. The gallbladder and bile ducts  are  unremarkable. The gallbladder is contracted. The portal vein and other  vascular structures cannot be evaluated due to the lack of IV contrast.  There is no hydronephrosis. No renal calculi are seen. There is no  ureteral dilatation and no definite ureteral calculi are seen. There is  no CT evidence of pancreatitis. The abdominal aorta is calcified but not  aneurysmal. No hemorrhage or bulky retroperitoneal adenopathy is seen.  No definite bowel abnormalities are seen. A normal-appearing appendix is  visualized. There is no bowel dilatation or definite wall thickening.  There is no evidence of bowel obstruction. There is no CT evidence of  colitis or diverticulitis. No omental or mesenteric abnormality is seen.  There is no free intraperitoneal air.  Minimal pelvic ascites is seen.  The prostate gland is not enlarged. There is no pelvic sidewall  adenopathy or other definite pelvic mass. The urinary bladder is  unremarkable. Mild induration of the subcutaneous tissues of the  abdominal wall is noted. The abdominal wall soft tissues are otherwise  unremarkable. Minimal degenerative changes are noted in the spine. No  neoplastic appearing bone lesions are identified.      Impression:       CT of the chest shows minimal left basilar atelectasis. The  lungs are otherwise clear. The examination shows an unremarkable  appearance of the thoracic aorta. Note the limitations of this study  which was performed without contrast. Cardiomegaly is noted.    CT scan of the abdomen and pelvis shows minimal pelvic ascites of  uncertain etiology. No acute appearing bowel abnormalities are seen. The  examination shows mild body wall edema. A normal-appearing appendix is  visualized.    [name], MD   11/16/2019 4:33 AM    US Venous Duplex Doppler Arm Right [[id number]] Collected: 11/15/19 1537    Order Status: Completed Updated: 11/15/19 1601    Narrative:      HISTORY: Arm pain.    COMPARISON: Left upper extremity venous Doppler  yesterday.    TECHNIQUE:  Duplex evaluation of right upper extremity veins was obtained.    FINDINGS:  There is nonocclusive thrombus in right axillary vein. There is  occlusive thrombus in portions of right cephalic vein in the right  basilic vein. The other visualized right upper extremity veins  demonstrate normal color and Doppler flow, and normal compressibility  when technically feasible. Right midline present. There is nonocclusive  thrombus noted in left innominate vein.      Impression:          1. Superficial and deep venous thrombosis right upper extremity as  above.  2. Nonocclusive thrombus left innominate vein.  3. Urgent findings discussed with Dr. Verdene Lennert at 4 PM on 11/15/2019.     Clide Cliff, MD   11/15/2019 3:59 PM  Signed by:     Armida Sans PA-C  HEMATOLOGY  ATTENDING:  Patient seen and examined. Agree with the findings and formulations as presented by PA Natale Milch, with edits added.    Kimberlee Nearing Carney Bern, MD  HEMATOLOGY

## 2019-11-16 NOTE — Progress Notes (Signed)
Air cabin crew note:  Pt atox3, stable was taken to ultrasound for heart transplant workup, pt on the heparing drip at 22u/kg/hr and milrinone gtt at 0.101mcg/kg/hr, see flowsheet for vs, pt tolerated all [place] w/o any events, he was brought back to the room, placed back on the monitor, back to the bed, verbal report was given to bedside RN who took over care, all IV's are intact in lace with drips, bed in low position, call bell within reach, +floor matt intact in place for pt's safety, pt is stable at this time.

## 2019-11-16 NOTE — Telephone Encounter (Signed)
Patient Name: [name] [name]  DOB: [birth year]  MRN: [id number]  Ins: Medicaid   Sample Medication Costs for After a Heart Transplant     Medication Dosage Quantity Cost w/o Insurance Cost for Generic Additional information   *Tacrolimus    1mg  120 $189.30 $0.00    *Tacrolimus 0.5mg  60 $129.00 $0.00    *Prednisone 5mg  60 $23.42 $0.00    *Mycophenolate 500mg  180 $126.35 $0.00    Magnesium Oxide 400mg  240 $28.35 $0.00       (over-the-counter)        Furosemide  20mg  30 $9.95 $0.00    Clotrimazole Lozenge 10mg  120 $211.35 $0.00 PA required           Valganciclovir 450mg  60 $324.50   $0.00 Need to take for at least 6 months   Pravastatin Sodium 20mg  30 $30.94 $0.00    Potassium ERchloride ER 30 $31.61     $0.00    Chloride ER        Amlodipine  10mg  60 $9.95 $0.00    Atovaquone (liquid) 21 $1,035.00 $0.00 PA required   Famotidine (Pepcid over-the-counter)  40mg  30 $19.80 $0.00    Eliquis 5mg  60 $615.07 $0.00    Lisinopril   10mg  60 $11.09 $0.00    Mavyret (brand only)  Medication needed if you have a donor who is Hep C positive 100-40 mg 84 $15,843.00   Copay for brand    $0.00 PA required; must fill via [facility]   Epclusa  Medication needed if you have a donor who is Hep C positive 400-100 mg 28 $9,603.00   $0.00 PA required; must fill via [facility] pharmacy   Estimated cost for a 30 day supply ~$3,000  If you accept a Hep C donor, you will either be on Mavyret (preferred by physician) or Epclusa.  Estimated additional cost for a 30 day supply ~$14,000 to $16,000     Your estimated cost for a 30 day supply TOTALS: Cost w/o hep C medication    $0.00 Cost w/hep C medication    $0.00

## 2019-11-16 NOTE — Plan of Care (Signed)
A&O x4. VSS. Pt states 8-10/10 pain BUE, managing with PRN oxycodone. Pt refusing midline dressing change d/t pain, educated pt on policy & risk for infection. Pt refusing lab draws (including anti-xa, transplant labs, & AM labs). RN educated pt on the importance of the labs at 2030, 0030, & 0420, pt denied lab draws each time. Pt stated too painful for labs to be drawn. NP Elly Modena aware.       Safety precautions maintained throughout shift. Bed in low position, call light within reach. Floor mat present. Pt refusing bed alarm, calls appropriately. Hourly rounding done. Needs met at this time.     --milrinone gtt @ 0.9mcg/kg/hr   --heparin gtt @ 22u/kg/hr   --IV abx: daptomycin Q24hr   --diuresis with PO lasix 40 mg daily   --prednisone taper   --repeat blood cxs sent 9/12, pending results   --needs urine culture & transplant labs    --NPO for abd [place]   --US arterial/graft duplex BLE, US carotid duplex bilateral, ECHO, bedside spirometry   --LVAD/heart transplant w/u       Problem: Compromised Tissue integrity  Goal: Damaged tissue is healing and protected  Outcome: Progressing  Goal: Nutritional status is improving  Outcome: Progressing     Problem: Everyday - Heart Failure  Goal: Stable Vital Signs and Fluid Balance  Outcome: Progressing  Goal: Mobility/Activity is Maintained at Optimal Level for Patient  Outcome: Progressing  Goal: Nutritional Intake is Adequate  Outcome: Progressing  Goal: Teaching-Using CHF Warning Zones and Educational Videos  Outcome: Progressing     Problem: Inadequate Cardiac Output  Goal: Adequate tissue perfusion will be maintained  Outcome: Progressing     Problem: Safety  Goal: Patient will be free from injury during hospitalization  Outcome: Progressing  Goal: Patient will be free from infection during hospitalization  Outcome: Progressing     Problem: Pain  Goal: Pain at adequate level as identified by patient  Outcome: Progressing     Problem: Side Effects from Pain  Analgesia  Goal: Patient will experience minimal side effects of analgesic therapy  Outcome: Progressing     Problem: Psychosocial and Spiritual Needs  Goal: Demonstrates ability to cope with hospitalization/illness  Outcome: Progressing     Problem: Moderate/High Fall Risk Score >5  Goal: Patient will remain free of falls  Outcome: Progressing

## 2019-11-16 NOTE — Progress Notes (Signed)
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 Kyung Rudd at [facility] (previously deemed not LVAD/transplant candidate by [facility]). Admitted to CICU given persistent tachycardiaand c/f cardiogenic component, but CICU course suspects symptoms likely 2/2 sepsis alone. PICC removed and cultures sent, midline placed.     Plan:     # Sepsis 2/2 MRSA bacteremia, POA  Never hypotensive. PICC removed, suspected source as painful swelling at insertion site. BCx at [facility] growing MRSA. Covid neg. No other painful swelling of HA per ICU.  - repeat blood cx 9/10 with 1/2 bottles GPC in clusters - staph epi  - f/u cath tip cx 9/11 staph epi  - repeat blood cx today 9/12  - f/u echo  - seen by ID and changed to daptoi    #Left upper extremity DVT-Doppler revealed occlusive DVT of the left subclavian vein, axillary vein and brachial veins, occlusive superficial thrombus in the left basilic vein.  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 Kyung Rudd (previously deemed not LVAD/transplant candidate by [facility]).   - f/u cards recs  - continue home Toprol, hydralazine, isordil  - d/w home mildrinone 0.25 mcg/kg/min  -LVAD and transplant workup as inpt  - c/w home lasix  -Echo reviewed     # CKD stage 3  # NAGMA - possibly 2/2 diarrhea at  home?  - home patiromer not on formulary - kayexlate as above  - tolerating home Lasix    # HA on home prednisone   Currently doing well on prednisone. Pt started 40mg  on 31AUG x10 days with plan to go to 30mg  09SEP x10 days and then 20mg  daily for 10 days.   - prednisone 30mg  daily x10 days (END: 18SEP)   - will taper to 20mg  daily x10 days starting 19SEP    # Chronic anemia  - continue iron repletion    DVT prophylaxis: Heparin drip  Case discussed with: pt, RN, Pt's mom        Disposition: (Please see PAF column for Expected D/C Date)   Today's date: 11/16/2019   Admit Date: 11/12/2019  4:44 PM   LOS: 4  Clinical Milestones: Bacteremia  Anticipated discharge needs: TBD      Subjective     CC: Cardiomyopathy    Interval History/24 hour events: No events    HPI/Subjective: Denies upper extremity pain.  Wanting to go home.      Review of Systems:     As per HPI    Physical Exam:     VITAL SIGNS PHYSICAL EXAM   Temp:  [97.4 F (36.3 C)-97.7 F (36.5 C)] 97.7 F (36.5 C)  Heart Rate:  [91-99] 92  Resp Rate:  [16-18] 18  BP: (140-168)/(74-100) 142/86  Blood Glucose:  Intake/Output Summary (Last 24 hours) at 11/16/2019 1323  Last data filed at 11/16/2019 0802  Gross per 24 hour   Intake 1627.81 ml   Output 1850 ml   Net -222.19 ml    Physical Exam  General: AO*3   Cardiovascular: regular rate and rhythm, no murmurs, rubs or gallops  Lungs: clear to auscultation bilaterally, without wheezing, rhonchi, or rales  Abdomen: soft, non-tender, non-distended; no palpable masses,  normoactive bowel sounds  Extremities: no edema  Other:   Right upper extremity midline       Meds:     Medications were reviewed:  Current Facility-Administered Medications   Medication Dose Route Frequency    DAPTOmycin (CUBICIN) IVPB  6 mg/kg Intravenous Q24H    ferrous sulfate  324 mg Oral Q48H    furosemide  40 mg Oral Daily    hydrALAZINE  50 mg Oral Q8H SCH    isosorbide dinitrate  30 mg Oral TID - Nitrate Free Interval     metoprolol succinate XL  25 mg Oral Daily    pantoprazole  40 mg Oral QAM AC    predniSONE  30 mg Oral QAM W/BREAKFAST    senna-docusate  1 tablet Oral QHS     Current Facility-Administered Medications   Medication Dose Route Frequency Last Rate    heparin infusion 25,000 units/500 mL (VTE/Moderate Intensity)  18 Units/kg/hr Intravenous Continuous 26 Units/kg/hr (11/16/19 1206)    milrinone  0.25 mcg/kg/min Intravenous Continuous 0.25 mcg/kg/min (11/15/19 2233)     Current Facility-Administered Medications   Medication Dose Route    acetaminophen  650 mg Oral    heparin (porcine)  40 Units/kg Intravenous    heparin (porcine)  80 Units/kg Intravenous    oxyCODONE  5 mg Oral         Labs:     Labs (last 72 hours):    Recent Labs   Lab 11/16/19  0802 11/15/19  0426   WBC 9.80* 13.23*   Hgb 8.0* 8.5*   Hematocrit 25.2* 26.9*   Platelets 289 242       Recent Labs   Lab 11/14/19  1917 11/12/19  1807   PT  --  16.4*   PT INR  --  1.4*   PTT 30 30    Recent Labs   Lab 11/15/19  0426 11/14/19  1520 11/13/19  0008 11/12/19  1749   Sodium 132* 133*  More results in Results Review 133*   Potassium 5.8* 4.9  More results in Results Review 3.9   Chloride 109 109  More results in Results Review 109   CO2 14* 14*  More results in Results Review 15*   BUN 53.0* 55.0*  More results in Results Review 54.0*   Creatinine 2.2* 2.3*  More results in Results Review 2.4*   Calcium 7.7* 7.9*  More results in Results Review 7.8*   Albumin  --   --   --  2.0*   Protein, Total  --   --   --  7.8   Bilirubin, Total  --   --   --  2.3*   Alkaline Phosphatase  --   --   --  182*   ALT  --   --   --  29   AST (SGOT)  --   --   --  41*   Glucose 118* 135*  More results in Results Review 96   More results in Results Review = values in this interval  not displayed.                   Microbiology, reviewed and are significant for:  Microbiology Results (last 15 days)     Procedure Component Value Units Date/Time    CULTURE BLOOD AEROBIC AND  ANAEROBIC [[id number]] Collected: 11/14/19 1520    Order Status: Sent Specimen: Blood, Venipuncture Updated: 11/14/19 1741    Narrative:      The order will result in two separate 8-55ml bottles  Please do NOT order repeat blood cultures if one has been  drawn within the last 48 hours  UNLESS concerned for  endocarditis  AVOID BLOOD CULTURE DRAWS FROM CENTRAL LINE IF POSSIBLE  Indications:->Sepsis  Indications:->Bacteremia  Rescheduled by [id number] at 11/14/2019 15:19 Reason: Printed by   mistake/Printing Issues.  1 BLUE+1 PURPLE    CULTURE BLOOD AEROBIC AND ANAEROBIC [[id number]] Collected: 11/14/19 1520    Order Status: Sent Specimen: Blood, Venipuncture Updated: 11/14/19 1741    Narrative:      The order will result in two separate 8-35ml bottles  Please do NOT order repeat blood cultures if one has been  drawn within the last 48 hours  UNLESS concerned for  endocarditis  AVOID BLOOD CULTURE DRAWS FROM CENTRAL LINE IF POSSIBLE  Indications:->Sepsis  Indications:->Bacteremia  Rescheduled by [id number] at 11/14/2019 15:19 Reason: Printed by   mistake/Printing Issues.  1 BLUE+1 PURPLE    CULTURE CATHETER TIP [[id number]] Collected: 11/13/19 2216    Order Status: Completed Specimen: Catheter Tip, PICC Updated: 11/15/19 1202    Narrative:      ORDER#: [id number]                                    ORDERED BY: SHI, PETER  SOURCE: Catheter Tip, PICC picc                      COLLECTED:  11/13/19 22:16  ANTIBIOTICS AT COLL.:                                RECEIVED :  11/13/19 23:38  Culture Catheter Tip                       PRELIM      11/15/19 12:01   +  11/15/19   <15 CFU Staphylococcus aureus               No further work,             Questionable significance due to low quantity    11/15/19   <15 CFU Staphylococcus epidermidis               No further work,             Questionable significance due to low quantity        CULTURE BLOOD AEROBIC AND ANAEROBIC [[id number]] Collected: 11/12/19 1749    Order Status: Completed Specimen:  Blood, Venipuncture Updated: 11/15/19 1230    Narrative:      [id number]_ called Micro Results of_BldCx. Results read back by:[id number], by [id number]  on 11/14/2019 at 01:29  The order will result in two separate 8-86ml bottles  Please do NOT order repeat blood cultures if one has been  drawn within the last 48 hours  UNLESS concerned for  endocarditis  AVOID  BLOOD CULTURE DRAWS FROM CENTRAL LINE IF POSSIBLE  Indications:->Bacteremia  ORDER#: [id number]                                    ORDERED BY: Reesa Chew  SOURCE: Blood, Venipuncture                          COLLECTED:  11/12/19 17:49  ANTIBIOTICS AT COLL.:                                RECEIVED :  11/12/19 20:46  [id number]_ called Micro Results of_BldCx. Results read back by:[id number], by [id number] on 11/14/2019 at 01:29  Culture Blood Aerobic and Anaerobic        PRELIM      11/15/19 12:30   +  11/13/19   No Growth after 1 day/s of incubation.  11/14/19   Aerobic Blood Culture Positive in less than 24 hrs             Gram Stain Shows: Gram positive cocci in clusters               Rapid identification will be performed using the [facility] BCID-GP             Panel. This testing is only performed on the first positive blood             culture per patient with Gram positive bacteria. Please see BCID-GP             Panel(Blood Culture Rapid Identification: Gram Positive Panel)             results located under Blood Cultures on the Result Review tree.  11/15/19   Anaerobic culture no growth to date, final report to follow  11/15/19   Growth of Staphylococcus epidermidis               Possible skin contaminant, susceptibility testing not             performed without request unless additional blood cultures,             collected within 48 hours of this culture, become positive.             Contact the laboratory for further information if necessary.        CULTURE BLOOD AEROBIC AND ANAEROBIC [[id number]] Collected: 11/12/19 1749    Order Status: Completed Specimen: Blood, Venipuncture  Updated: 11/14/19 2121    Narrative:      The order will result in two separate 8-48ml bottles  Please do NOT order repeat blood cultures if one has been  drawn within the last 48 hours  UNLESS concerned for  endocarditis  AVOID BLOOD CULTURE DRAWS FROM CENTRAL LINE IF POSSIBLE  Indications:->Bacteremia  ORDER#: [id number]                                    ORDERED BY: Reesa Chew  SOURCE: Blood, Venipuncture                          COLLECTED:  11/12/19 17:49  ANTIBIOTICS AT COLL.:  RECEIVED :  11/12/19 20:46  Culture Blood Aerobic and Anaerobic        PRELIM      11/14/19 21:21  11/13/19   No Growth after 1 day/s of incubation.  11/14/19   No Growth after 2 day/s of incubation.      MRSA culture - Nares [[id number]] Collected: 11/12/19 1749    Order Status: Completed Specimen: Culturette from Nares Updated: 11/13/19 1544     Culture MRSA Surveillance Negative for Methicillin Resistant Staph aureus    MRSA culture - Throat [[id number]] Collected: 11/12/19 1749    Order Status: Completed Specimen: Culturette from Throat Updated: 11/13/19 1544     Culture MRSA Surveillance Negative for Methicillin Resistant Staph aureus    Blood Culture Rapid Identification: Gram-Positive Panel [[id number]]  (Abnormal) Collected: 11/12/19 1749    Order Status: Completed Updated: 11/14/19 0436     Interpretation - ID by NAA See Below     Comment: Probable Staphylococcus epidermidis, Methicillin resistant.        Bacillus cereus group ID by NAA Not Detected     Bacillus subtilis group ID by NAA Not Detected     Corynebacterium species ID by NAA Not Detected     Cutibacterium acnes (Propionibacterium acnes) ID by NAA Not Detected     Enterococcus species ID by NAA Not Detected     Enterococcus faecalis ID by NAA Not Detected     Enterococcus faecium ID by NAA Not Detected     vanA Vancomycin Resistance Gene (Enterococcus Only) ID by NAA N/A     vanB Vancomycin Resistance Gene (Enterococcus Only) ID by NAA  N/A     Lactobacillus species ID by NAA Not Detected     Listeria species ID by NAA Not Detected     Listeria monocytogenes ID by NAA Not Detected     Micrococcus species ID by NAA Not Detected     Staphylococcus species ID by NAA Detected     Staphylococcus aureus ID by NAA Not Detected     Staphylococcus epidermidis ID by NAA Detected     Staphylococcus lugdunensis ID by NAA Not Detected     mecA Methicillin Resistance Gene (Staphylococcus Only) ID by NAA Detected     mecC Methicillin Resistance Gene (Staphylococcous Only) ID by NAA Not Detected     Streptococcus species ID by NAA Not Detected     Streptococcus agalactiae (GBS) ID by NAA Not Detected     Streptococcus anginosus group ID by NAA Not Detected     Streptococcus pneumoniae ID by NAA Not Detected     Streptococcus pyogenes (GAS) ID by NAA Not Detected     Candida spp. (C.albicans, C.glabrata, C. krusei, C. parapsilosis) ID by NAA Not Detected     Comment: Species include C. albicans, C.glabrata, C. krusei,  or C. parapsilosis          Pan Gram-Negative ID by NAA Not Detected     Comment: Test performed using the [facility] BCID-GP panel, a qualitative  nucleic acid multiplex assay. Negative results may be due to  the presence of pathogens that are not detected by this test.  Positive results do not rule out co-infection with other  organisms. Nucleic acids may be present in blood culture,  independent of bacterial viability. Detection of an assay  target does not guarantee that the corresponding bacteria are  infectious or are the causative agents for clinical symptoms.  Antimicrobial resistance can occur via multiple mechanisms.  A  Not Detected result for antimicrobial resistance gene  targets does not necessarily indicate antimicrobial  susceptibility.         Narrative:      [id number]_ called Micro Results of_BldCx. Results read back by:[id number], by [id number]  on 11/14/2019 at 01:29  The order will result in two separate 8-69ml bottles  Please do NOT order repeat  blood cultures if one has been  drawn within the last 48 hours  UNLESS concerned for  endocarditis  AVOID BLOOD CULTURE DRAWS FROM CENTRAL LINE IF POSSIBLE  Indications:->Bacteremia          Imaging, reviewed and are significant for:  CT Chest Abdomen Pelvis WO Contrast    Result Date: 11/16/2019   CT of the chest shows minimal left basilar atelectasis. The lungs are otherwise clear. The examination shows an unremarkable appearance of the thoracic aorta. Note the limitations of this study which was performed without contrast. Cardiomegaly is noted. CT scan of the abdomen and pelvis shows minimal pelvic ascites of uncertain etiology. No acute appearing bowel abnormalities are seen. The examination shows mild body wall edema. A normal-appearing appendix is visualized. [name], MD  11/16/2019 4:33 AM    US Abdomen Complete    Result Date: 11/16/2019  1. Partially contracted gallbladder without cholelithiasis or cholecystitis. 2. Echogenic kidneys compatible with medical renal disease. Collene Schlichter, MD  11/16/2019 12:51 PM    US Carotid Duplex Dopp Comp Bilateral    Result Date: 11/16/2019   1. No signal plaque in the right carotid bulb and proximal internal carotid artery with no hemodynamically significant stenosis. 2. Minimal plaque in the left carotid bulb and proximal internal carotid artery with less than 50% and no hemodynamically significant stenosis. Adelina Mings, MD  11/16/2019 11:47 AM    US Venous Duplex Doppler Arm Right    Result Date: 11/15/2019  1. Superficial and deep venous thrombosis right upper extremity as above. 2. Nonocclusive thrombus left innominate vein. 3. Urgent findings discussed with Dr. Verdene Lennert at 4 PM on 11/15/2019. Clide Cliff, MD  11/15/2019 3:59 PM    [place] Noninvas Low Extrem Art Dopp/Press/Wavefrms (PVR) Comp 3-4 Levels    Result Date: 11/16/2019  No arterial insufficiency of the lower extremities at rest with unremarkable noninvasive studies. Adelina Mings, MD  11/16/2019 11:55  AM    Echo:  Summary    * The left ventricle is severely dilated.    * Left ventricular wall thickness is normal.    * Severe left ventricular global hypokinesis.    * Left ventricular systolic function is severely decreased with an ejection  fraction by Biplane Method of Discs of  27 %.    * The right ventricular cavity size is severely dilated.    * Decreased right ventricular systolic function.    * TAPSE = 1.8 cm, S' = 11 cm/s, fractional area change = 21 %.    * The left atrium is severely dilated.    * The right atrium is severely dilated.    * There is mild mitral regurgitation.    * There is mild tricuspid regurgitation.    * No valvular vegetations visualized.    * Severe pulmonary hypertension with estimated right ventricular systolic  pressure of  73 mmHg.    * The ascending aorta is mildly dilated.    * Trivial circumferential pericardial effusion visualized.    * No prior study is available for comparison.    Signed by: Verdene Lennert, MD

## 2019-11-16 NOTE — PT Progress Note (Signed)
Physical Therapy Cancellation Note    [facility]   Physical Therapy Cancellation Note      Patient:  [name] [name] MRN#:  [id number]  [facility]  [facility] Room/Bed:  FI315/FI315-01    11/16/2019  Time: 14:44      Pt not seen for physical therapy secondary to pt politely declining PT services at this time. Will continue to follow up as appropriate and as schedule permits. Thank you.    Terie Purser  DPT, PT  Pager# [phone]

## 2019-11-17 LAB — BASIC METABOLIC PANEL
Anion Gap: 8 (ref 5.0–15.0)
BUN: 46 mg/dL — ABNORMAL HIGH (ref 9.0–28.0)
CO2: 15 mEq/L — ABNORMAL LOW (ref 22–29)
Calcium: 7.8 mg/dL — ABNORMAL LOW (ref 8.5–10.5)
Chloride: 108 mEq/L (ref 100–111)
Creatinine: 2.3 mg/dL — ABNORMAL HIGH (ref 0.7–1.3)
Glucose: 95 mg/dL (ref 70–100)
Potassium: 5 mEq/L (ref 3.5–5.1)
Sodium: 131 mEq/L — ABNORMAL LOW (ref 136–145)

## 2019-11-17 LAB — GLUCOSE WHOLE BLOOD - POCT
Whole Blood Glucose POCT: 79 mg/dL (ref 70–100)
Whole Blood Glucose POCT: 91 mg/dL (ref 70–100)
Whole Blood Glucose POCT: 139 mg/dL — ABNORMAL HIGH (ref 70–100)
Whole Blood Glucose POCT: 123 mg/dL — ABNORMAL HIGH (ref 70–100)

## 2019-11-17 LAB — CBC
Absolute NRBC: 0 10*3/uL (ref 0.00–0.00)
Hematocrit: 23.8 % — ABNORMAL LOW (ref 37.6–49.6)
Hgb: 7.4 g/dL — ABNORMAL LOW (ref 12.5–17.1)
MCH: 26.3 pg (ref 25.1–33.5)
MCHC: 31.1 g/dL — ABNORMAL LOW (ref 31.5–35.8)
MCV: 84.7 fL (ref 78.0–96.0)
MPV: 10 fL (ref 8.9–12.5)
Nucleated RBC: 0 /100 WBC (ref 0.0–0.0)
Platelets: 295 10*3/uL (ref 142–346)
RBC: 2.81 10*6/uL — ABNORMAL LOW (ref 4.20–5.90)
RDW: 22 % — ABNORMAL HIGH (ref 11–15)
WBC: 8.36 10*3/uL (ref 3.10–9.50)

## 2019-11-17 LAB — ANTITHROMBIN III LEVEL: Antithrombin Activity: 53 % — ABNORMAL LOW (ref 81–133)

## 2019-11-17 LAB — ANTI-XA,UFH
Anti-Xa, UFH: 0.28
Anti-Xa, UFH: 0.57
Anti-Xa, UFH: 0.53

## 2019-11-17 LAB — MAGNESIUM: Magnesium: 1.6 mg/dL (ref 1.6–2.6)

## 2019-11-17 LAB — GFR: EGFR: 37.9

## 2019-11-17 LAB — CK: Creatine Kinase (CK): 14 U/L — ABNORMAL LOW (ref 47–267)

## 2019-11-17 LAB — LACTIC ACID, PLASMA: Lactic Acid: 0.8 mmol/L (ref 0.2–2.0)

## 2019-11-17 MED ORDER — METOPROLOL SUCCINATE ER 50 MG PO TB24
50.0000 mg | ORAL_TABLET | Freq: Every day | ORAL | Status: DC
Start: 2019-11-18 — End: 2019-11-19
  Administered 2019-11-18 – 2019-11-19 (×2): 50 mg via ORAL
  Filled 2019-11-17 (×2): qty 1

## 2019-11-17 MED ORDER — METOPROLOL SUCCINATE ER 25 MG PO TB24
25.0000 mg | ORAL_TABLET | Freq: Once | ORAL | Status: AC
Start: 2019-11-17 — End: 2019-11-17
  Administered 2019-11-17: 25 mg via ORAL
  Filled 2019-11-17: qty 1

## 2019-11-17 MED ORDER — MAGNESIUM SULFATE IN D5W 1-5 GM/100ML-% IV SOLN
1.0000 g | INTRAVENOUS | Status: AC
Start: 2019-11-17 — End: 2019-11-17
  Administered 2019-11-17 (×3): 1 g via INTRAVENOUS
  Filled 2019-11-17 (×3): qty 100

## 2019-11-17 MED ORDER — HYDRALAZINE HCL 50 MG PO TABS
75.0000 mg | ORAL_TABLET | Freq: Three times a day (TID) | ORAL | Status: DC
Start: 2019-11-17 — End: 2019-11-18
  Administered 2019-11-17 (×2): 75 mg via ORAL
  Filled 2019-11-17 (×3): qty 1

## 2019-11-17 NOTE — Plan of Care (Incomplete)
Pt name/Code: Mr.Witten Najarian (44 y.o. male);Full Code  Admit Date/Dx: 11/12/2019 Cardiomyopathy  Weights: Last 3 Weights for the past 72 hrs (Last 3 readings):   Weight   11/17/19 0523 74.4 kg (164 lb)   11/16/19 0424 74.5 kg (164 lb 3.2 oz)   11/15/19 0700 74.6 kg (164 lb 6.4 oz)       Shift comment: BP elevated, Metoprolol dose increase. Patient expressed desire to go home, but will need a new central line for Milrinone and IV antibiotics. Patient will need new CADD pump as his is back home in [place]. Patient also needs transportation set up back to [place]. Blood cultures from 9/12 NGTD.    Neuro: AOx4, irritable at times  Pain: headache, given oxycodone po x 1  Rhythm on Tele:  HR:    SBP: 150s  Oxygen/Airway: RA, clear/dim, Sp02 > 95%  GI:  Continent  Last BM Date: 11/17/19  GU: Continent, amber/clear  Diet: Diet heart healthy  IV Access: RUA Midline, draws and flushes  Falls Risk: Moderate  Ambulation: Stand-by assist   Plan:   Prednisone taper  NPO after MN for Hohn placement tomorrow  Midline in place for home IV Daptomycin  Milrinone-resumption of care  Continue transplant eval as outpt    Shift Skin Assessment:  Bony Areas Redness?  Y/N Blanchable? Y/N  If no- place WOCN consult Open?  Y/N Diameter  if applicable Comments   ? ? ? EARS  ? ? ?   Ear (R) ?n ? ? ? ?   Ear (L) ?n ? ? ? ?   ? ? ? ELBOWS  ? ? ?   Elbow (R) ?n ? ? ? ?   Elbow (L) ?n ? ? ? ?   ? ? ? HIPS  ? ? ?   Hip (R) ?n ? ? ? ?   Hip (L) ?n ? ? ? ?   Sacrum ?n ? ? ? ?   ? ? ? HEELS ? ?   Heel (R) ?n ? ? ? ?   Heel (L) ?n ? ? ? ?   ? ? ? DEVICES ? ?   Underneath ?n ? ? ? ?   Above ?n ? ? ? ?     -Scattered scars and tattoos    Recent Labs   Lab 11/17/19  0516   Creatinine 2.3*         Recent Labs   Lab 11/17/19  0516   Potassium 5.0        Recent Labs   Lab 11/17/19  0516   Magnesium 1.6      1 mg/bag of Mg x 3 given            Problem: Everyday - Heart Failure  Goal: Stable Vital Signs and Fluid Balance  Flowsheets (Taken 11/17/2019  1717)  Stable Vital Signs and Fluid Balance:   Daily Standing Weights in the morning using the same scale, after using the bathroom and before breadfast.  If unable to stand, zero the bed and use the bed scale   Monitor, assess vital signs and telemetry per policy   Monitor labs and report abnormalities to physician   Strict Intake/Output   Fluid Restriction   Assess for swelling/edema   Wean oxygen as needed if appropriate

## 2019-11-17 NOTE — Progress Notes (Signed)
Advanced Heart Failure and Transplant Consult Note    Heart Failure/Transplant Spectra Link [id number]    Listed for transplant:  Pending.    Assessment:  [name] [name] is a 44 y.o. male. He has a past medical history of NICM (EF 15-20%, JUL2021)on home milrinone, HS (on prednisone),and CKD3 with 5 days ofworsening myalgias, weakness,fever,and dyspnea transferred from [facility] with MRSA bacteremia because he is currently undergoing transplant eval with Dr. Kyung Rudd at [facility] (previously deemed not LVAD/transplant candidate by [facility]).     NICM, EF15-20%,on home milrinone   Acute on chronic systolic heart failure   Prior LV thrombus 2018; resolved   PE   CKD, baseline creatinine ~2.2   Hidradenitis suppurativa; on home prednisone   MRSA bacteremia, suspected from PICC line (removed 11/13/19); blood cultures and PICC line culture tip positive for Staph epidermidis    Leukocytosis   Anemia of chronic disease   LUE DVTs; ultrasound 11/14/19 revealed occlusive DVT of L subclavian vein, axillary vein, and brachial veins, occlusive superficial thrombus in L basilic vein   Hyponatremia   Acute pain to RUE; improving today    Recommendations:       Metoprolol XL 25 mg daily (increase to Toprol XL 50 mg PO daily), Hydralazine 50 mg PO Q8, Isosorbide dinitrate 30 mg TID; consider going up on hydral or ISDN for afterload reduction.   Milrinone infusion at 0.25 mcg/kg/min   Furosemide 40 mg PO daily   On Prednisone 30 mg daily for hidradenitis suppurativa   On heparin drip for bilateral UE DVT; transition to Eliquis at discharge   Follow up hypercoagulable work up.   Antibiotic management per primary medicine team   Follow up blood cultures, repeat sent 11/14/19   Pain management per primary medicine team.     Patient was recently admitted at [facility] and evaluated for advance therapies, but was declined due to recurrent infections secondary to hidradenitis suppurativa.  Here for second opinion.   Initiating LVAD  and transplant evaluation but can be continued as an outpatient.   Hepatitis B vaccine    Signed: Viann Shove, MD  -----------------------------------------------------------------------------------------------------------  I have personally interviewed and examined the patient.I have reviewed the provider's history, exam, assessment and management plans. I concur with or have edited all elements of the provider's note.    - Renal dysfunction limits how much we can optimize GDMT- he is on BB, hydral/isordil   - He is milrinone dependent at 0.25 mcg/kg/min  - Will initiate LVAD and transplant eval while he is here as was planned by outpatient cardiologist Dr. Alba Destine. This can continue as an outpatient as well and should not keep him here  - He will need new PICC line for milrinone     Argentina Donovan, MD  --------------------------------------------------------------------------------------------------------------    Synopsis: [name] [name] is a 44 y.o. male. He has a past medical history of NICM (EF 15-20%, JUL2021)on home milrinone, HS (on prednisone),and CKD3 with 5 days ofworsening myalgias, weakness,fever,and dyspnea transferred from [facility] with MRSA bacteremia because he is currently undergoing transplant eval with Dr. Kyung Rudd at [facility] (previously deemed not LVAD/transplant candidate by [facility]).    Interval Events:     - Hematology consulted for upper extremity DVT. Recommended transitioning to Eliquis at discharge and complete hypercoagulable evaluation.  - Pt had 5 beats of Vtach @ 0426, pt asymptomatic.    Subjective: Patient would like to go home.    Objective:     Vital signs in last 24 hours:  Temp:  [97.3 F (36.3 C)-98.2 F (36.8 C)] 97.3 F (36.3 C)  Heart Rate:  [92-97] 95  Resp Rate:  [18] 18  BP: (130-156)/(73-86) 156/85  SpO2: 99 %   O2 Device: None (Room air)  Height: 185.4 cm (6\' 1" )    Weight:     Wt Readings from Last 4 Encounters:   11/17/19 74.4 kg (164 lb)    10/20/19 70.1 kg (154 lb 8 oz)     Intake/Output:    Intake/Output Summary (Last 24 hours) at 11/17/2019 1124  Last data filed at 11/17/2019 0348  Gross per 24 hour   Intake 1200 ml   Output 1900 ml   Net -700 ml     Net IO Since Admission: -1,265.12 mL [11/17/19 1124]    Physical Exam:     General: well-developed, alert, NAD  Eyes: Anicteric sclera, no erythema  ENT:  JVP ~  cm H2O, no carotid bruits. Carotid pulses are 2+ bilaterally  Lungs: CTA B/L, no rhonchi, wheezes or crackles with normal rate and effort  Cardiovascular: s1s2, normal rate, regular rhythm, no m/r/g, PMI laterally displaced  Abdominal: soft, NT/ND, +B.S., no HSM  Extremities: warm to touch, no cyanosis,  edema, peripheral pulses 2+ bilaterally  Neuromuscular exam: grossly non-focal, though not formally tested    Labs:    Basic Metabolic Panel:         11/17/19  0516 11/16/19  1359 11/15/19  0426 11/14/19  1520 11/14/19  0538 11/13/19  0545 11/13/19  0545   Creatinine 2.3* 2.2* 2.2*   < > 2.4*   < > 2.3*   BUN 46.0* 48.0* 53.0*   < > 57.0*   < > 54.0*   CO2 15* 15* 14*   < > 13*   < > 14*   Sodium 131* 134* 132*   < > 129*   < > 130*   Potassium 5.0 4.5 5.8*   < > 5.6*   < > 5.0   Magnesium 1.6  --   --   --  2.0  --  2.0   Phosphorus  --   --  3.7  --  2.5  --  2.2*   Chloride 108 110 109   < > 107   < > 108   Calcium 7.8* 7.7* 7.7*   < > 8.1*   < > 8.0*    < > = values in this interval not displayed.     Liver Function Test:        11/16/19  1359 11/12/19  1749   AST (SGOT) 27 41*   ALT 36 29   Bilirubin, Total 0.9 2.3*     Complete Blood Count:        11/17/19  0516 11/16/19  0802 11/15/19  0426   WBC 8.36 9.80* 13.23*   Hgb 7.4* 8.0* 8.5*   Hematocrit 23.8* 25.2* 26.9*   Platelets 295 289 242     Coagulation Labs:        11/14/19  1917 11/12/19  1807   PT  --  16.4*   PT INR  --  1.4*   PTT 30 30     Cardiac Markers:        11/16/19  0802 11/13/19  0545 11/13/19  0008 11/12/19  1749   B-Natriuretic Peptide 1,056*  --   --   --    Troponin I   --  0.38* 0.43* 0.41*     Cardiac Studies:  Echocardiogram (11/12/2019):     * The left ventricle is severely dilated.    * Left ventricular wall thickness is normal.    * Severe left ventricular global hypokinesis.    * Left ventricular systolic function is severely decreased with an ejection  fraction by Biplane Method of Discs of  27 %.    * The right ventricular cavity size is severely dilated.    * Decreased right ventricular systolic function.    * TAPSE = 1.8 cm, S' = 11 cm/s, fractional area change = 21 %.    * The left atrium is severely dilated.    * The right atrium is severely dilated.    * There is mild mitral regurgitation.    * There is mild tricuspid regurgitation.    * No valvular vegetations visualized.    * Severe pulmonary hypertension with estimated right ventricular systolic  pressure of  73 mmHg.    * The ascending aorta is mildly dilated.    * Trivial circumferential pericardial effusion visualized.    * No prior study is available for comparison.    Imaging:     No results found.    Medications:    Inpatient Medications:    Scheduled:      DAPTOmycin (CUBICIN) IVPB, 6 mg/kg, Intravenous, Q24H    ferrous sulfate, 324 mg, Oral, Q48H    furosemide, 40 mg, Oral, Daily    hydrALAZINE, 75 mg, Oral, Q8H SCH    isosorbide dinitrate, 30 mg, Oral, TID - Nitrate Free Interval    magnesium sulfate, 1 g, Intravenous, Q1H    metoprolol succinate XL, 25 mg, Oral, Daily    pantoprazole, 40 mg, Oral, QAM AC    predniSONE, 30 mg, Oral, QAM W/BREAKFAST    senna-docusate, 1 tablet, Oral, QHS    Infusions:    heparin infusion 25,000 units/500 mL (VTE/Moderate Intensity), Last Rate: 28 Units/kg/hr (11/17/19 0728)  milrinone, Last Rate: 0.25 mcg/kg/min (11/16/19 1919)     Home Medications     Med List Status: Complete Set By: Carney Bern, RN at 11/13/2019  4:51 AM                busPIRone (BUSPAR) 5 MG tablet     Take 5 mg by mouth 2 (two) times daily     ferrous sulfate 325 (65 FE) MG EC  tablet     Take 325 mg by mouth every other day     furosemide (LASIX) 40 MG tablet     Take 40 mg by mouth daily     hydrALAZINE (APRESOLINE) 50 MG tablet     Take 50 mg by mouth 3 (three) times daily     hydrOXYzine (ATARAX) 25 MG tablet     Take 12.5 mg by mouth 3 (three) times daily as needed     isosorbide dinitrate (ISORDIL) 30 MG tablet     Take 30 mg by mouth 3 (three) times daily     magnesium oxide (MAG-OX) 400 MG tablet     Take 800 mg by mouth 2 (two) times daily     metoprolol succinate XL (TOPROL-XL) 25 MG 24 hr tablet     Take 25 mg by mouth daily     Milrinone Lactate in Dextrose 40-5 MG/200ML-% Solution     Infuse 0.25 mcg/kg/min into the vein continuous     mirtazapine (REMERON) 7.5 MG tablet     Take 7.5 mg by mouth nightly     Multiple  Vitamins-Minerals (multivitamin with minerals) tablet     Take 1 tablet by mouth daily     naloxone (Narcan) 4 MG/0.1ML nasal spray     1 spray by Nasal route as needed     nystatin (NYSTOP) powder     Apply 1 application topically as needed     polyethylene glycol (MIRALAX) 17 GM/SCOOP powder     Take 17 g by mouth as needed     Thiamine HCl (VITAMIN B-1 PO)     Take 100 mg by mouth daily     triamcinolone (KENALOG) 0.1 % ointment     Apply 1 application topically as needed     Veltassa 8.4 g Pack     Take 1 packet by mouth daily     Wound Cleansers (Vashe Cleansing) Solution     Apply 1 applicator topically as needed

## 2019-11-17 NOTE — Progress Notes (Signed)
Schaff,Marcellis    This Clinical research associate introduced herself to Mr Bauernfeind, explained the purpose and length of the nutrition evaluation. Provided Mr Doenges with the option to continue with VAD/Heart Transplant nutrition evaluation or to try again tomorrow 11/18/19. Mr Uselman asked to do it tomorrow.  Will attempt to see Mr Mehring tomorrow 11/18/19.    Hamilton Capri, MS, RD, CCTD  Spectra/Office: [phone]  Via Secure or My Chart Messaging

## 2019-11-17 NOTE — PT Progress Note (Signed)
Physical Therapy Cancellation Note    [facility]   Physical Therapy Cancellation Note      Patient:  [name] [name] MRN#:  [id number]  [facility] Room/Bed:  FI315/FI315-01    11/17/2019  Time: 12:28 PM      Pt not seen for physical therapy secondary to pt politely refusing PT services, stating he has no needs for PT. Per pt he is independent and moving around with no issues. Confirmed by RN. Will d/c current PT orders at this time. Please reconsult if pt status changes. Thank you.    Terie Purser  DPT, PT  Pager# [phone]

## 2019-11-17 NOTE — Progress Notes (Signed)
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 Kyung Rudd at [facility] (previously deemed not LVAD/transplant candidate by [facility]). Admitted to CICU given persistent tachycardiaand c/f cardiogenic component, but CICU course suspects symptoms likely 2/2 sepsis alone. PICC removed and cultures sent, midline placed.     Plan:     # Sepsis 2/2 MRSA bacteremia, POA  Never hypotensive. PICC removed, suspected source as painful swelling at insertion site. BCx at [facility] growing MRSA. Covid neg. No other painful swelling of HA per ICU.  - repeat blood cx 9/10 with 1/2 bottles GPC in clusters - staph epi  - f/u cath tip cx 9/11 staph epi  - repeat blood cx 9/12-negative to date  - seen by ID and changed to daptomycin  -Check CK level  -We will need to arrange for hohn vs picc line for milrinone and IV daptomycin    #Left upper extremity DVT-Doppler revealed occlusive DVT of the left subclavian vein, axillary vein and brachial veins, occlusive superficial thrombus in the left basilic vein.  +RUE DVT  -Continue heparin drip  -heme eval-will most likely need hypercoag workup  -pt denies dvt but hx of PE as pr records   -Pain management with oxycodone as needed    # Hyperkalemia  Of note on patiromer Ernesto Rutherford) at home but not on formulary here    # NICM (EF 15-20% [id number]) on home milrinone  Undergoing transplant eval with Dr. Kyung Rudd (previously deemed not LVAD/transplant candidate by [facility]).   - f/u cards recs  - continue home Toprol, increase hydralazine to 75 mg every 8 hours and continue isordil  - d/w home mildrinone 0.25 mcg/kg/min  -LVAD and transplant workup as inpt  - c/w home  lasix  -Echo reviewed     # CKD stage 3  # NAGMA - possibly 2/2 diarrhea at home?  - home patiromer not on formulary - kayexlate as above  - tolerating home Lasix  - called outpt nephrologist Dr. Burna Mortimer [phone]    # HA on home prednisone   Currently doing well on prednisone. Pt started 40mg  on 31AUG x10 days with plan to go to 30mg  09SEP x10 days and then 20mg  daily for 10 days.   - prednisone 30mg  daily x10 days (END: 18SEP)   - will taper to 20mg  daily x10 days starting 19SEP    # Chronic anemia  - continue iron repletion    DVT prophylaxis: Heparin drip  Case discussed with: pt, RN, Pt's mom        Disposition: (Please see PAF column for Expected D/C Date)   Today's date: 11/17/2019   Admit Date: 11/12/2019  4:44 PM   LOS: 5  Clinical Milestones: Bacteremia  Anticipated discharge needs: TBD      Subjective     CC: Cardiomyopathy    Interval History/24 hour events: No events    HPI/Subjective: Denies upper extremity pain.  Wanting to go home.      Review of Systems:     As per HPI    Physical Exam:     VITAL SIGNS PHYSICAL EXAM   Temp:  [97.3 F (36.3 C)-98.2 F (36.8 C)] 97.3 F (  36.3 C)  Heart Rate:  [92-101] 95  Resp Rate:  [18] 18  BP: (132-156)/(76-85) 132/76  Blood Glucose:    NSR      Intake/Output Summary (Last 24 hours) at 11/17/2019 1419  Last data filed at 11/17/2019 1329  Gross per 24 hour   Intake 800 ml   Output 2600 ml   Net -1800 ml    Physical Exam  General: AO*3   Cardiovascular: regular rate and rhythm, no murmurs, rubs or gallops  Lungs: clear to auscultation bilaterally, without wheezing, rhonchi, or rales  Abdomen: soft, non-tender, non-distended; no palpable masses,  normoactive bowel sounds  Extremities: no edema  Other:   Right upper extremity midline       Meds:     Medications were reviewed:  Current Facility-Administered Medications   Medication Dose Route Frequency    DAPTOmycin (CUBICIN) IVPB  6 mg/kg Intravenous Q24H    ferrous sulfate  324 mg Oral Q48H    furosemide   40 mg Oral Daily    hydrALAZINE  75 mg Oral Q8H SCH    isosorbide dinitrate  30 mg Oral TID - Nitrate Free Interval    magnesium sulfate  1 g Intravenous Q1H    [START ON 11/18/2019] metoprolol succinate XL  50 mg Oral Daily    pantoprazole  40 mg Oral QAM AC    predniSONE  30 mg Oral QAM W/BREAKFAST    senna-docusate  1 tablet Oral QHS     Current Facility-Administered Medications   Medication Dose Route Frequency Last Rate    heparin infusion 25,000 units/500 mL (VTE/Moderate Intensity)  18 Units/kg/hr Intravenous Continuous 28 Units/kg/hr (11/17/19 0728)    milrinone  0.25 mcg/kg/min Intravenous Continuous 0.25 mcg/kg/min (11/17/19 1151)     Current Facility-Administered Medications   Medication Dose Route    acetaminophen  650 mg Oral    heparin (porcine)  40 Units/kg Intravenous    heparin (porcine)  80 Units/kg Intravenous    oxyCODONE  5 mg Oral         Labs:     Labs (last 72 hours):    Recent Labs   Lab 11/17/19  0516 11/16/19  0802   WBC 8.36 9.80*   Hgb 7.4* 8.0*   Hematocrit 23.8* 25.2*   Platelets 295 289       Recent Labs   Lab 11/14/19  1917 11/12/19  1807   PT  --  16.4*   PT INR  --  1.4*   PTT 30 30    Recent Labs   Lab 11/17/19  0516 11/16/19  1359 11/13/19  0008 11/12/19  1749   Sodium 131* 134*  More results in Results Review 133*   Potassium 5.0 4.5  More results in Results Review 3.9   Chloride 108 110  More results in Results Review 109   CO2 15* 15*  More results in Results Review 15*   BUN 46.0* 48.0*  More results in Results Review 54.0*   Creatinine 2.3* 2.2*  More results in Results Review 2.4*   Calcium 7.8* 7.7*  More results in Results Review 7.8*   Albumin  --  2.1*  --  2.0*   Protein, Total  --  7.7  --  7.8   Bilirubin, Total  --  0.9  --  2.3*   Alkaline Phosphatase  --  154*  --  182*   ALT  --  36  --  29   AST (SGOT)  --  27  --  41*   Glucose 95 82  More results in Results Review 96   More results in Results Review = values in this interval not displayed.                    Microbiology, reviewed and are significant for:  Microbiology Results (last 15 days)     Procedure Component Value Units Date/Time    CULTURE BLOOD AEROBIC AND ANAEROBIC [[id number]] Collected: 11/14/19 1520    Order Status: Sent Specimen: Blood, Venipuncture Updated: 11/14/19 1741    Narrative:      The order will result in two separate 8-23ml bottles  Please do NOT order repeat blood cultures if one has been  drawn within the last 48 hours  UNLESS concerned for  endocarditis  AVOID BLOOD CULTURE DRAWS FROM CENTRAL LINE IF POSSIBLE  Indications:->Sepsis  Indications:->Bacteremia  Rescheduled by [id number] at 11/14/2019 15:19 Reason: Printed by   mistake/Printing Issues.  1 BLUE+1 PURPLE    CULTURE BLOOD AEROBIC AND ANAEROBIC [[id number]] Collected: 11/14/19 1520    Order Status: Sent Specimen: Blood, Venipuncture Updated: 11/14/19 1741    Narrative:      The order will result in two separate 8-50ml bottles  Please do NOT order repeat blood cultures if one has been  drawn within the last 48 hours  UNLESS concerned for  endocarditis  AVOID BLOOD CULTURE DRAWS FROM CENTRAL LINE IF POSSIBLE  Indications:->Sepsis  Indications:->Bacteremia  Rescheduled by [id number] at 11/14/2019 15:19 Reason: Printed by   mistake/Printing Issues.  1 BLUE+1 PURPLE    CULTURE CATHETER TIP [[id number]] Collected: 11/13/19 2216    Order Status: Completed Specimen: Catheter Tip, PICC Updated: 11/15/19 1202    Narrative:      ORDER#: [id number]                                    ORDERED BY: SHI, PETER  SOURCE: Catheter Tip, PICC picc                      COLLECTED:  11/13/19 22:16  ANTIBIOTICS AT COLL.:                                RECEIVED :  11/13/19 23:38  Culture Catheter Tip                       PRELIM      11/15/19 12:01   +  11/15/19   <15 CFU Staphylococcus aureus               No further work,             Questionable significance due to low quantity    11/15/19   <15 CFU Staphylococcus epidermidis               No further work,              Questionable significance due to low quantity        CULTURE BLOOD AEROBIC AND ANAEROBIC [[id number]] Collected: 11/12/19 1749    Order Status: Completed Specimen: Blood, Venipuncture Updated: 11/15/19 1230    Narrative:      [id number] called Micro Results of_BldCx. Results read back by:[id number], by [id number]  on 11/14/2019 at 01:29  The order will result in two separate  8-80ml bottles  Please do NOT order repeat blood cultures if one has been  drawn within the last 48 hours  UNLESS concerned for  endocarditis  AVOID BLOOD CULTURE DRAWS FROM CENTRAL LINE IF POSSIBLE  Indications:->Bacteremia  ORDER#: [id number]                                    ORDERED BY: Reesa Chew  SOURCE: Blood, Venipuncture                          COLLECTED:  11/12/19 17:49  ANTIBIOTICS AT COLL.:                                RECEIVED :  11/12/19 20:46  [id number]_ called Micro Results of_BldCx. Results read back by:[id number], by [id number] on 11/14/2019 at 01:29  Culture Blood Aerobic and Anaerobic        PRELIM      11/15/19 12:30   +  11/13/19   No Growth after 1 day/s of incubation.  11/14/19   Aerobic Blood Culture Positive in less than 24 hrs             Gram Stain Shows: Gram positive cocci in clusters               Rapid identification will be performed using the [facility] BCID-GP             Panel. This testing is only performed on the first positive blood             culture per patient with Gram positive bacteria. Please see BCID-GP             Panel(Blood Culture Rapid Identification: Gram Positive Panel)             results located under Blood Cultures on the Result Review tree.  11/15/19   Anaerobic culture no growth to date, final report to follow  11/15/19   Growth of Staphylococcus epidermidis               Possible skin contaminant, susceptibility testing not             performed without request unless additional blood cultures,             collected within 48 hours of this culture, become positive.             Contact the laboratory for  further information if necessary.        CULTURE BLOOD AEROBIC AND ANAEROBIC [[id number]] Collected: 11/12/19 1749    Order Status: Completed Specimen: Blood, Venipuncture Updated: 11/14/19 2121    Narrative:      The order will result in two separate 8-69ml bottles  Please do NOT order repeat blood cultures if one has been  drawn within the last 48 hours  UNLESS concerned for  endocarditis  AVOID BLOOD CULTURE DRAWS FROM CENTRAL LINE IF POSSIBLE  Indications:->Bacteremia  ORDER#: [id number]                                    ORDERED BY: Reesa Chew  SOURCE: Blood, Venipuncture  COLLECTED:  11/12/19 17:49  ANTIBIOTICS AT COLL.:                                RECEIVED :  11/12/19 20:46  Culture Blood Aerobic and Anaerobic        PRELIM      11/14/19 21:21  11/13/19   No Growth after 1 day/s of incubation.  11/14/19   No Growth after 2 day/s of incubation.      MRSA culture - Nares [[id number]] Collected: 11/12/19 1749    Order Status: Completed Specimen: Culturette from Nares Updated: 11/13/19 1544     Culture MRSA Surveillance Negative for Methicillin Resistant Staph aureus    MRSA culture - Throat [[id number]] Collected: 11/12/19 1749    Order Status: Completed Specimen: Culturette from Throat Updated: 11/13/19 1544     Culture MRSA Surveillance Negative for Methicillin Resistant Staph aureus    Blood Culture Rapid Identification: Gram-Positive Panel [[id number]]  (Abnormal) Collected: 11/12/19 1749    Order Status: Completed Updated: 11/14/19 0436     Interpretation - ID by NAA See Below     Comment: Probable Staphylococcus epidermidis, Methicillin resistant.        Bacillus cereus group ID by NAA Not Detected     Bacillus subtilis group ID by NAA Not Detected     Corynebacterium species ID by NAA Not Detected     Cutibacterium acnes (Propionibacterium acnes) ID by NAA Not Detected     Enterococcus species ID by NAA Not Detected     Enterococcus faecalis ID by NAA Not Detected      Enterococcus faecium ID by NAA Not Detected     vanA Vancomycin Resistance Gene (Enterococcus Only) ID by NAA N/A     vanB Vancomycin Resistance Gene (Enterococcus Only) ID by NAA N/A     Lactobacillus species ID by NAA Not Detected     Listeria species ID by NAA Not Detected     Listeria monocytogenes ID by NAA Not Detected     Micrococcus species ID by NAA Not Detected     Staphylococcus species ID by NAA Detected     Staphylococcus aureus ID by NAA Not Detected     Staphylococcus epidermidis ID by NAA Detected     Staphylococcus lugdunensis ID by NAA Not Detected     mecA Methicillin Resistance Gene (Staphylococcus Only) ID by NAA Detected     mecC Methicillin Resistance Gene (Staphylococcous Only) ID by NAA Not Detected     Streptococcus species ID by NAA Not Detected     Streptococcus agalactiae (GBS) ID by NAA Not Detected     Streptococcus anginosus group ID by NAA Not Detected     Streptococcus pneumoniae ID by NAA Not Detected     Streptococcus pyogenes (GAS) ID by NAA Not Detected     Candida spp. (C.albicans, C.glabrata, C. krusei, C. parapsilosis) ID by NAA Not Detected     Comment: Species include C. albicans, C.glabrata, C. krusei,  or C. parapsilosis          Pan Gram-Negative ID by NAA Not Detected     Comment: Test performed using the [facility] BCID-GP panel, a qualitative  nucleic acid multiplex assay. Negative results may be due to  the presence of pathogens that are not detected by this test.  Positive results do not rule out co-infection with other  organisms. Nucleic acids may be present in blood culture,  independent of bacterial viability. Detection of an assay  target does not guarantee that the corresponding bacteria are  infectious or are the causative agents for clinical symptoms.  Antimicrobial resistance can occur via multiple mechanisms.  A Not Detected result for antimicrobial resistance gene  targets does not necessarily indicate antimicrobial  susceptibility.         Narrative:       [id number] called Micro Results of_BldCx. Results read back by:[id number], by [id number]  on 11/14/2019 at 01:29  The order will result in two separate 8-55ml bottles  Please do NOT order repeat blood cultures if one has been  drawn within the last 48 hours  UNLESS concerned for  endocarditis  AVOID BLOOD CULTURE DRAWS FROM CENTRAL LINE IF POSSIBLE  Indications:->Bacteremia          Imaging, reviewed and are significant for:  No results found.  Echo:  Summary    * The left ventricle is severely dilated.    * Left ventricular wall thickness is normal.    * Severe left ventricular global hypokinesis.    * Left ventricular systolic function is severely decreased with an ejection  fraction by Biplane Method of Discs of  27 %.    * The right ventricular cavity size is severely dilated.    * Decreased right ventricular systolic function.    * TAPSE = 1.8 cm, S' = 11 cm/s, fractional area change = 21 %.    * The left atrium is severely dilated.    * The right atrium is severely dilated.    * There is mild mitral regurgitation.    * There is mild tricuspid regurgitation.    * No valvular vegetations visualized.    * Severe pulmonary hypertension with estimated right ventricular systolic  pressure of  73 mmHg.    * The ascending aorta is mildly dilated.    * Trivial circumferential pericardial effusion visualized.    * No prior study is available for comparison.    Signed by: Verdene Lennert, MD

## 2019-11-17 NOTE — Plan of Care (Signed)
A&O x4. VSS. Pt rates pain between 3-8/10 in BUE, managing with PRN oxycodone. Labs drawn from  R midline.  Pt had 5 beats of Vtach @ 0426, pt asymptomatic, NP Sol Blazing notified.     Safety precautions maintained throughout shift. Bed in low position, call light within reach. Floor mat present. Pt refusing bed alarm, calls appropriately. Hourly rounding done. Needs met at this time.     --milrinone gtt @ 0.24mcg/kg/hr   --heparin gtt @ 28u/kg/hr, next anti-xa @ 12pm   --IV abx: daptomycin Q24hr   --diuresis with PO lasix 40 mg daily   --prednisone taper   --repeat blood cxs sent 9/12, pending results   --LVAD/heart transplant w/u   --poss D/C 9/15 on eliquis         Problem: Compromised Tissue integrity  Goal: Damaged tissue is healing and protected  Outcome: Progressing  Goal: Nutritional status is improving  Outcome: Progressing     Problem: Everyday - Heart Failure  Goal: Stable Vital Signs and Fluid Balance  Outcome: Progressing  Goal: Mobility/Activity is Maintained at Optimal Level for Patient  Outcome: Progressing  Goal: Nutritional Intake is Adequate  Outcome: Progressing  Goal: Teaching-Using CHF Warning Zones and Educational Videos  Outcome: Progressing     Problem: Inadequate Cardiac Output  Goal: Adequate tissue perfusion will be maintained  Outcome: Progressing     Problem: Safety  Goal: Patient will be free from injury during hospitalization  Outcome: Progressing  Goal: Patient will be free from infection during hospitalization  Outcome: Progressing     Problem: Pain  Goal: Pain at adequate level as identified by patient  Outcome: Progressing     Problem: Side Effects from Pain Analgesia  Goal: Patient will experience minimal side effects of analgesic therapy  Outcome: Progressing     Problem: Psychosocial and Spiritual Needs  Goal: Demonstrates ability to cope with hospitalization/illness  Outcome: Progressing     Problem: Moderate/High Fall Risk Score >5  Goal: Patient will remain free of  falls  Outcome: Progressing

## 2019-11-17 NOTE — Progress Notes (Signed)
ID PROGRESS NOTE    Date Time: 11/17/19 12:11 PM  Patient Name: [name] [name],[name] [name]        Subjective:     No fevers, BP slightly elevated at times, HR stable.  Denies any chest pain, abdominal pain, diarrhea, rashes, N/V  No cough, dyspnea arthralgias, urinary symptoms  No myalgias on dapto    Medications:     prednisone  daptomycin #3, total ABX #5    S/p ceftaroline     Lines:     CENTRAL LINES: none  Right arm midline placed on 9/11 no erythema nontender    Review of Systems:       As above    Physical Exam:     Vitals:    11/17/19 1134   BP: 148/84   Pulse: (!) 101   Resp: 18   Temp: 97.3 F (36.3 C)   SpO2: 99% ra     TMAX: 98.2    GEN: NAD, nontoxic, s in bed, comfortable  HEENT: AT/NC  CV: Borderline tachycardic upper 90s low 100s  PULM: CTA bilaterally  ABD: Soft, NT, NABS, no G/R  EXT: No BLE edema  DERM: No rashes, old healed scars from prior hidradenitis throughout body  NEURO: No facial droop    Labs:       Recent Labs   Lab 11/17/19  0516 11/16/19  0802 11/15/19  0426   WBC 8.36 9.80* 13.23*   RBC 2.81* 3.00* 3.17*   Hgb 7.4* 8.0* 8.5*   Hematocrit 23.8* 25.2* 26.9*   MCV 84.7 84.0 84.9   MCHC 31.1* 31.7 31.6   RDW 22* 22* 22*   MPV 10.0 10.1 10.7   Platelets 295 289 242       Recent Labs   Lab 11/17/19  0516 11/16/19  1359 11/15/19  0426 11/14/19  1520 11/14/19  0538 11/13/19  0545 11/13/19  0545   Sodium 131* 134* 132* 133* 129*  More results in Results Review 130*   Potassium 5.0 4.5 5.8* 4.9 5.6*  More results in Results Review 5.0   Chloride 108 110 109 109 107  More results in Results Review 108   CO2 15* 15* 14* 14* 13*  More results in Results Review 14*   BUN 46.0* 48.0* 53.0* 55.0* 57.0*  More results in Results Review 54.0*   Creatinine 2.3* 2.2* 2.2* 2.3* 2.4*  More results in Results Review 2.3*   Glucose 95 82 118* 135* 128*  More results in Results Review 93   Calcium 7.8* 7.7* 7.7* 7.9* 8.1*  More results in Results Review 8.0*   Magnesium 1.6  --   --   --  2.0  --  2.0   More results  in Results Review = values in this interval not displayed.       Recent Labs   Lab 11/16/19  1359 11/12/19  1749   ALT 36 29   AST (SGOT) 27 41*   Bilirubin, Total 0.9 2.3*   Albumin 2.1* 2.0*   Alkaline Phosphatase 154* 182*       MICRO  9/12 blood cx ngtd  9/11 cath tip CX <15 CFU S. aureus & <15 CFU Staph epidermidis  9/10 MRSA nares, throat negative  9/10 blood CX Staph epidermidis    Rads:     9/13 ultrasound abdomen  1. Partially contracted gallbladder without cholelithiasis orcholecystitis.  2. Echogenic kidneys compatible with medical renal disease.    9/13 ultrasound carotid Dopplers  1. No signal plaque  in the right carotid bulb and proximal internal  carotid artery with no hemodynamically significant stenosis.  2. Minimal plaque in the left carotid bulb and proximal internal carotid  artery with less than 50% and no hemodynamically significant stenosis.    9/13 arterial Dopplers  No arterial insufficiency of the lower extremities at rest with  unremarkable noninvasive studies.    9/13 CT chest abdomen pelvis  CT of the chest shows minimal left basilar atelectasis. The  lungs are otherwise clear. The examination shows an unremarkable  appearance of the thoracic aorta. Note the limitations of this study  which was performed without contrast. Cardiomegaly is noted.    CT scan of the abdomen and pelvis shows minimal pelvic ascites of  uncertain etiology. No acute appearing bowel abnormalities are seen. The  examination shows mild body wall edema. A normal-appearing appendix is  Visualized.    9/13 ultrasound RUE  1. Superficial and deep venous thrombosis right upper extremity as above.  2. Nonocclusive thrombus left innominate vein.    9/12 US doppler LUE  1. Occlusive deep venous thrombosis of the left subclavian vein,  axillary vein, and brachial veins.  2. Occlusive superficial thrombus in the left basilic vein.    9/10 TTE    * The left ventricle is severely dilated.    * Left ventricular wall thickness  is normal.    * Severe left ventricular global hypokinesis.    * Left ventricular systolic function is severely decreased with an ejection  fraction by Biplane Method of Discs of  27 %.    * The right ventricular cavity size is severely dilated.    * Decreased right ventricular systolic function.    * TAPSE = 1.8 cm, S' = 11 cm/s, fractional area change = 21 %.    * The left atrium is severely dilated.    * The right atrium is severely dilated.    * There is mild mitral regurgitation.    * There is mild tricuspid regurgitation.    * No valvular vegetations visualized.    * Severe pulmonary hypertension with estimated right ventricular systolic  pressure of  73 mmHg.    * The ascending aorta is mildly dilated.    * Trivial circumferential pericardial effusion visualized.    * No prior study is available for comparison.      Assessment:   44 year old male, Hx of nonischemic cardiomyopathy on home milrinone via PICC line, LV thrombus, PE, CKD, severe hidradenitis suppurativa s/p multiple surgeries, multiple skin infections, treated with Stelara and now on prednisone    9/7: Admission to [facility]  -Fevers, tachycardia, myalgias, leukocytosis  -MRSA bacteremia (9/7)    9/10:Tx'd to [facility]  -9/10: Bacteremia with staph epidermidis  -9/11 PICC removed, midline placed  -Cath tip culture S. aureus (not speciated), staph epidermidis  -Extensive thrombus: Left subclavian, axillary and brachial veins  -Superficial thrombus & DVT right cephalic right  basilic vein    Covid NONIMMUNE, declines vaccine    Plan:   1.  Bacteremia: MRSA, Staph epidermidis  -Source: PICC line (now removed)  -Currently on daptomycin, will continue.  - baseline CK added on  -2D echo 9/10 no vegetations  -Repeat blood CX from 9/12 remain NGTD  -Would need a 2-week course last negative ( blood cultures-through 9/26)  -Needs a line on discharge for milrinone and IV ABX (?  PICC VS [facility])- D/w Dr Clydene Fake    2. FTF completed/ in chart  [place] [id number]

## 2019-11-18 ENCOUNTER — Encounter
Admission: AD | Disposition: A | Discharge status: Home / Self Care | Payer: Self-pay | Source: Other Acute Inpatient Hospital | Attending: Internal Medicine

## 2019-11-18 DIAGNOSIS — I82603 Acute embolism and thrombosis of unspecified veins of upper extremity, bilateral: Secondary | ICD-10-CM

## 2019-11-18 DIAGNOSIS — A4102 Sepsis due to Methicillin resistant Staphylococcus aureus: Secondary | ICD-10-CM

## 2019-11-18 LAB — ANTI-CARDIOLIPIN ANTIBODY PANEL 1 (SOFT)
Cardiolipin IgG Antibody: <2 (ref <20.0)
Cardiolipin IgM Antibody: <2 (ref <20.0)

## 2019-11-18 LAB — GLUCOSE WHOLE BLOOD - POCT
Whole Blood Glucose POCT: 106 mg/dL — ABNORMAL HIGH (ref 70–100)
Whole Blood Glucose POCT: 70 mg/dL (ref 70–100)
Whole Blood Glucose POCT: 113 mg/dL — ABNORMAL HIGH (ref 70–100)
Whole Blood Glucose POCT: 123 mg/dL — ABNORMAL HIGH (ref 70–100)

## 2019-11-18 LAB — ANTI-XA,UFH: Anti-Xa, UFH: 0.59

## 2019-11-18 LAB — HOMOCYSTEINE, SERUM: HOMOCYSTEINE: 13.86 umol/L (ref 5.08–15.39)

## 2019-11-18 LAB — PROTEIN C ACTIVITY: Protein C Activity: 75 % (ref 70–150)

## 2019-11-18 LAB — CBC
Absolute NRBC: 0 10*3/uL (ref 0.00–0.00)
Hematocrit: 24.2 % — ABNORMAL LOW (ref 37.6–49.6)
Hgb: 7.7 g/dL — ABNORMAL LOW (ref 12.5–17.1)
MCH: 26.8 pg (ref 25.1–33.5)
MCHC: 31.8 g/dL (ref 31.5–35.8)
MCV: 84.3 fL (ref 78.0–96.0)
MPV: 9.3 fL (ref 8.9–12.5)
Nucleated RBC: 0 /100 WBC (ref 0.0–0.0)
Platelets: 310 10*3/uL (ref 142–346)
RBC: 2.87 10*6/uL — ABNORMAL LOW (ref 4.20–5.90)
RDW: 22 % — ABNORMAL HIGH (ref 11–15)
WBC: 9.19 10*3/uL (ref 3.10–9.50)

## 2019-11-18 LAB — BASIC METABOLIC PANEL
Anion Gap: 8 (ref 5.0–15.0)
BUN: 43 mg/dL — ABNORMAL HIGH (ref 9.0–28.0)
CO2: 15 mEq/L — ABNORMAL LOW (ref 22–29)
Calcium: 7.9 mg/dL — ABNORMAL LOW (ref 8.5–10.5)
Chloride: 110 mEq/L (ref 100–111)
Creatinine: 2.3 mg/dL — ABNORMAL HIGH (ref 0.7–1.3)
Glucose: 119 mg/dL — ABNORMAL HIGH (ref 70–100)
Potassium: 4.7 mEq/L (ref 3.5–5.1)
Sodium: 133 mEq/L — ABNORMAL LOW (ref 136–145)

## 2019-11-18 LAB — URINE NICOTINE AND COTININE
URINE NICOTINE: <5 ng/mL (ref <5.0)
Urine Anabasine: <2 ng/mL (ref <2.0)
Urine Cotinine: <5 ng/mL (ref <5.0)
Urine Nornicotine: <2 ng/mL (ref <2.0)

## 2019-11-18 LAB — T-SPOT, TB TEST RESULTS
Panel A Spot Cnt Minus Neg Control: 0
Panel B Spot Cnt Minus Neg Control: 0
T-SPOT, TB Test Results: NEGATIVE

## 2019-11-18 LAB — GFR: EGFR: 37.9

## 2019-11-18 SURGERY — TUNNELED CATH PLACEMENT
Anesthesia: Conscious Sedation

## 2019-11-18 MED ORDER — DIPHENHYDRAMINE HCL 50 MG/ML IJ SOLN
INTRAMUSCULAR | Status: AC
Start: 2019-11-18 — End: ?
  Filled 2019-11-18: qty 1

## 2019-11-18 MED ORDER — METOPROLOL SUCCINATE ER 50 MG PO TB24
50.0000 mg | ORAL_TABLET | Freq: Every day | ORAL | 0 refills | Status: AC
Start: 2019-11-19 — End: 2019-12-20

## 2019-11-18 MED ORDER — FENTANYL CITRATE (PF) 50 MCG/ML IJ SOLN (WRAP)
INTRAMUSCULAR | Status: AC | PRN
Start: 2019-11-18 — End: 2019-11-18
  Administered 2019-11-18: 25 ug via INTRAVENOUS

## 2019-11-18 MED ORDER — APIXABAN 5 MG PO TABS
5.0000 mg | ORAL_TABLET | Freq: Two times a day (BID) | ORAL | Status: DC
Start: 2019-11-25 — End: 2019-11-19

## 2019-11-18 MED ORDER — HEPARIN (PORCINE) IN NACL 2-0.9 UNIT/ML-% IJ SOLN (WRAP)
INTRAVENOUS | Status: AC
Start: 2019-11-18 — End: ?
  Filled 2019-11-18: qty 500

## 2019-11-18 MED ORDER — FAMOTIDINE 20 MG PO TABS
20.0000 mg | ORAL_TABLET | Freq: Two times a day (BID) | ORAL | Status: DC
Start: 2019-11-18 — End: 2019-11-19
  Administered 2019-11-18 – 2019-11-19 (×3): 20 mg via ORAL
  Filled 2019-11-18 (×3): qty 1

## 2019-11-18 MED ORDER — APIXABAN 5 MG PO TABS
10.0000 mg | ORAL_TABLET | Freq: Two times a day (BID) | ORAL | Status: DC
Start: 2019-11-18 — End: 2019-11-19
  Administered 2019-11-18 – 2019-11-19 (×2): 10 mg via ORAL
  Filled 2019-11-18 (×2): qty 2

## 2019-11-18 MED ORDER — SODIUM CHLORIDE 0.9 % IV SOLN
6.0000 mg/kg | INTRAVENOUS | Status: AC
Start: 2019-11-19 — End: 2019-11-28

## 2019-11-18 MED ORDER — MIDAZOLAM HCL 1 MG/ML IJ SOLN (WRAP)
INTRAMUSCULAR | Status: AC
Start: 2019-11-18 — End: ?
  Filled 2019-11-18: qty 2

## 2019-11-18 MED ORDER — MIDAZOLAM HCL 1 MG/ML IJ SOLN (WRAP)
INTRAMUSCULAR | Status: AC | PRN
Start: 2019-11-18 — End: 2019-11-18
  Administered 2019-11-18: .5 mg via INTRAVENOUS

## 2019-11-18 MED ORDER — HYDRALAZINE HCL 100 MG PO TABS
100.0000 mg | ORAL_TABLET | Freq: Three times a day (TID) | ORAL | 0 refills | Status: AC
Start: 2019-11-18 — End: 2019-12-18

## 2019-11-18 MED ORDER — FENTANYL CITRATE (PF) 50 MCG/ML IJ SOLN (WRAP)
INTRAMUSCULAR | Status: AC
Start: 2019-11-18 — End: ?
  Filled 2019-11-18: qty 2

## 2019-11-18 MED ORDER — APIXABAN 5 MG PO TABS
ORAL_TABLET | ORAL | 0 refills | Status: DC
Start: 2019-11-18 — End: 2019-11-18

## 2019-11-18 MED ORDER — APIXABAN 5 MG PO TABS
5.0000 mg | ORAL_TABLET | Freq: Two times a day (BID) | ORAL | Status: DC
Start: 2019-11-25 — End: 2019-11-18

## 2019-11-18 MED ORDER — DIPHENHYDRAMINE HCL 50 MG/ML IJ SOLN
INTRAMUSCULAR | Status: AC | PRN
Start: 2019-11-18 — End: 2019-11-18
  Administered 2019-11-18: 12.5 mg via INTRAVENOUS

## 2019-11-18 MED ORDER — LIDOCAINE HCL 1 % IJ SOLN
INTRAMUSCULAR | Status: AC | PRN
Start: 2019-11-18 — End: 2019-11-18
  Administered 2019-11-18: 10 mL via INTRADERMAL

## 2019-11-18 MED ORDER — FAMOTIDINE 20 MG PO TABS
20.0000 mg | ORAL_TABLET | Freq: Two times a day (BID) | ORAL | 0 refills | Status: AC
Start: 2019-11-18 — End: 2019-12-18

## 2019-11-18 MED ORDER — HYDRALAZINE HCL 50 MG PO TABS
100.0000 mg | ORAL_TABLET | Freq: Three times a day (TID) | ORAL | Status: DC
Start: 2019-11-18 — End: 2019-11-19
  Administered 2019-11-18 – 2019-11-19 (×4): 100 mg via ORAL
  Filled 2019-11-18 (×4): qty 2

## 2019-11-18 MED ORDER — ONDANSETRON HCL 4 MG/2ML IJ SOLN
INTRAMUSCULAR | Status: AC | PRN
Start: 2019-11-18 — End: 2019-11-18
  Administered 2019-11-18: 4 mg via INTRAVENOUS

## 2019-11-18 MED ORDER — LIDOCAINE-EPINEPHRINE 1 %-1:100000 IJ SOLN
INTRAMUSCULAR | Status: AC
Start: 2019-11-18 — End: ?
  Filled 2019-11-18: qty 20

## 2019-11-18 MED ORDER — LIDOCAINE HCL 1 % IJ SOLN
INTRAMUSCULAR | Status: AC
Start: 2019-11-18 — End: ?
  Filled 2019-11-18: qty 20

## 2019-11-18 MED ORDER — APIXABAN 5 MG PO TABS
10.0000 mg | ORAL_TABLET | Freq: Two times a day (BID) | ORAL | Status: DC
Start: 2019-11-18 — End: 2019-11-18

## 2019-11-18 MED ORDER — PREDNISONE 10 MG PO TABS
ORAL_TABLET | ORAL | 0 refills | Status: AC
Start: 2019-11-19 — End: 2019-12-01

## 2019-11-18 MED ORDER — APIXABAN 5 MG PO TABS
ORAL_TABLET | ORAL | 0 refills | Status: AC
Start: 2019-11-18 — End: 2019-12-18

## 2019-11-18 MED ORDER — ONDANSETRON HCL 4 MG/2ML IJ SOLN
INTRAMUSCULAR | Status: AC
Start: 2019-11-18 — End: ?
  Filled 2019-11-18: qty 2

## 2019-11-18 SURGICAL SUPPLY — 4 items
6FX60CM SINGLE LUMEN VENOUS CATH (Catheter) ×1
KIT CATHETER L60 CM OD6 FR PRO-LINE 1 (Catheter) ×1
KIT CATHETER L60 CM OD6 FR PRO-LINE 1 LUMEN BASIC (Catheter) ×1 IMPLANT
SL HOHN 6 FR Pro-line CT (Catheter) ×2 IMPLANT

## 2019-11-18 NOTE — H&P (Signed)
BRIEF VIR H&P    Date Time: 11/18/19 11:22 AM    PROCEDURALIST COMMENTS BELOW:   44 yo M with cardiomyopathy presenting for Hohn catheter placement for therapy.    INDICATIONS:   Procedure(s):  SL HOHN CATH PLACEMENT    Preoperative Diagnosis:   Pre-Op Diagnosis Codes:     * Heart failure, unspecified HF chronicity, unspecified heart failure type [I50.9]    PAST MEDICAL HISTORY:   No past medical history on file.    ALLERGIES:     Allergies   Allergen Reactions    Isosorb Dinitrate-Hydralazine Other (See Comments)     Severe Headache  Severe Headache      Sulfamethoxazole-Trimethoprim Itching and Rash     Other reaction(s): Hives  Other reaction(s): Hives      Bacitracin Rash       PAST SURGICAL HISTORY   No past surgical history on file.    REVIEW OF SYSTEMS REVIEWED:   YES  ( x )      CURRENT MEDICATION     Prior to Admission medications    Medication Sig Start Date End Date Taking? Authorizing Provider   busPIRone (BUSPAR) 5 MG tablet Take 5 mg by mouth 2 (two) times daily 10/06/19   [provider]   ferrous sulfate 325 (65 FE) MG EC tablet Take 325 mg by mouth every other day 02/19/19   [provider]   furosemide (LASIX) 40 MG tablet Take 40 mg by mouth daily 02/19/19   [provider]   hydrALAZINE (APRESOLINE) 50 MG tablet Take 50 mg by mouth 3 (three) times daily 10/06/19   [provider]   hydrOXYzine (ATARAX) 25 MG tablet Take 12.5 mg by mouth 3 (three) times daily as needed 10/06/19   [provider]   isosorbide dinitrate (ISORDIL) 30 MG tablet Take 30 mg by mouth 3 (three) times daily 10/06/19   [provider]   magnesium oxide (MAG-OX) 400 MG tablet Take 800 mg by mouth 2 (two) times daily 10/06/19   [provider]   metoprolol succinate XL (TOPROL-XL) 25 MG 24 hr tablet Take 25 mg by mouth daily 10/06/19   [provider]   Milrinone Lactate in Dextrose 40-5 MG/200ML-% Solution Infuse 0.25 mcg/kg/min into the vein continuous 10/05/19    [provider]   mirtazapine (REMERON) 7.5 MG tablet Take 7.5 mg by mouth nightly 10/06/19   [provider]   Multiple Vitamins-Minerals (multivitamin with minerals) tablet Take 1 tablet by mouth daily 10/07/19   [provider]   naloxone (Narcan) 4 MG/0.1ML nasal spray 1 spray by Nasal route as needed    [provider]   nystatin (NYSTOP) powder Apply 1 application topically as needed 10/06/19   [provider]   polyethylene glycol (MIRALAX) 17 GM/SCOOP powder Take 17 g by mouth as needed 10/07/19   [provider]   Thiamine HCl (VITAMIN B-1 PO) Take 100 mg by mouth daily 04/24/19   [provider]   triamcinolone (KENALOG) 0.1 % ointment Apply 1 application topically as needed 09/03/19   [provider]   Veltassa 8.4 g Pack Take 1 packet by mouth daily 10/06/19   [provider]   Wound Cleansers (Vashe Cleansing) Solution Apply 1 applicator topically as needed 09/03/19   [provider]         SOCIAL HISTORY     Social History     Socioeconomic History    Marital status:  Single     Spouse name: Not on file    Number of children: Not on file    Years of education: Not on file    Highest education level: Not on file   Occupational History    Not on file   Tobacco Use    Smoking status: Former Smoker     Packs/day: 0.25     Years: 20.00     Pack years: 5.00     Types: Cigarettes     Quit date: 09/06/2019     Years since quitting: 0.2    Smokeless tobacco: Never Used   Vaping Use    Vaping Use: Former   Substance and Sexual Activity    Alcohol use: Not Currently     Alcohol/week: 2.0 standard drinks     Types: 2 Cans of beer per week     Comment: socially    Drug use: Never    Sexual activity: Not on file   Other Topics Concern    Not on file   Social History Narrative    Not on file     Social Determinants of Health     Financial Resource Strain:     Difficulty of Paying Living Expenses:    Food Insecurity:     Worried  About Programme researcher, broadcasting/film/video in the Last Year:     Barista in the Last Year:    Transportation Needs:     Freight forwarder (Medical):     Lack of Transportation (Non-Medical):    Physical Activity:     Days of Exercise per Week:     Minutes of Exercise per Session:    Stress:     Feeling of Stress :    Social Connections:     Frequency of Communication with Friends and Family:     Frequency of Social Gatherings with Friends and Family:     Attends Religious Services:     Active Member of Clubs or Organizations:     Attends Engineer, structural:     Marital Status:    Intimate Partner Violence:     Fear of Current or Ex-Partner:     Emotionally Abused:     Physically Abused:     Sexually Abused:          PHYSICAL EXAM     AIRWAY CLASSIFICATION:    CLASS I   (  )     CLASS II  ( x )    CLASS III  (  )     CLASS IV  (  )    CARDIAC:   RRR  LUNGS:   NLBRA  ABDOMEN:   Soft, NT  NEURO:   AAO x3  OTHER:       LABS:     Recent Labs   Lab 11/18/19  0450 11/17/19  0516 11/16/19  0802   WBC 9.19 8.36 9.80*   RBC 2.87* 2.81* 3.00*   Hgb 7.7* 7.4* 8.0*   Hematocrit 24.2* 23.8* 25.2*     Recent Labs   Lab 11/18/19  0450 11/17/19  0516 11/16/19  1359 11/13/19  0008 11/12/19  1749   Glucose 119* 95 82  More results in Results Review 96   BUN 43.0* 46.0* 48.0*  More results in Results Review 54.0*   Creatinine 2.3* 2.3* 2.2*  More results in Results Review 2.4*   Sodium 133* 131* 134*  More results in Results Review  133*   CO2 15* 15* 15*  More results in Results Review 15*   Albumin  --   --  2.1*  --  2.0*   AST (SGOT)  --   --  27  --  41*   ALT  --   --  36  --  29   More results in Results Review = values in this interval not displayed.     Recent Labs   Lab 11/14/19  1917 11/12/19  1807   PT INR  --  1.4*   PTT 30 30       ASA PHYSICAL STATUS   (  )  ASA 1   HEALTHY PATIENT  (  )  ASA 2   MILD SYSTEMIC ILLNESS  (x)  ASA 3   SYSTEMIC DISEASE, NOT INCAPACITATING  (  )  ASA 4   SEVERE SYSTEMIC  DISEASE, IS CONSTANT THREAT TO  LIFE  (  )  ASA 5   MORIBUND CONDITION, NOT EXPECTED TO LIVE >24 HOURS            IRRESPECTIVE OF PROCEDURE  (  )  E           EMERGENCY PROCEDURE     PLANNED SEDATION:   (  ) NO SEDATION  (x  ) MODERATE SEDATION  (  ) DEEP SEDATION WITH ANESTHESIA    (  ) DNR Rescinded for precedure and d/w patient/family member who agree and understand.       CONCLUSION:   PATIENT HAS BEEN REASSESSED IMMEDIATELY PRIOR TO THE PROCEDURE AND IS AN APPROPRIATE CANDIDATE FOR THE PLANNED SEDATION AND   PROCEDURE.  RISKS, BENEFITS AND ALTERNATIVES TO THE PLANNED PROCEDURE AND SEDATION HAVE BEEN EXPLAINED TO THE PATIENT   OR GUARDIAN/FAMILY THOROUGHLY. MAJOR RISKS RE INFECTION, BLEEDING, INTERNAL ORGAN INJURY, AND RECURRENCE WERE DISCUSSED.    (x)  YES  (  )  EMERGENCY CONSENT       Jerrilynn Mikowski K. Melvyn Neth, MD    [facility]  [facility], Division of Vascular & Interventional Radiology  [phone]  [place] - [phone]  [phone]

## 2019-11-18 NOTE — Progress Notes (Addendum)
Home Health Referral      Referral from Philomena Doheny (Case Manager) for home health care upon discharge.    By [employer], the patient has the right to freely choose a home care provider.    Arrangements have been made with:     A company of the patients choosing. We have supplied the patient with a listing of providers in your area who asked to be included and participate in Medicare.    [facility], formerly [facility], a home care agency that provides adult home care services and participates in Medicare    The preferred provider of your insurance company. Choosing a home care provider other than your insurance company's preferred provider may affect your insurance coverage.      Home Health Discharge Information    Your doctor has ordered Skilled Nursing in-home service(s) for you while you recuperate at home, to assist you in the transition from hospital to home.    The agency that you or your representative chose to provide the service:  Name of Home Health Agency Placement: Other (comment box) [facility])   [phone]      The Infusion Company:  Name of Infusion Company Placement: Other (comment) ([facility])]  Infusion: Start on: Date: [phone]    Continuous milrinone 0.25 mcg/kg/min  Dosing weight 73.1 kg     The above services were set up by:  Elodia Florence [facility] Liaison) Phone   [phone]      IF YOU HAVE NOT HEARD FROM YOUR HOME YOUR HOME HEALTH AGENCY WITHIN 24-48 HOURS AFTER DISCHARGE PLEASE CALL YOUR AGENCY TO ARRANGE A TIME FOR YOUR FIRST VISIT. FOR ANY SCHEDULING CONCERNS OR QUESTIONS RELATED TO HOME HEALTH, SUCH AS TIME OR DATE PLEASE CONTACT YOUR HOME HEALTH AGENCY AT THE NUMBER LISTED ABOVE.    Additional comments: Patient is active with the above agencies.  I spoke with Erskine Squibb from [facility] to coordinate discharge.  Pump and milrinone to be delivered tomorrow.  Have not been given a time yet.  They are unable to send a nurse to connect  because it is out of their service area, so patient will need to connect. 9/17.21 Spoke with Erskine Squibb at [facility] reports that she has the orders but cannot find the medication, Milrionone, noted. This PACC attempted to talk her through the order to point it out, but was unsuccessful.  Faxed milrinone order over with highlights.  Erskine Squibb reports that pump and medication will be delivered this afternoon but does not have a specific time.     11/19/19 Spoke with Erskine Squibb, RN at [facility] [phone]).  Confirmed that all supplies, including Dapto, will be delivered to bedside this afternoon.  Shipment will leave at noon, and should be delivered by 3p.  Erskine Squibb spoke with patient's mom and Mom has taken responsibility of infusing Daptomycin.  Infusion nurse will facetime patient and Mom tomorrow to teach IV abx infusion.  This infusion nurse states that she has met with Ms Snyders via facetime before to troubleshoot pump issues with milrinone.  States that she is capable and comfortable.        HOME HEALTH REFERRAL    PATIENT DEMOGRAPHICS:        Name: [name] [name]    Discharge Address: [address] [place] [address]      Primary Telephone Number: [phone]  Emergency Contact and Number: Extended Emergency Contact Information  Primary Emergency  Contact: linda,Deguire  Mobile Phone: [phone]  Relation: Mother  Interpreter needed? 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o. male. He has a past medical history of NICM (EF 15-20%, JUL2021)on home milrinone, HS (on prednisone),and CKD3 with 5 days ofworsening myalgias, weakness,fever,and dyspnea transferred from [facility] with MRSA bacteremia because he is currently undergoing transplant eval with Dr. Kyung Rudd at [facility] (previously deemed not LVAD/transplant candidate by [facility]).        [employer] (Labs, Wounds, Infusions, etc.):  Daptomycin 450 mg iv q24 hours ; last dose on 11/28/19 [ 6mg / kg iv q24 hr ; crcl 44 ; weight 74.3 kg] Q Monday cbc w diff; cmp; esr; crp and CPK fax to Dr Lorine Bears [phone]   Routine hohn / picc line care     Daptomycin IV daily through 9/26 by midline which will have to be removed on the last day of antibiotics    Continuous milrinone 0.25 mcg/kg/min  Dosing weight 73.1 kg   Needs weekly BMP, mag, CBC. Fax to [phone]    Additional Comments Infusion Pharmacy is [facility]  [phone]    COVID-19 Screening: Not tested    Discharge Date:11/19/2019  Referral Source (PACC/Hospital/Unit): Elodia Florence, RN  Referral Date: 11/18/19     Home Health face-to-face (FTF) Encounter (Order [id number])  Consult  Date: 11/18/2019 [facility] Ordering/Authorizing: Verdene Lennert, MD   Order Information    Order Date/Time Release Date/Time Start Date/Time End Date/Time   11/18/19 04:26 PM None 11/18/19 04:23 PM 11/18/19 04:23 PM   Order Details    Frequency Duration Priority Order Class   Once 1 occurrence Routine Hospital Performed   Standing Order Information    Remaining Occurrences Interval Last Released     0/1 Once 11/18/2019            Provider Information    Ordering User Ordering Provider Authorizing Provider   Eri Mcevers, RN Verdene Lennert, MD Verdene Lennert, MD   Attending Provider(s) Admitting Provider PCP   Casilda Carls, MD; Olin Pia, DO; Marton Redwood, Marzella Schlein, MD; Verdene Lennert, MD Casilda Carls, MD Pcp, None, MD   Verbal Order Info    Action Created on Order Mode Entered by Responsible Provider Signed by Signed on   Ordering 11/18/19 1627 Telephone with Windell Norfolk, RN Verdene Lennert, MD     Comments    [employer] (Labs, Wounds, Infusions, etc.):   Daptomycin 450 mg iv q24 hours ; last dose on 11/28/19 [  6mg / kg iv q24 hr ; crcl 44 ; weight 74.3 kg] Q Monday cbc w diff; cmp; esr; crp and CPK fax to Dr Lorine Bears [phone]   Routine hohn / picc line care   Daptomycin IV daily through 9/26 by midline which will have to be removed on the last day of antibiotics     Continuous milrinone 0.25 mcg/kg/min   Dosing weight 73.1 kg   Needs weekly BMP, mag, CBC. 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    Acute on chronic systolic heart failure    Prior LV thrombus 2018; resolved    PE    CKD, baseline creatinine ~2.2    Hidradenitis suppurativa; on home prednisone    MRSA bacteremia, suspected from PICC line (removed 11/13/19); blood cultures and PICC line culture tip positive for Staph epidermidis    Leukocytosis    Anemia of chronic disease    LUE DVTs; ultrasound 11/14/19 revealed occlusive DVT of L subclavian vein, axillary vein, and brachial veins, occlusive superficial thrombus in L basilic vein    Hyponatremia    Acute pain to RUE; improving today     Assessment: Evyn Putzier is a 44 y.o. male. He has a past medical history of NICM (EF 15-20%, JUL2021) on home milrinone, HS (on prednisone), and CKD3 with 5 days of worsening myalgias, weakness, fever, and dyspnea transferred from [facility] with MRSA bacteremia because he is currently undergoing transplant  eval with Dr. [facility]ung [facility]udd at [facility] (previously deemed not LVAD/transplant candidate by [facility]).     Home nursing required for skilled assessment including cardiopulmonary assessment and dietary education for disease management, and medication instruction.         Order Questions    Question Answer Comment   Date I saw the patient face-to-face: 11/18/2019    Evidence this patient is homebound because: I. Restricted to home to decrease risk of infection     B. Profound weakness, poor balance/unsteady gait d/t illness/treatment/procedure     G. Fall risk due to impaired coordination, gait and decreased balance     K. Chronic condition that leads to high fatigue & SOB with ambulation > 5 feet    Medical conditions that necessitate Home Health care: D. Chronic illness & risk for re-hospitalization due to unstable disease status     C. Risk for complication/infection/pain requiring follow up and monitoring     G. High risk/complex medications requiring instruction and management     K. Central line/IV infusion requiring monitoring and management    Per clinical findings, following services are medically necessary: Skilled Nursing    Clinical findings that support the need for Skilled Nursing. SN will: H. Assess cardiopulmonary status and monitor for signs &symptoms of exacerbation     I. Educate dietary and or fluid restrictions and weight management     F. Instruct on Lovenox or Coumadin use/monitoring/complications     D. Review medication reconciliation, manage and educate on use and side effects     C. Monitor for signs and symptoms of exacerbation of disease and management    Other (please specify) See comments.          Process Instructions    Please select Home Care Services medically necessary.     Based on the above findings, I certify that this patient is confined to the home and needs intermittent skilled nursing care, physical therapry and / or speech  therapy or continues to need occupational therapy. The  patient is under my care, and I have initiated the establishment of the plan of care. This patient will be followed by a physician who will periodically review the plan of care.    Collection Information    Consult Order Info    ID Description Priority Start Date Start Time   [id number] Home Health face-to-face (FTF) Encounter Routine 11/18/2019 4:23 PM   Provider Specialty Referred to   ______________________________________ _____________________________________   Verbal Order Info    Action Created on Order Mode Entered by Responsible Provider Signed by Signed on   Ordering 11/18/19 1627 Telephone with Windell Norfolk, RN Verdene Lennert, MD     Patient Information    Patient Name   [name] [name] Legal Sex   Male DOB   Sep 07, 1975   Additional Information    Associated Reports External References   Priority and Order Details [facility]  [facility]        Patient Name: VITALY, WANAT     MRN: [id number]     CSN: [id number]       Account Information    Hosp Acct #   [id number] Patient Class   Inpatient Service  Medicine Accommodation Code  Telemetry     Admission Information    Admitting Physician:  Attending Physician: Casilda Carls, MD  Verdene Lennert, MD Unit  [facility] CARDIAC* L&D Status     Admitting Diagnosis: End stage heart failure; Cardiomyopathy Room / Bed  FI315/FI315-01 L&D - Last Menstrual Cycle     Chief Complaint:      Admit Type:  Admit Date/Time:  Discharge Date/Time: Urgent  11/12/2019 / 1644   /  Length of Stay: 6 Days   L&D EDD   Estimated Date of Delivery: None noted.     Patient Information            Home Address: [address] [place] [address] Employer:  Employer Address:     ,     Main Phone: [phone] Employer Phone:    SSN: [id number]     DOB: 30-Mar-1975 (44 yrs)     Sex: Male Primary Care Physician: Pcp, None, MD   Marital Status: Single Referring Physician:       Casilda Carls, MD   Race: Black or African American      Ethnicity: Unavailable     Emergency Contacts  Name Home Phone Work Phone Mobile Phone Relationship SAMUELE, STOREY   [phone] Mother No        Guarantor Information    Guarantor Name: TAKEEM, KROTZER ID: [id number]   Guarantor Relationship to Pt: Self Guarantor Type: Personal/Family   Guarantor DOB:   May 08, 1975     Guarantor Address: [address] [place], [place] [address]       Guarantor Home Phone: [phone] Guarantor Employer:        Pensions consultant Work Phone:  Pharmacologist Phone:               Primary Insurance    Insurance Name: [facility]* Subscriber Name: [employer] Address:   [address] [address]  [place], [place] [address] Subscriber DOB: 02-24-1976     Subscriber ID: [id number]   Insurance Phone: [phone] Pt Relationship to Sub:   Self   Insurance ID:      Group Name:  Preauthorization #:  Group #: [id number] Preauthorization Days:      Art gallery manager Name: - Statistician Name:    Nurse, children's:     ,   Statistician DOB:      Subscriber ID:    Press photographer:  Pt Relationship to Sub:      Insurance ID:      Group Name:  Preauthorization #:    Group #:  Preauthorization Days:      [employer] Name: - Subscriber Name:    Community education officer Address:     ,   Statistician DOB:      Subscriber ID:    Press photographer:  Pt Relationship to Sub:      Insurance ID:      Group Name:  Preauthorization #:    Group #:  Preauthorization Days:        11/18/2019 4:27 PM         Signed by: Elodia Florence  Date Time: 11/18/19 4:10 PM

## 2019-11-18 NOTE — Progress Notes (Signed)
Pt name/Code: [name] [name] (44 y.o. male);Full Code  Admit Date/Dx: 11/12/2019 Cardiomyopathy  Weights:   Last 3 Weights for the past 72 hrs (Last 3 readings):   Weight   11/18/19 0800 74.3 kg (163 lb 12.8 oz)   11/17/19 0523 74.4 kg (164 lb)   11/16/19 0424 74.5 kg (164 lb 3.2 oz)       Shift comment: Pt had Hohn cath placed today. Heparin gtt d/c. Home health has not been set up so was not able to d/c home today.     Neuro: AOx 4  Pain: pain in both arms, oxycodone given once  Rhythm on Tele: SR   Oxygen/Airway: RA  GI:  Continent y Last BM Date: 11/17/19  GU: Continent y  Diet: Diet NPO time specified  IV Access: Midline @ RUA, 22 G LFA, Hohn cath Rt chest  Falls Risk: Moderate  Ambulation: Standby  Plan:   - D/C home with IV antibiotic and milrinone  - Setup transportation for d/c  - Possible d/c 9/17  - Pain management  - Continue Milrinone 0.25 mcg/kg/hr  - Continue Daptomyacin

## 2019-11-18 NOTE — Progress Notes (Signed)
Home Health face-to-face (FTF) Encounter (Order [id number])  Consult  Date: 11/18/2019 Department: [facility] Ordering/Authorizing: Golden Circle, MD   Order Information    Order Date/Time Release Date/Time Start Date/Time End Date/Time   11/18/19 01:54 PM None 11/18/19 01:55 PM 11/18/19 01:55 PM   Order Details    Frequency Duration Priority Order Class   Once 1 occurrence Routine Hospital Performed   Standing Order Information    Remaining Occurrences Interval Last Released     0/1 Once 11/18/2019           Provider Information    Ordering User Ordering Provider Authorizing Provider   Golden Circle, MD Golden Circle, MD Golden Circle, MD   Attending Provider(s) Admitting Provider PCP   Casilda Carls, MD; Olin Pia, DO; Marton Redwood, Marzella Schlein, MD; Verdene Lennert, MD Casilda Carls, MD Pcp, None, MD   Comments    Daptomycin 450 mg iv q24 hours ; last dose on 11/28/19 [ 6mg / kg iv q24 hr ; crcl 44 ; weight 74.3 kg]   Q Monday cbc w diff; cmp; esr; crp and CPK fax to Dr Lorine Bears [phone]   Routine hohn / picc line care         Order Questions    Question Answer Comment   Date I saw the patient face-to-face: 11/18/2019    Evidence this patient is homebound because: I. Restricted to home to decrease risk of infection    Medical conditions that necessitate Home Health care: K. Central line/IV infusion requiring monitoring and management    Per clinical findings, following services are medically necessary: Skilled Nursing    Clinical findings that support the need for Skilled Nursing. SN will: E. Educate on IV antibiotic administration and monitor response to treatment    IVs IV Antibiotics     Central Line Care          Process Instructions    Please select Home Care Services medically necessary.     Based on the above findings, I certify that this patient is confined to the home and needs intermittent skilled nursing care, physical therapry and / or speech therapy or continues to  need occupational therapy. The patient is under my care, and I have initiated the establishment of the plan of care. This patient will be followed by a physician who will periodically review the plan of care.    Collection Information    Consult Order Info    ID Description Priority Start Date Start Time   [id number] Home Health face-to-face (FTF) Encounter Routine 11/18/2019 1:55 PM   Provider Specialty Referred to   ______________________________________ _____________________________________   Patient Information    Patient Name   [name], [name] Legal Sex   Male DOB   November 15, 1975   Additional Information    Associated Reports External References   Priority and Order Details [employer]        [facility]  [facility]        Patient Name: [name] [name], [name] [name]     MRN: [id number]     CSN: [id number]       Account Information    Hosp Acct #   [id number] Patient Class   Inpatient Service  Medicine Accommodation Code  Telemetry     Admission Information    Admitting Physician:  Attending Physician: Casilda Carls, MD  Verdene Lennert, MD Unit  [facility] CARDIAC* L&D Status     Admitting Diagnosis: End stage  heart failure; Cardiomyopathy Room / Bed  FI315/FI315-01 L&D - Last Menstrual Cycle     Chief Complaint:      Admit Type:  Admit Date/Time:  Discharge Date/Time: Urgent  11/12/2019 / 1644   /  Length of Stay: 6 Days   L&D EDD   Estimated Date of Delivery: None noted.     Patient Information            Home Address: [address] [place] [address] Employer:  Employer Address:     ,     Main Phone: [phone] Employer Phone:    SSN: [id number]     DOB: Jan 02, 1976 (44 yrs)     Sex: Male Primary Care Physician: Pcp, None, MD   Marital Status: Single Referring Physician:       Casilda Carls, MD   Race: Black or African American     Ethnicity: Unavailable     Emergency Contacts  Name Home Phone Work Phone Mobile Phone Relationship [place], CURRENT   [phone] Mother No         Guarantor Information    Guarantor Name: [name], [name] ID: [id number]   Guarantor Relationship to Pt: Self Guarantor Type: Personal/Family   Guarantor DOB:   [birth year]     Guarantor Address: [address] [place], [place] [address]       Guarantor Home Phone: [phone] Guarantor Employer:        Pensions consultant Work Phone:  Pharmacologist Phone:               Primary Insurance    Insurance Name: [facility]* Subscriber Name: [employer] Address:   [address] [address]  [place], [place] [address] Subscriber DOB: [birth year]     Subscriber ID: [id number]   Insurance Phone: [phone] Pt Relationship to Sub:   Self   Insurance ID:      Group Name:  Preauthorization #:    Group #: [id number] Preauthorization Days:      Art gallery manager Name: - Statistician Name:    Nurse, children's:     ,   Statistician DOB:      Subscriber ID:    Press photographer:  Pt Relationship to Sub:      Insurance ID:      Group Name:  Preauthorization #:    Group #:  Preauthorization Days:      [employer] Name: Scientist, clinical (histocompatibility and immunogenetics) Name:    Community education officer Address:     ,   Statistician DOB:      Subscriber ID:    Press photographer:  Pt Relationship to Sub:      Insurance ID:      Group Name:  Preauthorization #:    Group #:  Preauthorization Days:        11/18/2019 2:27 PM

## 2019-11-18 NOTE — Progress Notes (Signed)
ID PROGRESS NOTE    Date Time: 11/18/19 9:03 AM  Patient Name: [name] [name],[name] [name]        Subjective:    afebrile; BP elevated this AM  Sleeping comfortably  Denies chest pain, cough or dyspnea  Denied any N/V, abdominal pain or diarrhea  No fevers or chills  Denied any myalgias on daptomycin  No rashes or urinary symptoms    Medications:     prednisone  daptomycin # 4, total ABX # 6    S/p ceftaroline     Lines:     CENTRAL LINES: none  Right arm midline placed on 9/11 no erythema nontender    Review of Systems:       As above    Physical Exam:     Vitals:    11/18/19 0820   BP: (!) 180/96   Pulse: 97   Resp: 18   Temp: 97.6 F (36.4 C)   SpO2: 99% ra   tmax 97.7    GEN: NAD, nontoxic, in bed, comfortable, sleeping, easily arousable  HEENT: AT/NC  CV: RRR  PULM: CTA bilaterally, slightly diminished BS at the bases  ABD: Soft, NT, NABS, no G/R  EXT: No BLE edema  DERM: No rashes, old healed scars from prior hidradenitis throughout body  NEURO: No facial droop    Labs:       Recent Labs   Lab 11/18/19  0450 11/17/19  0516 11/16/19  0802   WBC 9.19 8.36 9.80*   RBC 2.87* 2.81* 3.00*   Hgb 7.7* 7.4* 8.0*   Hematocrit 24.2* 23.8* 25.2*   MCV 84.3 84.7 84.0   MCHC 31.8 31.1* 31.7   RDW 22* 22* 22*   MPV 9.3 10.0 10.1   Platelets 310 295 289       Recent Labs   Lab 11/18/19  0450 11/17/19  0516 11/16/19  1359 11/15/19  0426 11/14/19  1520 11/14/19  0538 11/14/19  0538 11/13/19  0545 11/13/19  0545   Sodium 133* 131* 134* 132* 133*  More results in Results Review 129*  More results in Results Review 130*   Potassium 4.7 5.0 4.5 5.8* 4.9  More results in Results Review 5.6*  More results in Results Review 5.0   Chloride 110 108 110 109 109  More results in Results Review 107  More results in Results Review 108   CO2 15* 15* 15* 14* 14*  More results in Results Review 13*  More results in Results Review 14*   BUN 43.0* 46.0* 48.0* 53.0* 55.0*  More results in Results Review 57.0*  More results in Results Review 54.0*    Creatinine 2.3* 2.3* 2.2* 2.2* 2.3*  More results in Results Review 2.4*  More results in Results Review 2.3*   Glucose 119* 95 82 118* 135*  More results in Results Review 128*  More results in Results Review 93   Calcium 7.9* 7.8* 7.7* 7.7* 7.9*  More results in Results Review 8.1*  More results in Results Review 8.0*   Magnesium  --  1.6  --   --   --   --  2.0  --  2.0   More results in Results Review = values in this interval not displayed.       Recent Labs   Lab 11/16/19  1359 11/12/19  1749   ALT 36 29   AST (SGOT) 27 41*   Bilirubin, Total 0.9 2.3*   Albumin 2.1* 2.0*   Alkaline Phosphatase 154*  182*       MICRO  9/12 blood cx ngtd  9/11 cath tip CX <15 CFU S. aureus & <15 CFU Staph epidermidis  9/10 MRSA nares, throat negative  9/10 blood CX Staph epidermidis    Rads:     9/13 ultrasound abdomen  1. Partially contracted gallbladder without cholelithiasis orcholecystitis.  2. Echogenic kidneys compatible with medical renal disease.    9/13 ultrasound carotid Dopplers  1. No signal plaque in the right carotid bulb and proximal internal  carotid artery with no hemodynamically significant stenosis.  2. Minimal plaque in the left carotid bulb and proximal internal carotid  artery with less than 50% and no hemodynamically significant stenosis.    9/13 arterial Dopplers  No arterial insufficiency of the lower extremities at rest with  unremarkable noninvasive studies.    9/13 CT chest abdomen pelvis  CT of the chest shows minimal left basilar atelectasis. The  lungs are otherwise clear. The examination shows an unremarkable  appearance of the thoracic aorta. Note the limitations of this study  which was performed without contrast. Cardiomegaly is noted.    CT scan of the abdomen and pelvis shows minimal pelvic ascites of  uncertain etiology. No acute appearing bowel abnormalities are seen. The  examination shows mild body wall edema. A normal-appearing appendix is  Visualized.    9/13 ultrasound RUE  1.  Superficial and deep venous thrombosis right upper extremity as above.  2. Nonocclusive thrombus left innominate vein.    9/12 US doppler LUE  1. Occlusive deep venous thrombosis of the left subclavian vein,  axillary vein, and brachial veins.  2. Occlusive superficial thrombus in the left basilic vein.    9/10 TTE    * The left ventricle is severely dilated.    * Left ventricular wall thickness is normal.    * Severe left ventricular global hypokinesis.    * Left ventricular systolic function is severely decreased with an ejection  fraction by Biplane Method of Discs of  27 %.    * The right ventricular cavity size is severely dilated.    * Decreased right ventricular systolic function.    * TAPSE = 1.8 cm, S' = 11 cm/s, fractional area change = 21 %.    * The left atrium is severely dilated.    * The right atrium is severely dilated.    * There is mild mitral regurgitation.    * There is mild tricuspid regurgitation.    * No valvular vegetations visualized.    * Severe pulmonary hypertension with estimated right ventricular systolic  pressure of  73 mmHg.    * The ascending aorta is mildly dilated.    * Trivial circumferential pericardial effusion visualized.    * No prior study is available for comparison.      Assessment:   44 year old male, Hx of nonischemic cardiomyopathy on home milrinone via PICC line, LV thrombus, PE, CKD, severe hidradenitis suppurativa s/p multiple surgeries, multiple skin infections, treated with Stelara and now on prednisone    9/7: Admission to [facility]  -Fevers, tachycardia, myalgias, leukocytosis  -MRSA bacteremia (9/7)    9/10:Tx'd to [facility]  -9/10: Bacteremia with staph epidermidis  -9/11 PICC removed, midline placed  -Cath tip culture S. aureus (not speciated), staph epidermidis  -Extensive thrombus: Left subclavian, axillary and brachial veins  -Superficial thrombus & DVT right cephalic right  basilic vein    Covid NONIMMUNE, declines vaccine    Plan:   1.  Bacteremia:  MRSA, Staph epidermidis  -Source: PICC line (now removed)  - on daptomycin (CrCl 44), will continue.  -Side effects D/W PT  - baseline CK on 9/15 = 14, tolerating daptomycin no myalgias  -2D echo 9/10 no vegetations  -Repeat blood CX from 9/12 remain NGTD  -Would need a 2-week course last negative ( blood cultures-through 9/26)   HOHN catheter to be placed today- D/w Dr Clydene Fake    2. FTF completed yesterday/ in chart     F/u 1 week    [place]: [place] [id number]

## 2019-11-18 NOTE — Sedation Documentation (Signed)
Dr. Melvyn Neth completed right chest SL hohn without complication. Patient tolerated procedure well. VS stable. Scant bleeding at site, no hematoma noted to access site. Dressing applied by IR tech, Marchelle Folks. Patient denies pain. Report called to receiving RN. To be transported to inpatient bed via bedside Rn and transport.    Sedation given, see MAR for total doses. Vital signs noted in flowsheets.

## 2019-11-18 NOTE — Plan of Care (Addendum)
Pt name/Code: [name] [name] (44 y.o. male);Full Code  Admit Date/Dx: 11/12/2019 Cardiomyopathy  Weights: Last 3 Weights for the past 72 hrs (Last 3 readings):   Weight   11/17/19 0523 74.4 kg (164 lb)   11/16/19 0424 74.5 kg (164 lb 3.2 oz)   11/15/19 0700 74.6 kg (164 lb 6.4 oz)       Shift comment:   Pt anti-Xa was therapeutic twice, last two 0.53, 0.59. No titration, next anti-Xa @ 4:00 am. Pt refused morning hydralazine stating he cannot eat or drink anything due to the procedure. I explained several times but still refused. Will continue to monitor.    Neuro: AOx 4  Pain: pain in both arms, oxycodone given once  Rhythm on Tele: SR   Oxygen/Airway: RA  GI:  Continent y Last BM Date: 11/17/19  GU: Continent y  Diet: Diet NPO time specified  IV Access: Midline @ RUA, 22 G LFA  Falls Risk: Moderate  Ambulation: Standby  Plan:   - Placement of Hohn 9/16 (NPO)  - D/C home with IV antibiotic and milrinone  - Setup transportation for d/c  - Possible d/c 9/16-  - Pain management.         Problem: Compromised Tissue integrity  Goal: Damaged tissue is healing and protected  11/18/2019 0336 by Everlean Cherry, RN  Outcome: Progressing  11/18/2019 0326 by Everlean Cherry, RN  Outcome: Progressing  Goal: Nutritional status is improving  11/18/2019 0336 by Everlean Cherry, RN  Outcome: Progressing  11/18/2019 0326 by Everlean Cherry, RN  Outcome: Progressing     Problem: Day of Admission - Heart Failure  Goal: Heart Failure Admission  11/18/2019 0336 by Everlean Cherry, RN  Outcome: Progressing  11/18/2019 0326 by Everlean Cherry, RN  Outcome: Progressing     Problem: Everyday - Heart Failure  Goal: Stable Vital Signs and Fluid Balance  11/18/2019 0336 by Everlean Cherry, RN  Outcome: Progressing  11/18/2019 0326 by Everlean Cherry, RN  Outcome: Progressing  Goal: Mobility/Activity is Maintained at Optimal Level for Patient  11/18/2019 0336 by Everlean Cherry, RN  Outcome: Progressing  11/18/2019 0326  by Everlean Cherry, RN  Outcome: Progressing  Goal: Nutritional Intake is Adequate  11/18/2019 0336 by Everlean Cherry, RN  Outcome: Progressing  11/18/2019 0326 by Everlean Cherry, RN  Outcome: Progressing  Goal: Teaching-Using CHF Warning Zones and Educational Videos  11/18/2019 0336 by Everlean Cherry, RN  Outcome: Progressing  11/18/2019 0326 by Everlean Cherry, RN  Outcome: Progressing     Problem: Inadequate Cardiac Output  Goal: Adequate tissue perfusion will be maintained  11/18/2019 0336 by Everlean Cherry, RN  Outcome: Progressing  11/18/2019 0326 by Everlean Cherry, RN  Outcome: Progressing     Problem: Safety  Goal: Patient will be free from injury during hospitalization  11/18/2019 0336 by Everlean Cherry, RN  Outcome: Progressing  11/18/2019 0326 by Everlean Cherry, RN  Outcome: Progressing  Goal: Patient will be free from infection during hospitalization  11/18/2019 0336 by Everlean Cherry, RN  Outcome: Progressing  11/18/2019 0326 by Everlean Cherry, RN  Outcome: Progressing     Problem: Pain  Goal: Pain at adequate level as identified by patient  11/18/2019 0336 by Everlean Cherry, RN  Outcome: Progressing  11/18/2019 0326 by Everlean Cherry, RN  Outcome: Progressing     Problem: Side Effects from Pain Analgesia  Goal: Patient will experience minimal side effects of analgesic therapy  11/18/2019 0336 by Everlean Cherry, RN  Outcome: Progressing  11/18/2019 0326 by  Everlean Cherry, RN  Outcome: Progressing     Problem: Discharge Barriers  Goal: Patient will be discharged home or other facility with appropriate resources  11/18/2019 0336 by Everlean Cherry, RN  Outcome: Progressing  11/18/2019 0326 by Everlean Cherry, RN  Outcome: Progressing     Problem: Psychosocial and Spiritual Needs  Goal: Demonstrates ability to cope with hospitalization/illness  11/18/2019 0336 by Everlean Cherry, RN  Outcome: Progressing  11/18/2019 0326 by Everlean Cherry, RN  Outcome:  Progressing     Problem: Moderate/High Fall Risk Score >5  Goal: Patient will remain free of falls  11/18/2019 0336 by Everlean Cherry, RN  Outcome: Progressing  11/18/2019 0326 by Everlean Cherry, RN

## 2019-11-18 NOTE — Discharge Instructions (Signed)
Discharge Instructions for Deep Vein Thrombosis (DVT)  A blood clot or thrombus that forms in a large, deep vein is called a deep vein thrombosis (DVT). If a DVT is not treated, part of the clot (embolus) can break off and travel to your lungs. This is called a pulmonary embolus (PE). This can cut off the flow of blood to part or all of the lung. PE is a medical emergency and may cause death.   Healthcare providers use the term venous thromboembolism (VTE) to describe these two conditions: DVT and PE. They use the term VTE because the two conditions are very closely related. And because their prevention and treatment are also closely related.   Follow all instructions for taking your medicine, follow-up care, and diet and lifestyle changes.  Medicine  Your healthcare provider will usually prescribe a blood-thinner (anticoagulant) medicine. This medicine helps prevent new blood clots. Blood thinners can be given by mouth (oral), by shot (injection), or into your vein (intravenous or IV). Commonly used blood thinners include warfarin and heparin. Newer blood thinners may also be used. They include rivaroxaban, apixaban, dabigatran, and enoxaparin. Your healthcare provider will give specific instructions on how to take your medicine. You may take more than one type for a period of time.  Take your blood thinner exactly as directed. If you miss a dose, call your healthcare provider to find out what you should do. These medicines increase the chance of bleeding. So it's very important to take them correctly. Be sure to tell all of your healthcare providers, including dentists, that you are taking a blood thinner.  Follow-up monitoring  You’ll need to have your blood tested on a regular schedule. Your healthcare provider will tell you how often you need to have your blood tested. This is to make sure you’re taking the right amount of warfarin. Too much can cause excess bleeding, which can be very serious. Too little may  not prevent blood clots from harming you.  The blood tests check your international normalized ratio (INR) and prothrombin time (PT). These show how quickly your blood clots. Together the test is called PT/INR.  You may need to visit a hospital or clinic to have your blood tested. Or a nurse may come to your home and test your blood. In some cases, you may be able to test your blood at home with a small machine. Talk with your healthcare provider to find out what’s best for you. Don't miss any appointments to get your blood tested. If you have a blood test outside of your healthcare provider’s office, make sure to call him or her as soon as you get your test results.  After the blood test, your healthcare provider may tell you to change your dose of warfarin. Take the medicine exactly as directed. Don’t stop taking it unless your healthcare provider tells you to.  Diet and warfarin  Vitamin K can interact with warfarin and reduce its ability to thin your blood. Vitamin K helps your blood clot. So sudden changes in vitamin K intake can affect the way warfarin works. You don’t need to stop eating foods with vitamin K. Instead, keep the amount you eat about the same each day. Foods high in vitamin K include:  · Leafy green vegetables such as spinach, cabbage, and kale  · Avocado  · Asparagus  · Egg yolks  · Oils like canola, olive, and soybean  When taking warfarin, don't change your diet without first checking with your healthcare provider.  The other blood thinners don't have the same interaction with vitamin K that warfarin does.   Medicines and your anticoagulant  Some medicines may cause problems with blood thinners. Check with your healthcare provider before making any changes to   your medicines. And don't take over-the-counter (OTC) medicines without checking with your provider. Some medicines interact with your blood thinner and make your blood too thin. This increases your risk of bleeding. Others may stop your  blood thinner from doing its job, making your blood too thick. So it's very important to tell your healthcare provider about all of the medicines you take, including OTCs and herbal supplements. Don't start or stop taking any medicine, including OTCs, unless your healthcare provider tells you to.  Medicines that may cause problems with your blood thinner include:  · Some antibiotics  · Some heart medicines  · Cimetidine  · Aspirin or other nonsteroidal anti-inflammatory drugs (NSAIDs) such as ibuprofen or naproxen.  · Some medicines for depression, cancer, HIV infection, diabetes, seizures, gout, high cholesterol, or thyroid disease  · Vitamins with vitamin K  · Some herbal products such as St. John's wort, garlic, coenzyme Q10, turmeric, and ginkgo biloba  Home care  To help prevent blood clots, try the following:  · Wiggle your toes and move your ankles while sitting or lying down.  · When traveling by car, make frequent stops to get up and move around.  · On long airplane rides, get up and move around when possible. If you can’t get up, wiggle your toes, move your ankles and tighten your calves to keep your blood moving.  · If you have to stay in bed, do leg exercises.  · Wear support or compression stockings, if prescribed by your healthcare provider.  · Rest and put your legs up whenever they feel swollen or heavy.  · Raise the foot of your mattress 5 to 6 inches, using a foam wedge.  Lifestyle changes  To help you stay healthy, especially your heart and blood vessels, you should:  · Start an exercise program, if you are not exercising. Ask your healthcare provider how to get started. Try walking, inside or out.  · Stay at a healthy weight. Get help to lose any extra pounds (kilograms).  · Keep blood pressure in a healthy range  · If you smoke, make a plan to quit. Ask your healthcare provider about stop-smoking programs to help you quit.  Call 911  Call 911 right away if you have the following symptoms. They  may mean a blood clot in your lungs:  · Chest pain  · Trouble breathing  · Fast heartbeat  · Sweating  · Fainting  · Coughing (may cough up blood)  · Heavy or uncontrolled bleeding  When to call your healthcare provider  Call your healthcare provider if you have pain, swelling, or redness in your leg, arm, or other area. These symptoms may mean a blood clot.  If you take blood thinners and are bleeding, you may have:  · Blood in the urine   · Bleeding with bowel movements  · Very dark or tar-like stool  · Vomiting with blood  · Coughing with blood  · Bleeding from the nose  · Bleeding from the gums  · A cut that will not stop bleeding  · Bleeding from the vagina  [employer] last reviewed this educational content on 12/02/2017  © 2000-2021 [employer]. All rights reserved. This information is not intended as a substitute for professional medical care. Always follow your healthcare professional's instructions.

## 2019-11-18 NOTE — Progress Notes (Signed)
Advanced Heart Failure and Transplant Consult Note    Heart Failure/Transplant Spectra Link [id number]    Listed for transplant:  Pending.    Assessment:  [name] [name] is a 44 y.o. male. He has a past medical history of NICM (EF 15-20%, JUL2021)on home milrinone, HS (on prednisone),and CKD3 with 5 days ofworsening myalgias, weakness,fever,and dyspnea transferred from [facility] with MRSA bacteremia because he is currently undergoing transplant eval with Dr. Kyung Rudd at [facility] (previously deemed not LVAD/transplant candidate by [facility]).     NICM, EF15-20%,on home milrinone   Acute on chronic systolic heart failure   Prior LV thrombus 2018; resolved   PE   CKD, baseline creatinine ~2.2   Hidradenitis suppurativa; on home prednisone   MRSA bacteremia, suspected from PICC line (removed 11/13/19); blood cultures and PICC line culture tip positive for Staph epidermidis    Leukocytosis   Anemia of chronic disease   LUE DVTs; ultrasound 11/14/19 revealed occlusive DVT of L subclavian vein, axillary vein, and brachial veins, occlusive superficial thrombus in L basilic vein   Hyponatremia   Acute pain to RUE; improving today    Recommendations:       Metoprolol XL 50 mg daily, Hydralazine 75 mg PO Q8-->100 mg PO TID, Isosorbide dinitrate 30 mg TID.   Milrinone infusion at 0.25 mcg/kg/min   Furosemide 40 mg PO daily   Patient will follow up with Dr. Alba Destine   On Prednisone 30 mg daily for hidradenitis suppurativa   On heparin drip for bilateral UE DVT; transition to Eliquis at discharge   Follow up hypercoagulable work up.   Antibiotic management per primary medicine team; 2 weeks of daptomycin (9/12-9/26). Requires a line for this and milrinone as well.   Follow up blood cultures, repeat sent 11/14/19   Pain management per primary medicine team.     Patient was recently admitted at [facility] and evaluated for advance therapies, but was declined due to recurrent infections secondary to hidradenitis  suppurativa.  Here for second opinion.   Initiating LVAD and transplant evaluation but can be continued as an outpatient.   Hepatitis B vaccine    We are signing off patient. Please re-consult for further questions.    Signed: Viann Shove, MD  -----------------------------------------------------------------------------------------------------------  I have personally interviewed and examined the patient. I have reviewed the provider's history, exam, assessment and management plans. I concur with or have edited all elements of the provider's note.    - Renal dysfunction limits how much we can optimize GDMT- he is on BB, hydral/isordil   - He is milrinone dependent at 0.25 mcg/kg/min  - LVAD and transplant eval can continue as an outpatient  - He is ready for discharge from our standpoint     Argentina Donovan, MD  --------------------------------------------------------------------------------------------------------------    Synopsis: [name] [name] is a 44 y.o. male. He has a past medical history of NICM (EF 15-20%, JUL2021)on home milrinone, HS (on prednisone),and CKD3 with 5 days ofworsening myalgias, weakness,fever,and dyspnea transferred from [facility] with MRSA bacteremia because he is currently undergoing transplant eval with Dr. Kyung Rudd at [facility] (previously deemed not LVAD/transplant candidate by [facility]).    Interval Events:     - Increased hydralazine due to elevated blood pressures to 150s/80s. Blood pressures went down to 140s/80s.  - Increased metoprolol XL   - Per ID; will remain on Daptomycin starting 2 weeks from first NGTD blood culture (9/12 - 9/26). Right arm midline placed on 9/11 no erythema nontender    Subjective:  Patient would like to go home.    Objective:     Vital signs in last 24 hours:     Temp:  [97.3 F (36.3 C)-97.9 F (36.6 C)] 97.9 F (36.6 C)  Heart Rate:  [89-97] 89  Resp Rate:  [13-18] 13  BP: (132-180)/(67-100) 164/100  SpO2: 100 %   O2 Device: Nasal  cannula  Height: 185.4 cm (6\' 1" )    Weight:     Wt Readings from Last 4 Encounters:   11/18/19 74.3 kg (163 lb 12.8 oz)   10/20/19 70.1 kg (154 lb 8 oz)     Intake/Output:    Intake/Output Summary (Last 24 hours) at 11/18/2019 1158  Last data filed at 11/18/2019 0452  Gross per 24 hour   Intake 1382.73 ml   Output 2900 ml   Net -1517.27 ml     Net IO Since Admission: -2,785.29 mL [11/18/19 1158]    Physical Exam:     General: well-developed, alert, NAD  Eyes: Anicteric sclera, no erythema  ENT:  JVP ~  cm H2O, no carotid bruits. Carotid pulses are 2+ bilaterally  Lungs: CTA B/L, no rhonchi, wheezes or crackles with normal rate and effort  Cardiovascular: s1s2, normal rate, regular rhythm, no m/r/g, PMI laterally displaced  Abdominal: soft, NT/ND, +B.S., no HSM  Extremities: warm to touch, no cyanosis,  edema, peripheral pulses 2+ bilaterally  Neuromuscular exam: grossly non-focal, though not formally tested    Labs:    Basic Metabolic Panel:         11/18/19  0450 11/17/19  0516 11/16/19  1359 11/15/19  0426 11/15/19  0426 11/14/19  1520 11/14/19  0538 11/13/19  0545 11/13/19  0545   Creatinine 2.3* 2.3* 2.2*   < > 2.2*   < > 2.4*   < > 2.3*   BUN 43.0* 46.0* 48.0*   < > 53.0*   < > 57.0*   < > 54.0*   CO2 15* 15* 15*   < > 14*   < > 13*   < > 14*   Sodium 133* 131* 134*   < > 132*   < > 129*   < > 130*   Potassium 4.7 5.0 4.5   < > 5.8*   < > 5.6*   < > 5.0   Magnesium  --  1.6  --   --   --   --  2.0  --  2.0   Phosphorus  --   --   --   --  3.7  --  2.5  --  2.2*   Chloride 110 108 110   < > 109   < > 107   < > 108   Calcium 7.9* 7.8* 7.7*   < > 7.7*   < > 8.1*   < > 8.0*    < > = values in this interval not displayed.     Liver Function Test:        11/16/19  1359 11/12/19  1749   AST (SGOT) 27 41*   ALT 36 29   Bilirubin, Total 0.9 2.3*     Complete Blood Count:        11/18/19  0450 11/17/19  0516 11/16/19  0802   WBC 9.19 8.36 9.80*   Hgb 7.7* 7.4* 8.0*   Hematocrit 24.2* 23.8* 25.2*   Platelets 310 295 289      Coagulation Labs:        11/14/19  1917  11/12/19  1807   PT  --  16.4*   PT INR  --  1.4*   PTT 30 30     Cardiac Markers:        11/16/19  0802 11/13/19  0545 11/13/19  0008 11/12/19  1749   B-Natriuretic Peptide 1,056*  --   --   --    Troponin I  --  0.38* 0.43* 0.41*     Cardiac Studies:    Echocardiogram (11/12/2019):     * The left ventricle is severely dilated.    * Left ventricular wall thickness is normal.    * Severe left ventricular global hypokinesis.    * Left ventricular systolic function is severely decreased with an ejection  fraction by Biplane Method of Discs of  27 %.    * The right ventricular cavity size is severely dilated.    * Decreased right ventricular systolic function.    * TAPSE = 1.8 cm, S' = 11 cm/s, fractional area change = 21 %.    * The left atrium is severely dilated.    * The right atrium is severely dilated.    * There is mild mitral regurgitation.    * There is mild tricuspid regurgitation.    * No valvular vegetations visualized.    * Severe pulmonary hypertension with estimated right ventricular systolic  pressure of  73 mmHg.    * The ascending aorta is mildly dilated.    * Trivial circumferential pericardial effusion visualized.    * No prior study is available for comparison.    Imaging:     No results found.    Medications:    Inpatient Medications:    Scheduled:      DAPTOmycin (CUBICIN) IVPB, 6 mg/kg, Intravenous, Q24H    ferrous sulfate, 324 mg, Oral, Q48H    furosemide, 40 mg, Oral, Daily    hydrALAZINE, 100 mg, Oral, Q8H SCH    isosorbide dinitrate, 30 mg, Oral, TID - Nitrate Free Interval    metoprolol succinate XL, 50 mg, Oral, Daily    pantoprazole, 40 mg, Oral, QAM AC    predniSONE, 30 mg, Oral, QAM W/BREAKFAST    senna-docusate, 1 tablet, Oral, QHS    Infusions:    heparin infusion 25,000 units/500 mL (VTE/Moderate Intensity), Last Rate: 28 Units/kg/hr (11/18/19 0236)  milrinone, Last Rate: 0.25 mcg/kg/min (11/18/19 0236)     Home Medications     Med  List Status: Complete Set By: Carney Bern, RN at 11/13/2019  4:51 AM                busPIRone (BUSPAR) 5 MG tablet     Take 5 mg by mouth 2 (two) times daily     ferrous sulfate 325 (65 FE) MG EC tablet     Take 325 mg by mouth every other day     furosemide (LASIX) 40 MG tablet     Take 40 mg by mouth daily     hydrALAZINE (APRESOLINE) 50 MG tablet     Take 50 mg by mouth 3 (three) times daily     hydrOXYzine (ATARAX) 25 MG tablet     Take 12.5 mg by mouth 3 (three) times daily as needed     isosorbide dinitrate (ISORDIL) 30 MG tablet     Take 30 mg by mouth 3 (three) times daily     magnesium oxide (MAG-OX) 400 MG tablet     Take 800 mg by mouth 2 (  two) times daily     metoprolol succinate XL (TOPROL-XL) 25 MG 24 hr tablet     Take 25 mg by mouth daily     Milrinone Lactate in Dextrose 40-5 MG/200ML-% Solution     Infuse 0.25 mcg/kg/min into the vein continuous     mirtazapine (REMERON) 7.5 MG tablet     Take 7.5 mg by mouth nightly     Multiple Vitamins-Minerals (multivitamin with minerals) tablet     Take 1 tablet by mouth daily     naloxone (Narcan) 4 MG/0.1ML nasal spray     1 spray by Nasal route as needed     nystatin (NYSTOP) powder     Apply 1 application topically as needed     polyethylene glycol (MIRALAX) 17 GM/SCOOP powder     Take 17 g by mouth as needed     Thiamine HCl (VITAMIN B-1 PO)     Take 100 mg by mouth daily     triamcinolone (KENALOG) 0.1 % ointment     Apply 1 application topically as needed     Veltassa 8.4 g Pack     Take 1 packet by mouth daily     Wound Cleansers (Vashe Cleansing) Solution     Apply 1 applicator topically as needed

## 2019-11-18 NOTE — Progress Notes (Signed)
Mccrystal,Makel  VAD/HEART TRANSPLANT EVALUATION (admitted to hospital)  Time spent with patient: 15 minutes with patient, 30 minutes in EMR: Total 45 mins  Date: 11/18/2019    General Nutrition Recommendations: Maintain weight    VAD/Transplant Nutrition Recommendations: Pt is stable from a nutritional standpoint. Pt appears to be nutritionally a low risk VAD/heart transplant candidate.     Frailty: incomplete - will need to complete when Mr Zeidman comes back to clinic.                                    Frailty Assessment:  [place] Frailty Index  Scores: 0= Non Frail, 1-2= Intermediate Frail, 3-5= Frail    Shrinking  0   Exhaustion  0   Physical Activity   0   Walking Speed   n/a - pt refused until outpt appointment   Grip Strength   n/a - pt refused until outpt appointment   TOTAL SCORE   will need to redo at outpatient appointment        Anthropometrics:  Height: 185.4 cm (6\' 1" )  Weight: 74.3 kg (163 lb 12.8 oz)  Body mass index is 21.61 kg/m.    UBW: 140#     Goal Weight: maintain weight as lean body mass vs fluid               Assessment/Visit:  This Clinical research associate had the pleasure meeting with Mr Kaigler, a 44 y.o. y/o male who was evaluated from a nutrition prospective for a VAD/heart transplant evaluation while admitted to [facility]. Per frailty protocol patient's frailty was assessed as medical feasible. Frailty results are listed above.    Intake/Nutrition Hx:   Patient stated prior to the hospitalization he had a good appetite and stated he eats 3-4 meals and 1 snack daily. Pt stated his appetite is fair while admitted to [facility] and stated he is eating 50% of meals. He stated he has no unintentional weight loss. See below for detailed patient average daily PO intake prior to hospitalization. Patient stated he does not follow any specfic eating pattern and eats out about 3x/week. Patient reported his daily fluid intake now around 2L. Patient stated his preferred beverage is juice.       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 Pt reported no N/V/D or constipation.     Activity  Patient reported an energy level of 2 out of 10 while in the hospital but since taking the prednisone prior to the hospitalization has felt the best he has is a long time.  Patient reported being able to walk 2-3 blocks before needing to stop and rest. Patient stated he is able to walk up a flight of stairs. Patient reported no SOB with exertion.  Physical Activity:  none    Fluid Status:  Patient reported no difficulty with urination. Upon physical exam patient had no BLE pitting edema, has no SOB when lying  down.     Detention/Allergies:  Pt reported having all of their natural teeth (potentially missing [place] teeth) and stated he has no pain or difficulty with biting, chewing or swallowing. Pt stated he is not allergic to any known foods.    Labs:   BG 119(H), HgbA1c 4.9%, BUN 43(H), Cr 2.3(H), eGFR 37.9(L), K 4.7, Mag 1.6 (on 11/17/19), Albumin 2.1(L)  CRP 3.7(H), Uric Acid 11.3(H), Prealbumin 18  Lipid Panel: TC 83, HDL 26(L), LDL 47, TG 52    Pertinent Medications/Herbal Supplements/ Vitamins:   None reported    Nutrition Dx: Food and nutrition related knowledge deficit r/t post VAD/heart transplant a/e/b no/minimal prior knowledge of VAD/transplant diet guidelines.    Intervention:  Nutrition Rx: DASH/Heart Healthy: low sodium, low protein d/t CKD stage 3    . Provided nutrition education: food safety and food restrictions post transplant   o Patient stated his mom  will provide meals and groceries while he is recovering from surgery.   . Provided Vitamin K consistent eating pattern education  . Provided Heart Healthy Nutrition education (verbal and written)  . Provided trusted online resources for DASH diet eating patterns and recipes    M/E:  . Provide Advanced Heart Failure Team nutrition recommendations above for listing  . Will follow patient on an annual basis unless patient or Advanced Heart Failure team consult earlier    Answered all pt questions and pt verbalized understanding and was provided with this writer's contact information.    Thank you the opportunity to be involved with Mr Carrol's care,  Hamilton Capri, MS, RD, CCTD  Spectra/Office: [phone]   via Epic Secure Chat

## 2019-11-18 NOTE — Discharge Instr - AVS First Page (Addendum)
Reason for your Hospital Admission:  You were admitted for a bloodstream infection and will require IV antibiotics through 9/26.  Then the midline should be removed.    You were diagnosed with a blood clot in both arms and will need blood thinner called Eliquis.  You also need to follow-up with some blood doctor to follow-up the remainder of your labs.      Instructions for after your discharge:  Follow-up with PCP in 1 week.  Determine the dose of maintenance prednisone.    Follow-up with nephrologist in 1 week.  Follow-up with infectious disease Dr. Brand Males in 1 week.  Follow-up with Dr. Berneda Rose her in 2 weeks for the remainder of your labs.  Follow-up with your cardiologist in 1 to 2 weeks.

## 2019-11-18 NOTE — Brief Op Note (Signed)
BRIEF VIR PROCEDURE NOTE    Date Time: 11/18/19 12:23 PM    Patient Name:   [name] [name]    Date of Operation:   11/18/2019    Providers Performing:   Surgeon(s):  Precious Gilding, MD    Assistant (s): RT    Operative Procedure:   Procedure(s):  SL [facility] CATH PLACEMENT    Preoperative Diagnosis:   Pre-Op Diagnosis Codes:     * Heart failure, unspecified HF chronicity, unspecified heart failure type [I50.9]    Postoperative Diagnosis:   * No post-op diagnosis entered *    Anesthesia:   Local anesthesia with 1% lidocaine    Moderate sedation with fentanyl and midazolam    A safety timeout was performed.   Estimated Blood Loss:   Minimal    Implants:   51F single lumen Hohn catheter from right IJ approach    Findings:   Successful single lumen right IJ Hohn catheter placement.    Complications:   None                                          [name] Geddes K. Melvyn Neth, MD    [facility]  [facility], Division of Vascular & Interventional Radiology  [phone]  [place] - [phone]  IFH--[id number] [phone] IFOH--[id number]    FX CARDIAC CATH

## 2019-11-18 NOTE — Discharge Summary (Signed)
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 Micro / Labs / Path pending:   Unresulted Labs     Procedure . . . Date/Time    Beta-2 glycoprotein antibodies [[id number]] Collected: 11/17/19 0516    Specimen: Blood Updated: 11/17/19 0716    Anti-Cardiolipin Antibody Panel 1 [[id number]] Collected: 11/16/19 0000     Updated: 11/17/19 0646    Narrative:      Rescheduled by [id number] at 11/17/2019 05:21 Reason: Duplicate order    Protein C Antigen [[id number]] Collected: 11/17/19 0516     Updated: 11/17/19 0614    Narrative:      platelet-poor plamsa    Protein S, total and free [[id number]] Collected: 11/17/19 0516    Specimen: Blood Updated: 11/17/19 8413    Narrative:      Do not refrigerate.  Centrifuge and freeze plasma within 1 hour of collection.    Lupus anticoagulant [[id number]] Collected: 11/17/19 0516    Specimen: Blood Updated: 11/17/19 5366    Narrative:      Do not refrigerate.  Centrifuge and freeze plasma within 1 hour of collection.  Do not refrigerate.  Centrifuge and freeze plasma within 1 hour of collection.  Do not refrigerate.  Centrifuge and freeze plasma within 1 hour of collection.    Prothrombin (Factor II) G20210A Mutation Analysis [[id number]] Collected: 11/17/19 0516    Specimen: Blood Updated: 11/17/19 0612    Narrative:      SEND SPECIMEN IN ORIGINAL TUBE    Plasminogen Activ Inhib-1 (PAI-1) 4G/5G Genotyping [[id number]] Collected: 11/17/19 0516     Updated: 11/17/19 0612    HSV Type 1 and 2 IgG [[id number]] Collected: 11/16/19 1359     Updated: 11/16/19 1445    HTLV-I/-II Ab Screen, S  [[id number]] Collected: 11/16/19 1359     Updated: 11/16/19 1445    Toxoplasma Gondii Antibody, IgG [[id number]] Collected: 11/16/19 1359    Specimen: Blood Updated: 11/16/19 1445    T-SPOT, TB Test Results [[id number]] Collected: 11/16/19 1359    Specimen: Blood Updated: 11/16/19 1444    Narrative:      Short Stability.  Requires Special Handling.  Collect Monday-Friday Only.    Solid Organ typing LR class II [[id number]] Collected: 11/16/19 1359     Updated: 11/16/19 1422    Narrative:      Please draw 2 red tops and 2 yellow tops and tube them to  station 801  Rescheduled by [id number] at 11/16/2019 07:54 Reason: printer error  Rescheduled by [id number] at 11/16/2019 08:22 Reason: tubes not available  Rescheduled by [id number] at 11/16/2019 07:54 Reason: printer error  Rescheduled by [id number] at 11/16/2019 08:22 Reason: tubes not available  Draw 2 yellow ACD tubes.    Urine Nicotine and Cotinine [[id number]] Collected: 11/16/19 0802     Updated: 11/16/19 0850    Narrative:      Rescheduled by [id number] at 11/16/2019 07:54 Reason: printer error  Rescheduled by [id number] at 11/16/2019 07:54 Reason: printer error  Random urine/ No preservative.  2. Wound Care Instructions:   3. Date of completion for antibiotics or other medications: Daptomycin IV daily through 9/26 by midline which will have to be removed on the last day of antibiotics  4. Other:     Recent Labs:       Results     Procedure Component Value Units Date/Time    Protein C Activity [[id number]] Collected: 11/17/19 0516    Specimen: Blood Updated: 11/18/19 1433     Protein C Activity 75 %     Glucose Whole Blood - POCT [[id number]] Collected: 11/18/19 1245     Updated: 11/18/19 1250     Whole Blood Glucose POCT 70 mg/dL     Glucose Whole Blood - POCT [[id number]]  (Abnormal) Collected: 11/18/19 0800     Updated: 11/18/19 0814     Whole Blood Glucose POCT 106 mg/dL     Basic Metabolic Panel [[id number]]  (Abnormal) Collected: 11/18/19 0450    Specimen: Blood Updated: 11/18/19 0608      Glucose 119 mg/dL      BUN 62.9 mg/dL      Creatinine 2.3 mg/dL      Calcium 7.9 mg/dL      Sodium 528 mEq/L      Potassium 4.7 mEq/L      Chloride 110 mEq/L      CO2 15 mEq/L      Anion Gap 8.0    GFR [[id number]] Collected: 11/18/19 0450     Updated: 11/18/19 0608     EGFR 37.9    Anti-Xa, UFH [[id number]] Collected: 11/18/19 0450     Updated: 11/18/19 0529     Anti-Xa, UFH 0.59    Narrative:      Every 8 hours after initiation and every rate change until  two subsequent values within goal range, then change  frequency to IHS daily at 0400 until heparin is discontinued.    CBC without differential [[id number]]  (Abnormal) Collected: 11/18/19 0450    Specimen: Blood Updated: 11/18/19 0516     WBC 9.19 x10 3/uL      Hgb 7.7 g/dL      Hematocrit 63.8 %      Platelets 310 x10 3/uL      RBC 2.87 x10 6/uL      MCV 84.3 fL      MCH 26.8 pg      MCHC 31.8 g/dL      RDW 22 %      MPV 9.3 fL      Nucleated RBC 0.0 /100 WBC      Absolute NRBC 0.00 x10 3/uL     Homocysteine, serum [[id number]] Collected: 11/17/19 0955    Specimen: Blood Updated: 11/18/19 0000     HOMOCYSTEINE 13.86 umol/L     CULTURE BLOOD AEROBIC AND ANAEROBIC [[id number]] Collected: 11/12/19 1749    Specimen: Blood, Venipuncture Updated: 11/17/19 2321    Narrative:      The order will result in two separate 8-80ml bottles  Please do NOT order repeat blood cultures if one has been  drawn within the last 48 hours  UNLESS concerned for  endocarditis  AVOID BLOOD CULTURE DRAWS FROM CENTRAL LINE IF POSSIBLE  Indications:->Bacteremia  ORDER#: [id number]                                    ORDERED BY: Reesa Chew  SOURCE: Blood, Venipuncture  COLLECTED:  11/12/19 17:49  ANTIBIOTICS AT COLL.:                                RECEIVED :  11/12/19 20:46  Culture Blood Aerobic and Anaerobic        FINAL       11/17/19 23:21  11/17/19   No growth after 5 days of incubation.      Glucose Whole Blood - POCT [[id number]]  (Abnormal) Collected: 11/17/19  2127     Updated: 11/17/19 2134     Whole Blood Glucose POCT 123 mg/dL     Anti-Xa, UFH [[id number]] Collected: 11/17/19 2057     Updated: 11/17/19 2133     Anti-Xa, UFH 0.53    Narrative:      Every 8 hours after initiation and every rate change until  two subsequent values within goal range, then change  frequency to IHS daily at 0400 until heparin is discontinued.    CULTURE BLOOD AEROBIC AND ANAEROBIC [[id number]] Collected: 11/14/19 1520    Specimen: Blood, Venipuncture Updated: 11/17/19 1821    Narrative:      The order will result in two separate 8-69ml bottles  Please do NOT order repeat blood cultures if one has been  drawn within the last 48 hours  UNLESS concerned for  endocarditis  AVOID BLOOD CULTURE DRAWS FROM CENTRAL LINE IF POSSIBLE  Indications:->Sepsis  Indications:->Bacteremia  ORDER#: [id number]                                    ORDERED BY: LO, ANGELA  SOURCE: Blood, Venipuncture                          COLLECTED:  11/14/19 15:20  ANTIBIOTICS AT COLL.:                                RECEIVED :  11/14/19 17:41  Culture Blood Aerobic and Anaerobic        PRELIM      11/17/19 18:21  11/15/19   No Growth after 1 day/s of incubation.  11/16/19   No Growth after 2 day/s of incubation.  11/17/19   No Growth after 3 day/s of incubation.      CULTURE BLOOD AEROBIC AND ANAEROBIC [[id number]] Collected: 11/14/19 1520    Specimen: Blood, Venipuncture Updated: 11/17/19 1821    Narrative:      The order will result in two separate 8-47ml bottles  Please do NOT order repeat blood cultures if one has been  drawn within the last 48 hours  UNLESS concerned for  endocarditis  AVOID BLOOD CULTURE DRAWS FROM CENTRAL LINE IF POSSIBLE  Indications:->Sepsis  Indications:->Bacteremia  ORDER#: [id number]                                    ORDERED BY: LO, ANGELA  SOURCE: Blood, Venipuncture                          COLLECTED:  11/14/19 15:20  ANTIBIOTICS AT COLL.:  RECEIVED :  11/14/19  17:41  Culture Blood Aerobic and Anaerobic        PRELIM      11/17/19 18:21  11/15/19   No Growth after 1 day/s of incubation.  11/16/19   No Growth after 2 day/s of incubation.  11/17/19   No Growth after 3 day/s of incubation.      Glucose Whole Blood - POCT [[id number]]  (Abnormal) Collected: 11/17/19 1644     Updated: 11/17/19 1703     Whole Blood Glucose POCT 139 mg/dL     Creatine Kinase (CK) [[id number]]  (Abnormal) Collected: 11/17/19 0516    Specimen: Blood Updated: 11/17/19 1511     Creatine Kinase (CK) 14 U/L     Anti-Xa, UFH [[id number]] Collected: 11/17/19 1205     Updated: 11/17/19 1253     Anti-Xa, UFH 0.57    Narrative:      Every 8 hours after initiation and every rate change until  two subsequent values within goal range, then change  frequency to IHS daily at 0400 until heparin is discontinued.    Glucose Whole Blood - POCT [[id number]] Collected: 11/17/19 1132     Updated: 11/17/19 1208     Whole Blood Glucose POCT 91 mg/dL     Glucose Whole Blood - POCT [[id number]] Collected: 11/17/19 0733     Updated: 11/17/19 0745     Whole Blood Glucose POCT 79 mg/dL     Magnesium [[id number]] Collected: 11/17/19 0516    Specimen: Blood Updated: 11/17/19 0730     Magnesium 1.6 mg/dL     Antithrombin III Level [[id number]]  (Abnormal) Collected: 11/17/19 0516    Specimen: Blood Updated: 11/17/19 0726     Antithrombin Activity 53 %     Anti-Xa, UFH [[id number]] Collected: 11/17/19 0516     Updated: 11/17/19 0724     Anti-Xa, UFH 0.28    Narrative:      Every 8 hours after initiation and every rate change until  two subsequent values within goal range, then change  frequency to IHS daily at 0400 until heparin is discontinued.    Beta-2 glycoprotein antibodies [[id number]] Collected: 11/17/19 0516    Specimen: Blood Updated: 11/17/19 0716    Basic Metabolic Panel [[id number]]  (Abnormal) Collected: 11/17/19 0516    Specimen: Blood Updated: 11/17/19 0651     Glucose 95 mg/dL      BUN 30.1 mg/dL      Creatinine 2.3 mg/dL       Calcium 7.8 mg/dL      Sodium 601 mEq/L      Potassium 5.0 mEq/L      Chloride 108 mEq/L      CO2 15 mEq/L      Anion Gap 8.0    GFR [[id number]] Collected: 11/17/19 0516     Updated: 11/17/19 0651     EGFR 37.9    Anti-Cardiolipin Antibody Panel 1 [[id number]] Collected: 11/16/19 0000     Updated: 11/17/19 0646    Narrative:      Rescheduled by [id number] at 11/17/2019 05:21 Reason: Duplicate order    CBC without differential [[id number]]  (Abnormal) Collected: 11/17/19 0516    Specimen: Blood Updated: 11/17/19 0631     WBC 8.36 x10 3/uL      Hgb 7.4 g/dL      Hematocrit 51.7 %      Platelets 295 x10 3/uL      RBC 2.81 x10 6/uL      MCV 84.7 fL  MCH 26.3 pg      MCHC 31.1 g/dL      RDW 22 %      MPV 10.0 fL      Nucleated RBC 0.0 /100 WBC      Absolute NRBC 0.00 x10 3/uL     Protein C Antigen [[id number]] Collected: 11/17/19 0516     Updated: 11/17/19 0614    Narrative:      platelet-poor plamsa    Protein S, total and free [[id number]] Collected: 11/17/19 0516    Specimen: Blood Updated: 11/17/19 7829    Narrative:      Do not refrigerate.  Centrifuge and freeze plasma within 1 hour of collection.    Lupus anticoagulant [[id number]] Collected: 11/17/19 0516    Specimen: Blood Updated: 11/17/19 7846    Narrative:      Do not refrigerate.  Centrifuge and freeze plasma within 1 hour of collection.  Do not refrigerate.  Centrifuge and freeze plasma within 1 hour of collection.  Do not refrigerate.  Centrifuge and freeze plasma within 1 hour of collection.    Prothrombin (Factor II) G20210A Mutation Analysis [[id number]] Collected: 11/17/19 0516    Specimen: Blood Updated: 11/17/19 0612    Narrative:      SEND SPECIMEN IN ORIGINAL TUBE    Plasminogen Activ Inhib-1 (PAI-1) 4G/5G Genotyping [[id number]] Collected: 11/17/19 0516     Updated: 11/17/19 0612    Lactic Acid [[id number]] Collected: 11/17/19 0516    Specimen: Blood Updated: 11/17/19 0547     Lactic Acid 0.8 mmol/L     Anti-Xa, UFH [[id number]] Collected: 11/16/19 2235      Updated: 11/16/19 2332     Anti-Xa, UFH 0.42    Narrative:      Every 8 hours after initiation and every rate change until  two subsequent values within goal range, then change  frequency to IHS daily at 0400 until heparin is discontinued.  Rescheduled by [phone] at 11/16/2019 07:54 Reason: printer error    Lipid panel [[id number]]  (Abnormal) Collected: 11/16/19 1359    Specimen: Blood Updated: 11/16/19 2140     Cholesterol 83 mg/dL      Triglycerides 52 mg/dL      HDL 26 mg/dL      LDL Calculated 47 mg/dL      VLDL Calculated 10 mg/dL      Cholesterol / HDL Ratio 3.2    EPSTEIN-BARR VIRUS VCA, IGG [[id number]]  (Abnormal) Collected: 11/16/19 1359    Specimen: Blood Updated: 11/16/19 2106     EBV VCA Ab, IgG >750.0 U/mL     Narrative:      Rescheduled by [id number] at 11/16/2019 07:54 Reason: printer error  Rescheduled by [id number] at 11/16/2019 08:22 Reason: tubes not available    Rubella antibody, IgG [[id number]] Collected: 11/16/19 1359    Specimen: Blood Updated: 11/16/19 2106     Rubella AB, IgG 4.35    Narrative:      Rescheduled by [id number] at 11/16/2019 07:54 Reason: printer error  Rescheduled by [id number] at 11/16/2019 08:22 Reason: tubes not available    Mumps antibody, IgG [[id number]] Collected: 11/16/19 1359    Specimen: Blood Updated: 11/16/19 2106     Mumps Ab, IgG 89.0    Narrative:      Rescheduled by [id number] at 11/16/2019 07:54 Reason: printer error  Rescheduled by [id number] at 11/16/2019 08:22 Reason: tubes not available    Rubeola antibody IgG [[id number]] Collected: 11/16/19 1359    Specimen: Blood Updated: 11/16/19 2105  Rubeola (Measles), IgG 114.0    Narrative:      Rescheduled by [id number] at 11/16/2019 07:54 Reason: printer error  Rescheduled by [id number] at 11/16/2019 08:22 Reason: tubes not available    Varicella Zoster Antibody, IgG [[id number]] Collected: 11/16/19 1359    Specimen: Blood Updated: 11/16/19 2105     Varicella, IgG 766.8    Narrative:      Rescheduled by [id number] at 11/16/2019 07:54 Reason: printer  error  Rescheduled by [id number] at 11/16/2019 08:22 Reason: tubes not available    Cytomegalovirus (CMV) antibody, IgG, EIA [[id number]]  (Abnormal) Collected: 11/16/19 1359    Specimen: Blood Updated: 11/16/19 2105     CMV AB, IgG 0.81 U/mL     Narrative:      Rescheduled by [id number] at 11/16/2019 07:54 Reason: printer error  Rescheduled by [id number] at 11/16/2019 08:22 Reason: tubes not available    Syphilis Screen IgG and IgM [[id number]] Collected: 11/16/19 1359     Updated: 11/16/19 2034     Syphilis Screen IgG and IgM Nonreactive    Narrative:      Rescheduled by [id number] at 11/16/2019 07:54 Reason: printer error  Rescheduled by [id number] at 11/16/2019 08:22 Reason: tubes not available    Hepatitis C (HCV) antibody, Total [[id number]] Collected: 11/16/19 1359    Specimen: Blood Updated: 11/16/19 2034     Hepatitis C, AB Non-Reactive    Hepatitis B core antibody, total [[id number]] Collected: 11/16/19 1359    Specimen: Blood Updated: 11/16/19 2033     Hepatitis B Core Total AB Nonreactive    Hepatitis B (HBV) Surface Antibody Quant [[id number]] Collected: 11/16/19 1359    Specimen: Blood Updated: 11/16/19 2033     HEPATITIS B SURFACE ANTIBODY <3.31    Hepatitis B (HBV) Surface Antigen w/ Reflex to Confirmation [[id number]] Collected: 11/16/19 1359    Specimen: Blood Updated: 11/16/19 2032     Hepatitis B Surface Antigen Non-Reactive    PSA [[id number]] Collected: 11/16/19 1359    Specimen: Blood Updated: 11/16/19 2032     Prostate Specific Antigen, Total 0.926 ng/mL     TSH [[id number]] Collected: 11/16/19 1359    Specimen: Blood Updated: 11/16/19 1548     TSH 3.85 uIU/mL     ABO/Rh [[id number]] Collected: 11/16/19 1359    Specimen: Blood Updated: 11/16/19 1546     ABO Rh O POS    Antibody Screen [[id number]] Collected: 11/16/19 1359    Specimen: Blood Updated: 11/16/19 1546     AB Screen Gel NEG    Lactate dehydrogenase [[id number]] Collected: 11/16/19 1359    Specimen: Blood Updated: 11/16/19 1527     LDH 129 U/L     Comprehensive  metabolic panel [[id number]]  (Abnormal) Collected: 11/16/19 1359    Specimen: Blood Updated: 11/16/19 1527     Glucose 82 mg/dL      BUN 71.0 mg/dL      Creatinine 2.2 mg/dL      Sodium 626 mEq/L      Potassium 4.5 mEq/L      Chloride 110 mEq/L      CO2 15 mEq/L      Calcium 7.7 mg/dL      Protein, Total 7.7 g/dL      Albumin 2.1 g/dL      AST (SGOT) 27 U/L      ALT 36 U/L      Alkaline Phosphatase 154 U/L      Bilirubin, Total 0.9 mg/dL      Globulin 5.6 g/dL  Albumin/Globulin Ratio 0.4     Anion Gap 9.0    GFR [[id number]] Collected: 11/16/19 1359     Updated: 11/16/19 1527     EGFR 39.9    C Reactive Protein [[id number]]  (Abnormal) Collected: 11/16/19 1359    Specimen: Blood Updated: 11/16/19 1527     C-Reactive Protein 3.7 mg/dL     Uric acid [[id number]]  (Abnormal) Collected: 11/16/19 1359    Specimen: Blood Updated: 11/16/19 1527     Uric acid 11.3 mg/dL     Prealbumin [[id number]] Collected: 11/16/19 1359    Specimen: Blood Updated: 11/16/19 1527     Prealbumin 18.0 mg/dL     HSV Type 1 and 2 IgG [[id number]] Collected: 11/16/19 1359     Updated: 11/16/19 1445    HTLV-I/-II Ab Screen, S [[id number]] Collected: 11/16/19 1359     Updated: 11/16/19 1445    Toxoplasma Gondii Antibody, IgG [[id number]] Collected: 11/16/19 1359    Specimen: Blood Updated: 11/16/19 1445    T-SPOT, TB Test Results [[id number]] Collected: 11/16/19 1359    Specimen: Blood Updated: 11/16/19 1444    Narrative:      Short Stability.  Requires Special Handling.  Collect Monday-Friday Only.    Solid Organ Typing LR class I [[id number]] Collected: 11/16/19 1359     Updated: 11/16/19 1442     Solid Organ typing LR class I Collection Comp    Narrative:      Please draw 2 red tops and 2 yellow tops and tube them to  station 801  Rescheduled by [id number] at 11/16/2019 07:54 Reason: printer error  Rescheduled by [id number] at 11/16/2019 08:22 Reason: tubes not available    Lactic Acid [[id number]] Collected: 11/16/19 1359    Specimen: Blood Updated: 11/16/19  1442     Lactic Acid 0.7 mmol/L     Narrative:      Rescheduled by [id number] at 11/16/2019 07:54 Reason: printer error  Rescheduled by [id number] at 11/16/2019 08:22 Reason: tubes not available    Solid Organ typing LR class II [[id number]] Collected: 11/16/19 1359     Updated: 11/16/19 1422    Narrative:      Please draw 2 red tops and 2 yellow tops and tube them to  station 801  Rescheduled by [id number] at 11/16/2019 07:54 Reason: printer error  Rescheduled by [id number] at 11/16/2019 08:22 Reason: tubes not available  Rescheduled by [id number] at 11/16/2019 07:54 Reason: printer error  Rescheduled by [id number] at 11/16/2019 08:22 Reason: tubes not available  Draw 2 yellow ACD tubes.    Hemoglobin A1C [[id number]] Collected: 11/16/19 0802    Specimen: Blood Updated: 11/16/19 1250     Hemoglobin A1C 4.9 %      Average Estimated Glucose 93.9 mg/dL     Narrative:      Rescheduled by [id number] at 11/16/2019 07:54 Reason: printer error    Urine Microalbumin Random [[id number]]  (Abnormal) Collected: 11/16/19 0802     Updated: 11/16/19 1231     Urine Microalbumin, Random 84.0     Urine Creatinine, Random 63.6 mg/dL      Urine Microalbumin/Creatinine Ratio 132 ug/mg     Narrative:      Rescheduled by [id number] at 11/16/2019 07:54 Reason: printer error    Anti-Xa, UFH [[id number]] Collected: 11/16/19 0802     Updated: 11/16/19 1010     Anti-Xa, UFH <0.04    B-type Natriuretic Peptide [[id number]]  (Abnormal) Collected: 11/16/19 0802    Specimen: Blood Updated: 11/16/19 8546  B-Natriuretic Peptide 1,056 pg/mL     Narrative:      Rescheduled by [id number] at 11/16/2019 07:54 Reason: printer error    Rapid drug screen, urine [[id number]]  (Abnormal) Collected: 11/16/19 0802    Specimen: Urine Updated: 11/16/19 0911     Urine Amphetamine Screen Negative     Barbiturate Screen, UR Negative     Benzodiazepine Screen, UR Negative     Cannabinoid Screen, UR Negative     Cocaine, UR Negative     Opiate Screen, UR Positive     PCP Screen, UR Negative    Narrative:       Rescheduled by [id number] at 11/16/2019 07:54 Reason: printer error    CBC without differential [[id number]]  (Abnormal) Collected: 11/16/19 0802    Specimen: Blood Updated: 11/16/19 0857     WBC 9.80 x10 3/uL      Hgb 8.0 g/dL      Hematocrit 44.0 %      Platelets 289 x10 3/uL      RBC 3.00 x10 6/uL      MCV 84.0 fL      MCH 26.7 pg      MCHC 31.7 g/dL      RDW 22 %      MPV 10.1 fL      Nucleated RBC 0.0 /100 WBC      Absolute NRBC 0.00 x10 3/uL     Narrative:      Q48H  Rescheduled by [id number] at 11/16/2019 07:54 Reason: printer error    Urine Nicotine and Cotinine [[id number]] Collected: 11/16/19 0802     Updated: 11/16/19 0850    Narrative:      Rescheduled by [id number] at 11/16/2019 07:54 Reason: printer error  Rescheduled by [id number] at 11/16/2019 07:54 Reason: printer error  Random urine/ No preservative.    Solid Organ HLA Antibody Screen [[id number]] Collected: 11/16/19 0802     Updated: 11/16/19 0849     Solid Organ HLA Antibody Screen Collection Comp    Narrative:      Please draw 2 red tops and 2 yellow tops and tube them to  station 801  Rescheduled by [id number] at 11/16/2019 07:54 Reason: printer error  labels not available    CULTURE CATHETER TIP [[id number]] Collected: 11/13/19 2216    Specimen: Catheter Tip, PICC Updated: 11/16/19 0807    Narrative:      ORDER#: [id number]                                    ORDERED BY: SHI, PETER  SOURCE: Catheter Tip, PICC picc                      COLLECTED:  11/13/19 22:16  ANTIBIOTICS AT COLL.:                                RECEIVED :  11/13/19 23:38  Culture Catheter Tip                       FINAL       11/16/19 08:07   +  11/15/19   <15 CFU Staphylococcus aureus               No further work,             Questionable significance due  to low quantity    11/15/19   <15 CFU Staphylococcus epidermidis               No further work,             Questionable significance due to low quantity        Glucose Whole Blood - POCT [[id number]]  (Abnormal) Collected: 11/15/19 2046     Updated:  11/15/19 2058     Whole Blood Glucose POCT 134 mg/dL     CULTURE BLOOD AEROBIC AND ANAEROBIC [[id number]] Collected: 11/12/19 1749    Specimen: Blood, Venipuncture Updated: 11/15/19 1230    Narrative:      [id number] called Micro Results of_BldCx. Results read back by:[id number], by [id number]  on 11/14/2019 at 01:29  The order will result in two separate 8-45ml bottles  Please do NOT order repeat blood cultures if one has been  drawn within the last 48 hours  UNLESS concerned for  endocarditis  AVOID BLOOD CULTURE DRAWS FROM CENTRAL LINE IF POSSIBLE  Indications:->Bacteremia  ORDER#: [id number]                                    ORDERED BY: Reesa Chew  SOURCE: Blood, Venipuncture                          COLLECTED:  11/12/19 17:49  ANTIBIOTICS AT COLL.:                                RECEIVED :  11/12/19 20:46  [id number]_ called Micro Results of_BldCx. Results read back by:[id number], by [id number] on 11/14/2019 at 01:29  Culture Blood Aerobic and Anaerobic        PRELIM      11/15/19 12:30   +  11/13/19   No Growth after 1 day/s of incubation.  11/14/19   Aerobic Blood Culture Positive in less than 24 hrs             Gram Stain Shows: Gram positive cocci in clusters               Rapid identification will be performed using the [facility] BCID-GP             Panel. This testing is only performed on the first positive blood             culture per patient with Gram positive bacteria. Please see BCID-GP             Panel(Blood Culture Rapid Identification: Gram Positive Panel)             results located under Blood Cultures on the Result Review tree.  11/15/19   Anaerobic culture no growth to date, final report to follow  11/15/19   Growth of Staphylococcus epidermidis               Possible skin contaminant, susceptibility testing not             performed without request unless additional blood cultures,             collected within 48 hours of this culture, become positive.             Contact the laboratory for further information if  necessary.        Basic Metabolic Panel [[id number]]  (  Abnormal) Collected: 11/15/19 0426    Specimen: Blood Updated: 11/15/19 0712     Glucose 118 mg/dL      BUN 16.1 mg/dL      Creatinine 2.2 mg/dL      Calcium 7.7 mg/dL      Sodium 096 mEq/L      Potassium 5.8 mEq/L      Chloride 109 mEq/L      CO2 14 mEq/L      Anion Gap 9.0    Narrative:      Rescheduled by [id number] at 11/15/2019 04:25 Reason: Unable to Scan   Armband/label - use downtime procedure to collect.    Phosphorus [[id number]] Collected: 11/15/19 0426    Specimen: Blood Updated: 11/15/19 9147     Phosphorus 3.7 mg/dL     Narrative:      Rescheduled by [id number] at 11/15/2019 04:25 Reason: Unable to Scan   Armband/label - use downtime procedure to collect.    GFR [[id number]] Collected: 11/15/19 0426     Updated: 11/15/19 0712     EGFR 39.9    Narrative:      Rescheduled by [id number] at 11/15/2019 04:25 Reason: Unable to Scan   Armband/label - use downtime procedure to collect.    CBC and differential [[id number]]  (Abnormal) Collected: 11/15/19 0426    Specimen: Blood Updated: 11/15/19 0508     WBC 13.23 x10 3/uL      Hgb 8.5 g/dL      Hematocrit 41.3 %      Platelets 242 x10 3/uL      RBC 3.17 x10 6/uL      MCV 84.9 fL      MCH 26.8 pg      MCHC 31.6 g/dL      RDW 22 %      MPV 10.7 fL      Neutrophils 86.6 %      Lymphocytes Automated 8.6 %      Monocytes 3.4 %      Eosinophils Automated 0.2 %      Basophils Automated 0.2 %      Immature Granulocytes 1.0 %      Nucleated RBC 0.0 /100 WBC      Neutrophils Absolute 11.47 x10 3/uL      Lymphocytes Absolute Automated 1.14 x10 3/uL      Monocytes Absolute Automated 0.45 x10 3/uL      Eosinophils Absolute Automated 0.02 x10 3/uL      Basophils Absolute Automated 0.02 x10 3/uL      Immature Granulocytes Absolute 0.13 x10 3/uL      Absolute NRBC 0.00 x10 3/uL     Narrative:      Rescheduled by [id number] at 11/15/2019 04:25 Reason: Unable to Scan   Armband/label - use downtime procedure to collect.    Anti-Xa, UFH [[id number]]  Collected: 11/15/19 0426     Updated: 11/15/19 0506     Anti-Xa, UFH <0.04    Narrative:      Every 8 hours after initiation and every rate change until  two subsequent values within goal range, then change  frequency to IHS daily at 0400 until heparin is discontinued.  Rescheduled by [id number] at 11/15/2019 04:25 Reason: Unable to Scan   Armband/label - use downtime procedure to collect.    Lactic Acid [[id number]] Collected: 11/15/19 0426    Specimen: Blood Updated: 11/15/19 0445     Lactic Acid 1.4 mmol/L     Narrative:      Rescheduled by  [id number] at 11/15/2019 04:25 Reason: Unable to Scan   Armband/label - use downtime procedure to collect.    APTT [[id number]] Collected: 11/14/19 1917     Updated: 11/14/19 1955     PTT 30 sec     Narrative:      Obtain baseline aPTT prior to heparin initiation if not drawn  previously, to evaluate for underlying coagulopathy or  recent non-heparin anticoagulant. exposure  Every 8 hours after initiation and every rate change until  two subsequent values within goal range, then change  frequency to IHS daily at 0400 until heparin is discontinued.    Anti-Xa, UFH [[id number]] Collected: 11/14/19 1917     Updated: 11/14/19 1955     Anti-Xa, UFH <0.04    Narrative:      Obtain baseline aPTT prior to heparin initiation if not drawn  previously, to evaluate for underlying coagulopathy or  recent non-heparin anticoagulant. exposure  Every 8 hours after initiation and every rate change until  two subsequent values within goal range, then change  frequency to IHS daily at 0400 until heparin is discontinued.    CBC without differential [[id number]]  (Abnormal) Collected: 11/14/19 1917    Specimen: Blood Updated: 11/14/19 1941     WBC 14.04 x10 3/uL      Hgb 8.4 g/dL      Hematocrit 84.6 %      Platelets 210 x10 3/uL      RBC 3.11 x10 6/uL      MCV 82.3 fL      MCH 27.0 pg      MCHC 32.8 g/dL      RDW 22 %      MPV 9.8 fL      Nucleated RBC 0.0 /100 WBC      Absolute NRBC 0.00 x10 3/uL      Narrative:      Obtain baseline aPTT prior to heparin initiation if not drawn  previously, to evaluate for underlying coagulopathy or  recent non-heparin anticoagulant. exposure  Every 8 hours after initiation and every rate change until  two subsequent values within goal range, then change  frequency to IHS daily at 0400 until heparin is discontinued.          Procedures/Radiology performed:   Radiology: all results from this admission  CT Chest Abdomen Pelvis WO Contrast    Result Date: 11/16/2019   CT of the chest shows minimal left basilar atelectasis. The lungs are otherwise clear. The examination shows an unremarkable appearance of the thoracic aorta. Note the limitations of this study which was performed without contrast. Cardiomegaly is noted. CT scan of the abdomen and pelvis shows minimal pelvic ascites of uncertain etiology. No acute appearing bowel abnormalities are seen. The examination shows mild body wall edema. A normal-appearing appendix is visualized. [name], MD  11/16/2019 4:33 AM    US Abdomen Complete    Result Date: 11/16/2019  1. Partially contracted gallbladder without cholelithiasis or cholecystitis. 2. Echogenic kidneys compatible with medical renal disease. Collene Schlichter, MD  11/16/2019 12:51 PM    US Carotid Duplex Dopp Comp Bilateral    Result Date: 11/16/2019   1. No signal plaque in the right carotid bulb and proximal internal carotid artery with no hemodynamically significant stenosis. 2. Minimal plaque in the left carotid bulb and proximal internal carotid artery with less than 50% and no hemodynamically significant stenosis. Adelina Mings, MD  11/16/2019 11:47 AM    US Venous Duplex Doppler Arm Left  Result Date: 11/14/2019  1. Occlusive deep venous thrombosis of the left subclavian vein, axillary vein, and brachial veins. 2. Occlusive superficial thrombus in the left basilic vein. Ordering physician, patient's RN, and charge nurse were not reachable to relay these results at the  time of dictation. Janina Mayo, MD  11/14/2019 4:36 PM    US Venous Duplex Doppler Arm Right    Result Date: 11/15/2019  1. Superficial and deep venous thrombosis right upper extremity as above. 2. Nonocclusive thrombus left innominate vein. 3. Urgent findings discussed with Dr. Verdene Lennert at 4 PM on 11/15/2019. Clide Cliff, MD  11/15/2019 3:59 PM    [place] Noninvas Low Extrem Art Dopp/Press/Wavefrms (PVR) Comp 3-4 Levels    Result Date: 11/16/2019  No arterial insufficiency of the lower extremities at rest with unremarkable noninvasive studies. Adelina Mings, MD  11/16/2019 11:55 AM    Tunneled Cath Placement (Permcath)    Result Date: 11/18/2019   Successful placement of a 4 French single lumen right IJ Hohn catheter. The catheter tip is at the cavoatrial junction. 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 Kyung Rudd at [facility] (previously deemed not LVAD/transplant candidate by [facility]). Admitted to CICU given persistent tachycardiaand c/f cardiogenic component, but CICU course suspects symptoms likely 2/2 sepsis alone. PICC removed and cultures sent, midline placed.      Hospital Course:  # Sepsis 2/2 MRSA bacteremia, POA  Never hypotensive. PICC removed, suspected source as painful swelling at insertion site. BCx at [facility] growing MRSA. Covid neg. No other painful swelling of HA per ICU.  repeat blood cx 9/10 with 1/2 bottles GPC in clusters - staph epi. Cath tip cx 9/11 staph epi. Repeat blood cx 9/12-negative to date.  Seen by infectious disease and changed to daptomycin through 9/26 and then remove PICC line.    #Left upper extremity DVT-Doppler revealed occlusive DVT of the left subclavian vein, axillary vein and brachial  veins, occlusive superficial thrombus in the left basilic vein.  +RUE DVT.  He was treated with a heparin drip and will be switched over to Eliquis 10 mg p.o. twice daily x7 days and then 5 mg twice daily.  He was seen by hematology and has a pending hypercoagulable work-up which will need to be followed up as an outpatient.    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 Larae Grooms deemed not LVAD/transplant candidate by [facility]).  He was seen by advanced heart failure.  Toprol and hydralazine were increased due to elevated blood pressures.  He will continue on Isordil.  Continue home milrinone and will need to follow-up with Dr. Kyung Rudd in the office.      # CKD stage 3  # NAGMA - possibly 2/2 diarrhea at home?  -home patiromer not on formulary- kayexlate as above.  Called outpt nephrologist Dr. Burna Mortimer [phone] and will need to follow-up as an outpatient.    # HA on home prednisone   Currently doing well on prednisone. Pt started 40mg  on 31AUG x10 days with plan to go to 30mg  09SEP x10 days and then 20mg  daily for  10 days.  Discussed with patient and mom they are not sure of home maintenance dose of prednisone which I asked her to follow-up with our PCP or dermatologist.    # Chronic anemia-continue iron repletion.      PCP not on file but mom will call us back with his PCPs contact information.      Discharge Day Exam:  Temp:  [97.3 F (36.3 C)-97.9 F (36.6 C)] 97.5 F (36.4 C)  Heart Rate:  [86-97] 89  Resp Rate:  [13-20] 16  BP: (135-180)/(67-100) 153/92    General: AO*3   Cardiovascular: regular rate and rhythm, no murmurs, rubs or gallops  Lungs: clear to auscultation bilaterally, without wheezing, rhonchi, or rales  Abdomen: soft, non-tender, non-distended; no palpable masses,  normoactive bowel sounds  Extremities: no edema  Other:   Right upper extremity  midline    Consultations:     Treatment Team:   Attending Provider: Verdene Lennert, MD  Consulting Physician: Verdene Lennert, MD  Consulting Physician: Francee Piccolo, MD    Discharge Condition:     Stable    Discharge Instructions & Follow Up Plan for Patient:         Patient was instructed to follow up with:     Follow-up Information     Pollner, Camillo Flaming, MD Follow up in 1 week(s).    Specialty: Infectious Disease  Contact information:  [address]  [place] [address]  [phone]             Tivis Ringer, MD Follow up.    Specialties: Hematology, Medical Oncology, Internal Medicine  Contact information:  [address]  [address] - [facility]  [place] [address]  [phone]             Scherrie Merritts, MD Follow up in 1 week(s).    Specialties: Advanced Heart Failure and Transplant Cardiology, Cardiology  Contact information:  [address]  [facility] Heart Failure/Transplant  [facility] [place] [address]  [phone]                     Complete instructions and follow up are in the patient's After Visit Summary    Minutes spent coordinating discharge and reviewing discharge plan:35 minutes    Discharge Medications:        Discharge Medication List      Taking    apixaban 5 MG  Commonly known as: ELIQUIS  Start taking on: November 18, 2019  Take 2 tablets (10 mg total) by mouth every 12 (twelve) hours for 7 days, THEN 1 tablet (5 mg total) every 12 (twelve) hours for 23 days.     DAPTOmycin 500 mg in sodium chloride 0.9 % 100 mL IVPB  Dose: 6 mg/kg  Start taking on: November 19, 2019  Infuse 500 mg into the vein every 24 hours for 9 days [facility]  [address]   [place] [address]  [phone]  Then remove midline.     famotidine 20 MG tablet  Dose: 20 mg  Commonly known as: PEPCID  Take 1 tablet (20 mg total) by mouth every 12 (twelve) hours     ferrous sulfate 325 (65 FE) MG EC tablet  Dose: 325 mg  Take 325 mg by mouth every other day      furosemide 40 MG tablet  Dose: 40 mg  Commonly known as: LASIX  Take  40 mg by mouth daily     hydrALAZINE 100 MG tablet  Dose: 100 mg  What changed:    medication strength   how much to take   when to take this  Commonly known as: APRESOLINE  Take 1 tablet (100 mg total) by mouth every 8 (eight) hours     hydrOXYzine 25 MG tablet  Dose: 12.5 mg  Commonly known as: ATARAX  Take 12.5 mg by mouth 3 (three) times daily as needed     isosorbide dinitrate 30 MG tablet  Dose: 30 mg  Commonly known as: ISORDIL  Take 30 mg by mouth 3 (three) times daily     magnesium oxide 400 MG tablet  Dose: 800 mg  Commonly known as: MAG-OX  Take 800 mg by mouth 2 (two) times daily     metoprolol succinate XL 50 MG 24 hr tablet  Dose: 50 mg  What changed:    medication strength   how much to take  Commonly known as: TOPROL-XL  Start taking on: November 19, 2019  Take 1 tablet (50 mg total) by mouth daily     Milrinone Lactate in Dextrose 40-5 MG/200ML-% Soln  Dose: 0.25 mcg/kg/min  Infuse 0.25 mcg/kg/min into the vein continuous     mirtazapine 7.5 MG tablet  Dose: 7.5 mg  Commonly known as: REMERON  Take 7.5 mg by mouth nightly     multivitamin with minerals tablet  Dose: 1 tablet  Take 1 tablet by mouth daily     Narcan 4 MG/0.1ML nasal spray  Dose: 1 spray  Generic drug: naloxone  1 spray by Nasal route as needed     nystatin powder  Dose: 1 application  Commonly known as: NYSTOP  Apply 1 application topically as needed     polyethylene glycol 17 GM/SCOOP powder  Dose: 17 g  Commonly known as: MIRALAX  Take 17 g by mouth as needed     predniSONE 10 MG tablet  Commonly known as: DELTASONE  Start taking on: November 19, 2019  Take 3 tablets (30 mg total) by mouth every morning with breakfast for 2 days, THEN 2 tablets (20 mg total) every morning with breakfast for 10 days.     triamcinolone 0.1 % ointment  Dose: 1 application  Commonly known as: KENALOG  Apply 1 application topically as needed     Vashe Cleansing Soln  Dose: 1  applicator  Apply 1 applicator topically as needed     Veltassa 8.4 g Pack  Dose: 1 packet  Generic drug: Patiromer Sorbitex Calcium  Take 1 packet by mouth daily     VITAMIN B-1 PO  Dose: 100 mg  Take 100 mg by mouth daily        STOP taking these medications    busPIRone 5 MG tablet  Commonly known as: BUSPAR              (FYI: you must refresh the link after final D/C Med reconciliation)    Immunizations provided:         [employer]   Department of Medicine   P: [phone]   F: [phone]    Signed by: Verdene Lennert, MD    CC: Pcp, None, MD

## 2019-11-18 NOTE — Progress Notes (Signed)
Home Health face-to-face (FTF) Encounter (Order [id number])  Consult  Date: 11/18/2019 Department: [facility] Ordering/Authorizing: Argentina Donovan, MD   Order Information    Order Date/Time Release Date/Time Start Date/Time End Date/Time   11/18/19 11:58 AM None 11/18/19 11:57 AM 11/18/19 11:57 AM   Order Details    Frequency Duration Priority Order Class   Once 1 occurrence Routine Hospital Performed   Standing Order Information    Remaining Occurrences Interval Last Released     0/1 Once 11/18/2019           Provider Information    Ordering User Ordering Provider Authorizing Provider   Argentina Donovan, MD Argentina Donovan, MD Argentina Donovan, MD   Attending Provider(s) Admitting Provider PCP   Casilda Carls, MD; Olin Pia, DO; Marton Redwood, Marzella Schlein, MD; Verdene Lennert, MD Casilda Carls, MD Pcp, None, MD   Comments    Dosing weight 73.1 kg     Needs pump (home pump 4 hours away)     Needs weekly BMP, mag, CBC. Fax to [phone]         Order Questions    Question Answer Comment   Date I saw the patient face-to-face: 11/18/2019    Evidence this patient is homebound because: F. Deconditioned due to advance disease process requiring assistance to leave home     K. Chronic condition that leads to high fatigue & SOB with ambulation > 5 feet    Medical conditions that necessitate Home Health care: E. Exacerbation of disease requiring follow up monitoring     F. New diagnosis & treatment requiring follow up monitoring and management     G. High risk/complex medications requiring instruction and management    Per clinical findings, following services are medically necessary: Skilled Nursing    Clinical findings that support the need for Skilled Nursing. SN will: D. Review medication reconciliation, manage and educate on use and side effects     C. Monitor for signs and symptoms of exacerbation of disease and management     G. Educate on new diagnosis, treatment & management to  prevent re-hospitalization    IVs Central Line Care    Other (please specify) Continuous milrinone 0.25 mcg/kg/min          Process Instructions    Please select Home Care Services medically necessary.     Based on the above findings, I certify that this patient is confined to the home and needs intermittent skilled nursing care, physical therapry and / or speech therapy or continues to need occupational therapy. The patient is under my care, and I have initiated the establishment of the plan of care. This patient will be followed by a physician who will periodically review the plan of care.    Collection Information    Consult Order Info    ID Description Priority Start Date Start Time   [id number] Home Health face-to-face (FTF) Encounter Routine 11/18/2019 11:57 AM   Provider Specialty Referred to   ______________________________________ _____________________________________   Patient Information    Patient Name   [name] [name], [name] [name] Legal Sex   Male DOB   02-Jun-1975   Additional Information    Associated Reports External References   Priority and Order Details [facility]

## 2019-11-19 ENCOUNTER — Encounter: Payer: Self-pay | Admitting: Interventional Radiology and Diagnostic Radiology

## 2019-11-19 DIAGNOSIS — E875 Hyperkalemia: Secondary | ICD-10-CM

## 2019-11-19 LAB — HSV TYPE 1 AND 2 ANTIBODY IGG
Herpes Antibody Type 1 IgG: NEGATIVE
Herpes Antibody Type 2 IgG: POSITIVE

## 2019-11-19 LAB — ECG 12-LEAD
Atrial Rate: 81 {beats}/min
P Axis: 64 degrees
P-R Interval: 168 ms
Q-T Interval: 372 ms
QRS Duration: 80 ms
QTC Calculation (Bezet): 432 ms
R Axis: 245 degrees
T Axis: 74 degrees
Ventricular Rate: 81 {beats}/min

## 2019-11-19 LAB — CBC
Absolute NRBC: 0.03 10*3/uL — ABNORMAL HIGH (ref 0.00–0.00)
Hematocrit: 24.8 % — ABNORMAL LOW (ref 37.6–49.6)
Hgb: 7.7 g/dL — ABNORMAL LOW (ref 12.5–17.1)
MCH: 26.7 pg (ref 25.1–33.5)
MCHC: 31 g/dL — ABNORMAL LOW (ref 31.5–35.8)
MCV: 86.1 fL (ref 78.0–96.0)
MPV: 9.8 fL (ref 8.9–12.5)
Nucleated RBC: 0.4 /100 WBC — ABNORMAL HIGH (ref 0.0–0.0)
Platelets: 328 10*3/uL (ref 142–346)
RBC: 2.88 10*6/uL — ABNORMAL LOW (ref 4.20–5.90)
RDW: 23 % — ABNORMAL HIGH (ref 11–15)
WBC: 7.05 10*3/uL (ref 3.10–9.50)

## 2019-11-19 LAB — LUPUS ANTICOAGULANT
PTT-LA Screen: 121 s — ABNORMAL HIGH (ref <=40)
dRVVT Screen: 46 s — ABNORMAL HIGH (ref <=45)

## 2019-11-19 LAB — GLUCOSE-6-PHOSPHATE DEHYDROGENASE: Glucose-6-Phosphate Dehydrogenase, Quantitative: 8.8 (ref 8.0–11.9)

## 2019-11-19 LAB — BASIC METABOLIC PANEL
Anion Gap: 6 (ref 5.0–15.0)
Anion Gap: 5 (ref 5.0–15.0)
Anion Gap: 9 (ref 5.0–15.0)
BUN: 42 mg/dL — ABNORMAL HIGH (ref 9.0–28.0)
BUN: 44 mg/dL — ABNORMAL HIGH (ref 9.0–28.0)
BUN: 42 mg/dL — ABNORMAL HIGH (ref 9.0–28.0)
CO2: 17 mEq/L — ABNORMAL LOW (ref 22–29)
CO2: 19 mEq/L — ABNORMAL LOW (ref 22–29)
CO2: 17 mEq/L — ABNORMAL LOW (ref 22–29)
Calcium: 8.1 mg/dL — ABNORMAL LOW (ref 8.5–10.5)
Calcium: 8.1 mg/dL — ABNORMAL LOW (ref 8.5–10.5)
Calcium: 8.2 mg/dL — ABNORMAL LOW (ref 8.5–10.5)
Chloride: 107 mEq/L (ref 100–111)
Chloride: 109 mEq/L (ref 100–111)
Chloride: 109 mEq/L (ref 100–111)
Creatinine: 2.5 mg/dL — ABNORMAL HIGH (ref 0.7–1.3)
Creatinine: 2.4 mg/dL — ABNORMAL HIGH (ref 0.7–1.3)
Creatinine: 2.4 mg/dL — ABNORMAL HIGH (ref 0.7–1.3)
Glucose: 117 mg/dL — ABNORMAL HIGH (ref 70–100)
Glucose: 100 mg/dL (ref 70–100)
Glucose: 95 mg/dL (ref 70–100)
Potassium: 6.4 mEq/L (ref 3.5–5.1)
Potassium: 6 mEq/L — ABNORMAL HIGH (ref 3.5–5.1)
Potassium: 5.2 mEq/L — ABNORMAL HIGH (ref 3.5–5.1)
Sodium: 130 mEq/L — ABNORMAL LOW (ref 136–145)
Sodium: 133 mEq/L — ABNORMAL LOW (ref 136–145)
Sodium: 135 mEq/L — ABNORMAL LOW (ref 136–145)

## 2019-11-19 LAB — HEXAGONAL PHASE CONFIRM. (REFLEX): Hexagonal Phase Confirm  (Reflex): NEGATIVE

## 2019-11-19 LAB — TOXOPLASMA GONDII ANTIBODY, IGG
TOXOPLASMA IGG VALUE: <3
Toxoplasma Antibody, IgG: NEGATIVE

## 2019-11-19 LAB — GLUCOSE WHOLE BLOOD - POCT
Whole Blood Glucose POCT: 102 mg/dL — ABNORMAL HIGH (ref 70–100)
Whole Blood Glucose POCT: 129 mg/dL — ABNORMAL HIGH (ref 70–100)

## 2019-11-19 LAB — GFR
EGFR: 34.4
EGFR: 36.1
EGFR: 36.1

## 2019-11-19 LAB — BETA-2 GLYCOPROTEIN ANTIBODIES
Beta-2-Glycoprotein 1 Antibody, IgG: <9.4 U/mL
Beta-2-Glycoprotein 1 Antibody, IgM: <9.4 U/mL

## 2019-11-19 LAB — HTLV-I/-II ANTIBODY SCREEN: HTLV-I/-II Ab Screen, S: NEGATIVE

## 2019-11-19 LAB — DRVVT CONFIRMATION (REFLEX): dRVVT Confirmation (Reflex): NEGATIVE

## 2019-11-19 LAB — ANTI-XA,UFH: Anti-Xa, UFH: 0.46

## 2019-11-19 LAB — PROTEIN S, TOTAL AND FREE
Protein S Antigen Total: 94 (ref 70–140)
Protein S, Free: 142 (ref 57–171)

## 2019-11-19 MED ORDER — SODIUM POLYSTYRENE SULFONATE 15 GM/60ML PO SUSP
15.0000 g | Freq: Once | ORAL | Status: AC
Start: 2019-11-19 — End: 2019-11-19
  Administered 2019-11-19: 15 g via ORAL
  Filled 2019-11-19: qty 60

## 2019-11-19 MED ORDER — NON FORMULARY
8.4000 g | Freq: Every day | Status: DC
Start: 2019-11-19 — End: 2019-11-19

## 2019-11-19 NOTE — Progress Notes (Signed)
Discharge paperwork read over with pt. All questions and concerns were addressed and answered. [employer] transferred via stretcher. [employer] will be pts ride home.

## 2019-11-19 NOTE — Progress Notes (Signed)
Transportation arranged with [employer] [phone]), pick up time 5 pm. Discussed with E. Peabody on 9/16 and approved.  Confirmed with patients mother that patient has Veltassa at home.  All parties updated and agreeable.  Transport packet completed.    [name] [name]

## 2019-11-19 NOTE — Progress Notes (Signed)
Home Health face-to-face (FTF) Encounter (Order [id number])  Consult  Date: 11/18/2019 Department: [facility] Ordering/Authorizing: Argentina Donovan, MD   Order Information    Order Date/Time Release Date/Time Start Date/Time End Date/Time   11/18/19 11:58 AM None 11/18/19 11:57 AM 11/18/19 11:57 AM   Order Details    Frequency Duration Priority Order Class   Once 1 occurrence Routine Hospital Performed   Standing Order Information    Remaining Occurrences Interval Last Released     0/1 Once 11/18/2019           Provider Information    Ordering User Ordering Provider Authorizing Provider   Argentina Donovan, MD Argentina Donovan, MD Argentina Donovan, MD   Attending Provider(s) Admitting Provider PCP   Casilda Carls, MD; Olin Pia, DO; Marton Redwood, Marzella Schlein, MD; Verdene Lennert, MD; Daisy Blossom, MD Casilda Carls, MD Pcp, None, MD   Comments    Dosing weight 73.1 kg     Needs pump (home pump 4 hours away)     Needs weekly BMP, mag, CBC. Fax to [phone]         Order Questions    Question Answer Comment   Date I saw the patient face-to-face: 11/18/2019    Evidence this patient is homebound because: F. Deconditioned due to advance disease process requiring assistance to leave home     K. Chronic condition that leads to high fatigue & SOB with ambulation > 5 feet    Medical conditions that necessitate Home Health care: E. Exacerbation of disease requiring follow up monitoring     F. New diagnosis & treatment requiring follow up monitoring and management     G. High risk/complex medications requiring instruction and management    Per clinical findings, following services are medically necessary: Skilled Nursing    Clinical findings that support the need for Skilled Nursing. SN will: D. Review medication reconciliation, manage and educate on use and side effects     C. Monitor for signs and symptoms of exacerbation of disease and management     G. Educate on new diagnosis, treatment &  management to prevent re-hospitalization    IVs Central Line Care    Other (please specify) Continuous milrinone 0.25 mcg/kg/min          Process Instructions    Please select Home Care Services medically necessary.     Based on the above findings, I certify that this patient is confined to the home and needs intermittent skilled nursing care, physical therapry and / or speech therapy or continues to need occupational therapy. The patient is under my care, and I have initiated the establishment of the plan of care. This patient will be followed by a physician who will periodically review the plan of care.    Collection Information    Consult Order Info    ID Description Priority Start Date Start Time   [id number] Home Health face-to-face (FTF) Encounter Routine 11/18/2019 11:57 AM   Provider Specialty Referred to   ______________________________________ _____________________________________   Acknowledgement Info    For At Acknowledged By Acknowledged On   Placing Order 11/18/19 1158 Roney Mans, RN 11/18/19 1632   Patient Information    Patient Name   [name] [name] Legal Sex   Male DOB   [birth year]   Additional Information    Associated Reports External References   Priority and Order Details [facility]

## 2019-11-19 NOTE — Plan of Care (Signed)
Pt name/Code: [name] [name] (44 y.o. male);Full Code  Admit Date/Dx: 11/12/2019 Cardiomyopathy  Weights: Last 3 Weights for the past 72 hrs (Last 3 readings):   Weight   11/18/19 0800 74.3 kg (163 lb 12.8 oz)   11/17/19 0523 74.4 kg (164 lb)   11/16/19 0424 74.5 kg (164 lb 3.2 oz)       Shift comment:     Bo shift event.    Neuro: AOx 4  Pain: arm, PO oxy and tylenol given.  Rhythm on Tele: SR  Oxygen/Airway: RA  GI:  Continent y Last BM Date: 11/17/19  GU: Continent y  Diet: Diet heart healthy  IV Access: RUA midline, 22 G LFA, Hohn @ right chest  Falls Risk: moderate  Ambulation: Standby  Plan:   - Pain management  -Possible d/c 9/17 with IV antibiotics and IV milrinone  - Continue Milrinone 0.25 mcg/kg/hr  - Setup transport for d/c    Problem: Compromised Tissue integrity  Goal: Damaged tissue is healing and protected  Outcome: Progressing  Goal: Nutritional status is improving  Outcome: Progressing     Problem: Day of Admission - Heart Failure  Goal: Heart Failure Admission  Outcome: Progressing     Problem: Everyday - Heart Failure  Goal: Stable Vital Signs and Fluid Balance  Outcome: Progressing  Goal: Mobility/Activity is Maintained at Optimal Level for Patient  Outcome: Progressing  Goal: Nutritional Intake is Adequate  Outcome: Progressing  Goal: Teaching-Using CHF Warning Zones and Educational Videos  Outcome: Progressing     Problem: Inadequate Cardiac Output  Goal: Adequate tissue perfusion will be maintained  Outcome: Progressing     Problem: Safety  Goal: Patient will be free from injury during hospitalization  Outcome: Progressing  Goal: Patient will be free from infection during hospitalization  Outcome: Progressing     Problem: Pain  Goal: Pain at adequate level as identified by patient  Outcome: Progressing     Problem: Side Effects from Pain Analgesia  Goal: Patient will experience minimal side effects of analgesic therapy  Outcome: Progressing     Problem: Discharge Barriers  Goal: Patient  will be discharged home or other facility with appropriate resources  Outcome: Progressing     Problem: Psychosocial and Spiritual Needs  Goal: Demonstrates ability to cope with hospitalization/illness  Outcome: Progressing     Problem: Moderate/High Fall Risk Score >5  Goal: Patient will remain free of falls  Outcome: Progressing

## 2019-11-19 NOTE — Progress Notes (Signed)
Team evaluation that was set up for today as part of the patient's heart transplant and LVAD evaluation will be cancelled as patient is pending discharge. Discussion made with the patient and the patient' mother that the remainder of the evaluation can be completed as an outpatient and that our administrative staff will reach out to them to coordinate a schedule. Both verbalized understanding and is in agreement of the plan.

## 2019-11-22 ENCOUNTER — Encounter: Payer: Self-pay | Admitting: Cardiovascular Disease

## 2019-11-22 LAB — PROTHROMBIN GENE MUTATION: Prothrombin Mutation: NEGATIVE

## 2019-11-22 NOTE — Progress Notes (Signed)
11/22/19 0934   Discharge Disposition   Patient preference/choice provided? Yes   Physical Discharge Disposition Home, Home Health   Name of Home Health Agency Placement   [facility])   Name of Infusion Company Placement [facility]  (Discharged (IV ABX-daptomycin) and IV Milrinone)   Mode of Transportation Wheelchair [employer]  (H+M Transport)   Patient/Family/POA notified of transfer plan Yes   Patient agreeable to discharge plan/expected d/c date? Yes   Family/POA agreeable to discharge plan/expected d/c date? Yes   Bedside nurse notified of transport plan? Yes   CM Interventions   Follow up appointment scheduled? Yes   Multidisciplinary rounds/family meeting before d/c? Yes   Medicare Checklist   Is this a Medicare patient? No   Patient received 1st IMM Letter? n/a     C. Thresa Ross

## 2019-11-23 ENCOUNTER — Telehealth: Payer: Self-pay

## 2019-11-23 NOTE — Telephone Encounter (Signed)
I spoke with Baxter Hire at [facility] regarding the fax of the patient's lab results and I let her know these labs needed to be sent to Dr. Sheffield Slider. She stated understanding, she also stated Dr. Lorine Bears was on the lab request as well and I provided Dr. Autumn Patty office phone number, will continue with plan of care.

## 2019-11-25 LAB — PLASMINOGEN ACTIVATOR INHIBITOR-1 (PAI-1) 4G/5G GENOTYPING

## 2019-11-30 LAB — BEDSIDE SPIROMETRY WITHOUT BRONCHODILATOR
FEF 25-75% (Pre-Bronch) %Pred: 39 %
FEF 25-75% (Pre-Bronch) Actual: 1.5 L/sec
FEF 25-75% (Pre-Bronch) Pred: 3.75 L/sec
FEF Max (Pre-Bronch) %Pred: 77 %
FEF Max (Pre-Bronch) Actual: 7.64 L/sec
FEF Max (Pre-Bronch) Pred: 9.91 L/sec
FEF2575 (Lower Limit of Normal): 3.13 L/sec
FEF2575 (Standard Deviation): 1.3 L/sec
FEFMax (Lower Limit of Normal): 8.27 L/sec
FEFMax (Standard Deviation): 1.75 L/sec
FEV1 (Lower Limit of Normal): 3.26 L
FEV1 (Pre-Bronch) %Pred: 56 %
FEV1 (Pre-Bronch) Actual: 2.22 L
FEV1 (Pre-Bronch) Pred: 3.9 L
FEV1 (Standard Deviation): 0.54 L
FEV1/FVC (Pre-Bronch) %Pred: 91 %
FEV1/FVC (Pre-Bronch) Actual: 74 %
FEV1/FVC (Pre-Bronch) Pred: 80 %
FVC (Lower Limit of Normal): 4.06 L
FVC (Pre-Bronch) %Pred: 61 %
FVC (Pre-Bronch) Actual: 3.01 L
FVC (Pre-Bronch) Pred: 4.87 L
FVC (Standard Deviation): 0.67 L
PEF (Pre-Actual): 458.2 L/min

## 2019-12-14 ENCOUNTER — Ambulatory Visit: Payer: 59 | Admitting: Hematology & Oncology

## 2019-12-14 ENCOUNTER — Telehealth: Payer: Self-pay

## 2019-12-14 MED ORDER — MAGNESIUM OXIDE 400 MG PO TABS
800.00 mg | ORAL_TABLET | ORAL | Status: DC
Start: 2019-12-14 — End: 2019-12-14

## 2019-12-14 MED ORDER — GABAPENTIN 300 MG PO CAPS
300.00 mg | ORAL_CAPSULE | ORAL | Status: DC
Start: 2019-12-14 — End: 2019-12-14

## 2019-12-14 MED ORDER — PREDNISONE 20 MG PO TABS
20.00 mg | ORAL_TABLET | ORAL | Status: DC
Start: 2019-12-14 — End: 2019-12-14

## 2019-12-14 MED ORDER — BUSPIRONE HCL 5 MG PO TABS
5.00 mg | ORAL_TABLET | ORAL | Status: DC
Start: 2019-12-14 — End: 2019-12-14

## 2019-12-14 MED ORDER — NYSTATIN 100000 UNIT/GM EX POWD
CUTANEOUS | Status: DC
Start: 2019-12-14 — End: 2019-12-14

## 2019-12-14 MED ORDER — CHLORHEXIDINE GLUCONATE 4 % EX LIQD
CUTANEOUS | Status: DC
Start: ? — End: 2019-12-14

## 2019-12-14 MED ORDER — AMLODIPINE BESYLATE 2.5 MG PO TABS
2.50 mg | ORAL_TABLET | ORAL | Status: DC
Start: 2019-12-15 — End: 2019-12-14

## 2019-12-14 MED ORDER — GENERIC EXTERNAL MEDICATION
Status: DC
Start: ? — End: 2019-12-14

## 2019-12-14 MED ORDER — METOPROLOL SUCCINATE ER 50 MG PO TB24
50.00 mg | ORAL_TABLET | ORAL | Status: DC
Start: 2019-12-15 — End: 2019-12-14

## 2019-12-14 MED ORDER — GENERIC EXTERNAL MEDICATION
75.00 mg | Status: DC
Start: 2019-12-14 — End: 2019-12-14

## 2019-12-14 MED ORDER — VASHE WOUND THERAPY EX SOLN
CUTANEOUS | Status: DC
Start: 2019-12-15 — End: 2019-12-14

## 2019-12-14 MED ORDER — GUAIFENESIN 100 MG/5ML PO SYRP
5.00 mL | ORAL_SOLUTION | ORAL | Status: DC
Start: ? — End: 2019-12-14

## 2019-12-14 MED ORDER — PATIROMER SORBITEX CALCIUM 8.4 G PO PACK
8.40 g | PACK | ORAL | Status: DC
Start: 2019-12-14 — End: 2019-12-14

## 2019-12-14 MED ORDER — PANTOPRAZOLE SODIUM 40 MG PO TBEC
40.00 mg | DELAYED_RELEASE_TABLET | ORAL | Status: DC
Start: 2019-12-15 — End: 2019-12-14

## 2019-12-14 MED ORDER — MILRINONE LACTATE IN DEXTROSE 20-5 MG/100ML-% IV SOLN
0.25 ug/kg/min | INTRAVENOUS | Status: DC
Start: ? — End: 2019-12-14

## 2019-12-14 MED ORDER — GENERIC EXTERNAL MEDICATION
30.00 mg | Status: DC
Start: 2019-12-14 — End: 2019-12-14

## 2019-12-14 MED ORDER — SODIUM CHLORIDE 0.9 % IJ SOLN
3.00 mL | INTRAMUSCULAR | Status: DC
Start: 2019-12-14 — End: 2019-12-14

## 2019-12-14 MED ORDER — MULTI-VITAMIN/MINERALS PO TABS
1.00 | ORAL_TABLET | ORAL | Status: DC
Start: 2019-12-15 — End: 2019-12-14

## 2019-12-14 MED ORDER — FERROUS SULFATE 325 (65 FE) MG PO TBEC
325.00 mg | DELAYED_RELEASE_TABLET | ORAL | Status: DC
Start: 2019-12-16 — End: 2019-12-14

## 2019-12-14 MED ORDER — FUROSEMIDE 80 MG PO TABS
80.00 mg | ORAL_TABLET | ORAL | Status: DC
Start: 2019-12-14 — End: 2019-12-14

## 2019-12-14 MED ORDER — MIRTAZAPINE 7.5 MG PO TABS
7.50 mg | ORAL_TABLET | ORAL | Status: DC
Start: 2019-12-14 — End: 2019-12-14

## 2019-12-14 MED ORDER — SALINE NASAL SPRAY 0.65 % NA SOLN
1.00 | NASAL | Status: DC
Start: ? — End: 2019-12-14

## 2019-12-14 MED ORDER — APIXABAN 5 MG PO TABS
5.00 mg | ORAL_TABLET | ORAL | Status: DC
Start: 2019-12-14 — End: 2019-12-14

## 2019-12-14 NOTE — Telephone Encounter (Signed)
[name],MA called patient multiple times regarding his apt with Dr Carney Bern.  [name] [name] spoke to patient's mother who stated patient is currently inpatient,mother asked to Please call back to reschedule.

## 2019-12-14 NOTE — Telephone Encounter (Signed)
Scheduled appointment with Dr. Carney Bern for a hospital follow up on 10/28 att 13pm    Updated records in care everywhere.

## 2019-12-20 ENCOUNTER — Other Ambulatory Visit: Payer: Self-pay

## 2019-12-20 ENCOUNTER — Ambulatory Visit (HOSPITAL_BASED_OUTPATIENT_CLINIC_OR_DEPARTMENT_OTHER)
Admission: RE | Admit: 2019-12-20 | Discharge: 2019-12-20 | Disposition: A | Discharge status: Home / Self Care | Payer: 59 | Source: Ambulatory Visit | Attending: Cardiovascular Disease | Admitting: Cardiovascular Disease

## 2019-12-20 ENCOUNTER — Ambulatory Visit
Admission: RE | Admit: 2019-12-20 | Discharge: 2019-12-20 | Disposition: A | Discharge status: Home / Self Care | Payer: 59 | Source: Ambulatory Visit | Attending: Cardiovascular Disease | Admitting: Cardiovascular Disease

## 2019-12-20 ENCOUNTER — Ambulatory Visit: Payer: 59 | Attending: Cardiovascular Disease | Admitting: Cardiovascular Disease

## 2019-12-20 VITALS — BP 130/90 | HR 118 | Temp 98.4°F | Resp 20 | Wt 177.2 lb

## 2019-12-20 DIAGNOSIS — I517 Cardiomegaly: Secondary | ICD-10-CM | POA: Insufficient documentation

## 2019-12-20 DIAGNOSIS — R9431 Abnormal electrocardiogram [ECG] [EKG]: Secondary | ICD-10-CM | POA: Insufficient documentation

## 2019-12-20 DIAGNOSIS — I509 Heart failure, unspecified: Secondary | ICD-10-CM | POA: Insufficient documentation

## 2019-12-20 DIAGNOSIS — I081 Rheumatic disorders of both mitral and tricuspid valves: Secondary | ICD-10-CM | POA: Insufficient documentation

## 2019-12-20 DIAGNOSIS — I878 Other specified disorders of veins: Secondary | ICD-10-CM | POA: Insufficient documentation

## 2019-12-20 DIAGNOSIS — R000 Tachycardia, unspecified: Secondary | ICD-10-CM | POA: Insufficient documentation

## 2019-12-20 DIAGNOSIS — I428 Other cardiomyopathies: Secondary | ICD-10-CM

## 2019-12-20 MED ORDER — HYDRALAZINE HCL 50 MG PO TABS
150.0000 mg | ORAL_TABLET | Freq: Three times a day (TID) | ORAL | 11 refills | Status: AC
Start: 2019-12-20 — End: 2020-12-19

## 2019-12-20 MED ORDER — HYDRALAZINE HCL 50 MG PO TABS
50.0000 mg | ORAL_TABLET | Freq: Three times a day (TID) | ORAL | 11 refills | Status: DC
Start: 2019-12-20 — End: 2019-12-20

## 2019-12-20 NOTE — Progress Notes (Signed)
The patient reported to [facility] for EKG and Echo today 12/20/2019. Dr. Kyung Rudd stated that it was ok to proceed with both test today, will continue with plan of care.

## 2019-12-20 NOTE — Patient Instructions (Addendum)
Changes for Today:  1. Please go to Cardiac Diagnostics, take elevators to the 1st floor make three lefts and you will be at the department. Please check in at the front desk for an EKG.   2. We will schedule an Echocardiogram as part of your day of consultations and educations. Since you are traveling a long way to the hospital, we will see if some of the consults can be done over MyChart or Zoom.   3. Increase Hydralazine 150 mg three times daily.   4. Please go to [facility] to buy blood pressure cuff. Please check BP and HR daily, Dr. Kyung Rudd is ok with BP 90/60's.   5. Dr. Kyung Rudd discussed risks with heart transplant.   6. After the day of consults and education then we will discuss your follow up visits moving forward. Kingsley Plan will call you with your appointment details.     Living with Heart Failure Information:  1. Follow a sodium restricted diet. Eat no more than 2000 milligrams of sodium per day.  Read labels of packaged foods if you are not familiar with the sodium content per serving.   2. Follow a 64 ounce fluid restriction. This is approximately equal to 2 liters, or 2000 cc's of fluid. Count all of the sources of fluid in your diet: drinks, fruits, vegetables, soups, etc.  3. Weigh yourself daily.  4. Please weigh yourself daily at the same time each day. Report to our nurses a change in your weight of 3 pounds or more in one day, or 5 pounds or more in one week.  Call if you experience a significant change in your heart failure symptoms.  5. Anticipate refills for your medications.   6. Maintain regular exercise. Try to exercise at a level that is comfortable for you for 30 minutes, 4 to 7 times per week.   7. Remain up to date with your vaccinations. Obtain a flu shot annually during flu season. Obtain a pneumococcal vaccination at least once prior to the age of 30, and repeat this again once you pass the age of 40 if it has been 5 years since your last vaccination.  8. Keep track of your  appointments with our clinic and with your other doctors. Please call us if you think that you will miss your appointment with [place].    For clinical question please call one of the nurses below:    - Cathren Harsh, RN: [phone]   - Tessa Lerner, RN: [phone]   - Purcell Nails, RN: [phone]   - Ralph Dowdy, RN: [phone]   - Johnnette Litter, RN: [phone]     Other helpful phone numbers:   - Front Desk: [phone]   - Shakita Wingate, Admin/Scheduler: [phone]   - Dola Factor, Outreach Admin/Scheduler: [phone]   - Jerry Caras, Social Worker: [phone]; [email or web address] .Sena Hitch, Nutritionist: [phone]   - For medication refills, voicemail line: [phone], option #1 then option #3   - Fax number: [phone]

## 2019-12-20 NOTE — Progress Notes (Signed)
Dear Drs. Bensimhon, Zagol, Bergin, Cana, and Zlotoff:    I had the pleasure of seeing [name] [name] today in the Cardiology Clinic for a follow-up visit.  As you know, [name] [name] is a very pleasant 44 y.o. 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  He was initially diagnosed with heart failure in 2018, an LV thrombus was present at diagnosis but has since resolved.  He was followed by Dr. Gala Romney at [facility], last seen in January, as well as Dr. Teofilo Pod in [place].  Cardiac function improved with medical management to LVEF 35-40% in November 2020.  He was hospitalized in December 2020 at [facility] with severe anemia concerning for GI bleed, though EGD and colonoscopy did not reveal a source.  Since then unfortunately he has had worsening heart failure and progressive renal insufficiency.  He had a prolonged hospitalization at [facility] last month for heart failure, he was discharged home on milrinone infusion 0.25 mcg/kg/min.  He was considered for advanced heart failure therapies but declined due to the severity of his hidradenitis suppurativa.      I met [name] [name] in August.  Since then, he's been hospitalized twice, at [facility] in September with MRSA bacteremia and again last month at [facility] with line infection, culture of the catheter found coag negative staph.  He's also started prednisone for treatment of the hidradenitis suppurativa which has helped.  He feels much better than when I met him, his appetite has improved and he's gained back some of the weight he lost.  He reports his healthy weight is in the 180s-190s lb.  He thinks his fluid status is under control.  He's not had chest pain, orthopnea, PND, lower extremity edema, palpitations, lightheadedness, or syncope.  He's joined today by his mother.      Past Medical History:  1.  Heart failure due to nonischemic cardiomyopathy, as above.  2.  LV  thrombus.  3.  Pulmonary embolism, December 2020.  4.  Chronic kidney disease.  5.  Hidradenitis suppurativa, as above.  6.  Iron deficiency anemia.    Social and Family History:  Lives in [place], [place] with his mother.  Has an older brother and an adult son, a second son passed away.  No heart disease in the family.  He quit smoking July 5th, he smoked for 20 years but was down to 1-2 cigarettes/day prior to quitting.  He has a history of alcohol use as well but quit several years ago.  He denies illicit drug use.      Allergies   Allergen Reactions    Isosorb Dinitrate-Hydralazine Other (See Comments)     Severe Headache  Severe Headache      Sulfamethoxazole-Trimethoprim Itching and Rash     Other reaction(s): Hives  Other reaction(s): Hives      Bacitracin Rash     Current Outpatient Medications on File Prior to Visit   Medication Sig Dispense Refill    ferrous sulfate 325 (65 FE) MG EC tablet Take 325 mg by mouth every other day      furosemide (LASIX) 40 MG tablet Take 40 mg by mouth daily      hydrOXYzine (ATARAX) 25 MG tablet Take 12.5 mg by mouth 3 (three) times daily as needed      isosorbide dinitrate (ISORDIL) 30 MG tablet Take 30 mg by mouth 3 (three) times daily      magnesium oxide (MAG-OX) 400 MG  tablet Take 800 mg by mouth 2 (two) times daily      [EXPIRED] metoprolol succinate XL (TOPROL-XL) 50 MG 24 hr tablet Take 1 tablet (50 mg total) by mouth daily 30 tablet 0    Milrinone Lactate in Dextrose 40-5 MG/200ML-% Solution Infuse 0.25 mcg/kg/min into the vein continuous      mirtazapine (REMERON) 7.5 MG tablet Take 7.5 mg by mouth nightly      Multiple Vitamins-Minerals (multivitamin with minerals) tablet Take 1 tablet by mouth daily      naloxone (Narcan) 4 MG/0.1ML nasal spray 1 spray by Nasal route as needed      nystatin (NYSTOP) powder Apply 1 application topically as needed      polyethylene glycol (MIRALAX) 17 GM/SCOOP powder Take 17 g by mouth as needed      Thiamine HCl  (VITAMIN B-1 PO) Take 100 mg by mouth daily      triamcinolone (KENALOG) 0.1 % ointment Apply 1 application topically as needed      Veltassa 8.4 g Pack Take 1 packet by mouth daily      Wound Cleansers (Vashe Cleansing) Solution Apply 1 applicator topically as needed       No current facility-administered medications on file prior to visit.     Review of Systems:  Complete 14 point review of systems was performed.  It is significant for the findings mentioned in the HPI.  All other systems are negative.    Physical Exam:  BP 130/90 (BP Site: Left arm, Patient Position: Sitting, Cuff Size: Large) Comment: patient stated norm lately   Pulse (!) 118 Comment: patient stated norm lately   Temp 98.4 F (36.9 C) (Oral)    Resp 20    Wt 80.4 kg (177 lb 3.2 oz)    SpO2 97% Comment: r.a   BMI 23.38 kg/m    Ht 6' BMI 20.9  Wt Readings from Last 3 Encounters:   12/20/19 80.4 kg (177 lb 3.2 oz)   11/19/19 74.8 kg (165 lb)   10/20/19 70.1 kg (154 lb 8 oz)   03/18/19 159 lb  In general, [name] [name] is a well-developed, well-nourished African American gentleman appearing stated age.  He is alert and oriented times three and appears uncomfortable, he's tearful at times.  There is no scleral icterus.  There is moderate jugular venous distention.  No carotid bruits.  The carotid upstrokes are normal.  The heart has a regular rhythm with normal S1 and S2.  There is a II/VI holosystolic murmur at the apex.  The lungs are clear to auscultation bilaterally.  There is a tunneled RIJ line without evidence of infection.  The abdomen is soft, nontender, and nondistended.  Bowel sounds are present.  There is no hepatosplenomegaly or ascites.  The extremities are warm and well-perfused.  There is no clubbing, cyanosis, or edema.  Radial and pedal pulses are 2+ bilaterally.  On skin exam, there are several tattoos.  There are hidradenitis lesions on his lower abdomen, buttocks, and hips with scarring and sinus tracts.  His face has  scarring from cystic acne.    EKG today sinus 113 bpm, biatrial enlargement, right superior axis, LVH with repolarization abnormalities.      Right heart catheterization 09/15/19 on dobutamine RA 16, PA 58/26/38, PCWP 30, TPG 30, CO/CI/PVR Fick 4.3/2.3/1.9, Thermal 4.9/2.65/1.6.    Right heart catheterization 09/22/19 off inotropes RA 23, PA 55/41/46, PCWP 33, TPG 13, CO/CI/PVR Fick 2.7/1.5/4.8, thermal 3.1/1.7/4.2.    TTE today,  which I independently reviewed and interpreted, found severely impaired LV function, LVH, LVIDd 6.6 cm, moderately impaired RV function, biatrial enlargement, interatrial septum bows to the right, mild MR, at least moderate TR, dilated but collapsible IVC, and estimated PASP 70 mmHg.      Coronary angiography 05/16/16 normal coronary arteries.      PFTs FVC 3.01 L (61%), FEV1 2.22 L (56%), ratio 0.74.    Labs 12/15/19 WBC 10.8, Hgb 12.4, plt 319, creatinine 2.8, sodium 131, potassium 5.4, total bilirubin 0.7, BUN 97.  10/12 albumin 3.4, AST 35, ALT 40. 12/10/19 BNP 506.    PRA 11/16/19 none.     Ref. Range 11/16/2019 13:59   CMV AB, IgG Latest Ref Range: See Below U/mL 0.81 (H)   EBV VCA Ab, IgG Latest Ref Range: See Below U/mL >750.0 (H)   Mumps Ab, IgG Latest Ref Range: See Below  89.0   Rubella AB, IgG Latest Ref Range: See Below  4.35   Rubeola (Measles), IgG Latest Ref Range: See Below  114.0   HTLV-I/-II Ab Screen, S Latest Ref Range: Negative  Negative   Toxoplasma Ab, IgG, S Latest Ref Range: Negative  Negative   TOXOPLASMA IGG VALUE Unknown <3   Hepatitis B Core Total AB Latest Ref Range: Non Reactive  Nonreactive   Hepatitis B Surface AG Latest Ref Range: Non Reactive  Non-Reactive   HEPATITIS B SURFACE ANTIBODY Unknown <3.31   Hepatitis C, AB Latest Ref Range: Non Reactive  Non-Reactive   Herpes Antibody Type 1 IgG Latest Ref Range: Negative  Negative   Herpes Antibody Type 2 IgG Latest Ref Range: Negative  Positive   Syphilis Screen IgG and IgM Unknown Nonreactive   T-SPOT, TB Test  Results Latest Ref Range: Negative  Negative      Ref. Range 11/16/2019 08:02   Urine Microalbumin/Creatinine Ratio Latest Ref Range: 0 - 30 ug/mg 132 (H)      Ref. Range 11/16/2019 13:59   ABO Rh Unknown O POS     [name] [name] is a very pleasant 44 y.o. 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  He's mildly congested on exam today with NYHA class III symptoms, on milrinone.  He has improved since I first met him in August which is encouraging.  I suggested increasing hydralazine from 100 mg to 150 mg three times a day for afterload reduction.      We discussed the evaluation process for advanced heart failure therapies in detail, including age-appropriate cancer screening, testing for infectious diseases such as hepatitis and HIV, and screening for use of nicotine and illicit substances.  Testing also includes cross-sectional imaging, assessment of end organ function, and vascular studies.  We discussed the various team members involved in the evaluation, including cardiac surgeon, social worker, dietician, and transplant and VAD coordinators.  We discussed the multidisciplinary committee which reviews each patient to determine candidacy.  We discussed the need to consider simultaneous kidney transplant due to severity of his renal insufficiency.      We discussed transplant in detail.  We discussed the listing process, the various statuses on the waitlist, and the potential need to escalate support to keep patients stable while awaiting transplant.  We discussed the risks of the transplant surgery, including bleeding, infection, stroke, renal failure, and prolonged mechanical ventilation.  We discussed the competing risks of infection and rejection particularly over the first year of transplant, and the  longer term concerns of malignancy, kidney disease, and allograft vasculopathy.  We discussed the average survival  following transplantation is 12-14 years.      We discussed the need for frequent visits to [facility] during all phases of transplant.  We will certainly do the best we can to consolidate scheduling and we are able to do some follow-up via telehealth, but he will still need to make many trips to [place] sometimes on short notice.      I discussed [name] [name]' situation with Dr. Sondra Barges several weeks ago.  He expects the inflammatory component of his skin disease to improve with immunosuppression, which is supported by the response to prednisone.  There are also sinus tracts which will not improve however and can serve as a nidus for infection, these can be unroofed surgically to reduce infection risk if cardiac status is sufficiently stable.  The two line infections in the last two months are certainly concerning in this regard.  It is also concerning that we attempted to have our consultants meet with [name] [name] during his September hospitalization here at [facility] and he declined.      Thank you very much for allowing me to assist in the care of this delightful patient.  I have asked [name] [name] to schedule full day of testing and consult visits.  Of course, I would be happy to see [name] [name] sooner should the need arise.  Please don't hesitate to contact me if you have any questions or concerns.    Sincerely,    Alba Destine MD  Advanced Heart Failure and Transplant Cardiology    I spent a total of 45 minutes on this patient's care on the day of their visit, excluding the time spent related to any billed procedures.  This time includes face-to-face time with the patient as well as time spent reviewing records, independently interpreting studies, communicating with other health care professionals, coordinating care, and documenting in the medical record.      CC: Pcp, None, MD  Arvilla Meres [phone]   Yehuda Budd [phone]   Hessie Knows [phone]   Romin Bonakdar [phone]   Barrett Zlotoff  [phone]

## 2019-12-21 LAB — ECHOCARDIOGRAM ADULT COMPLETE W CLR/ DOPP WAVEFORM
AV Area (Cont Eq VTI): 3.227
AV Area (Cont Eq VTI): 3.229
AV Mean Gradient: 2.391
AV Mean Gradient: 2.391
AV Peak Velocity: 104.897
AV Peak Velocity: 104.897
Ao Root Diameter (2D): 3.482
BP Mod LV Ejection Fraction: 25.986
IVS Diastolic Thickness (2D): 1.315
IVS Diastolic Thickness (2D): 1.315
LA Dimension (2D): 5.192
LA Dimension (2D): 5.192
LA Volume Index (BP A-L): 0.061
LVID diastole (2D): 6.589
LVID diastole (2D): 6.589
LVID systole (2D): 5.954
LVID systole (2D): 5.954
MV Area (PHT): 9.823
MV E/A: 0.754
MV E/A: 0.754
MV E/e' (Average): 13.798
Prox Ascending Aorta Diameter: 3.243
Prox Ascending Aorta Diameter: 3.243
Pulmonary Valve Findings: NORMAL
RV Basal Diastolic Dimension: 6.729
RV Systolic Pressure: 63.513
RV Systolic Pressure: 71.704
RV Systolic Pressure: 74.796
RV Systolic Pressure: 80.189
RV Systolic Pressure: 86.294
TAPSE: 1.32
TAPSE: 1.405
TAPSE: 1.441
Tricuspid Valve Findings: NORMAL

## 2019-12-21 LAB — ECG 12-LEAD
Atrial Rate: 113 {beats}/min
P Axis: 63 degrees
P-R Interval: 152 ms
Q-T Interval: 322 ms
QRS Duration: 82 ms
QTC Calculation (Bezet): 441 ms
R Axis: 261 degrees
T Axis: 79 degrees
Ventricular Rate: 113 {beats}/min

## 2019-12-23 ENCOUNTER — Telehealth: Payer: Self-pay

## 2019-12-23 NOTE — Progress Notes (Signed)
PRA: 11/16/2019 serum were negative for IgG, HLA class I and class II specific antibody.

## 2019-12-23 NOTE — Telephone Encounter (Signed)
I called the patient to give him right heart catheterization instructions and to get his [facility] RN contact information [name] [name] phone number: [phone]). I gave the patient to following instructions over the phone and emailed him, "Hi [name] [name],     Thank you for speaking over the phone since morning about your appointments, here are the instructions for your right heart catheterization and appointments at [place] on 01/05/2020.     1. I will call [name] [name] today to get your labs one week before your catheterization.   2. Please take your last dose of Eliquis on Monday 01/03/2020, please hold this medication until after your catheterization.   3. You can have clear liquids (water, black coffee or tea, Gatorade) up to 10 am in the morning. Please have nothing to eat or drink after midnight before your procedure.   4. Please hold your Lasix medication the morning of the catheterization and you can take all of your other morning medications with a small sip of water.     5. You are scheduled to see the Palliative Care doctor in the Heart transplant clinic at [place] at 10: 30 am and then you can walk across the hall to the [facility] department to check in for your right heart catheterization. Please let me know if you have any questions, thank you!"    I spoke to [name] [name] at [facility], I asked her to fax [place] most recent lab results and I asked her to add PT/INR to his weekly labs (CBC, CMP) and I also left a voicemail for his [facility] [name] [name], will continue with plan of care.

## 2019-12-27 ENCOUNTER — Other Ambulatory Visit: Payer: Self-pay

## 2019-12-28 ENCOUNTER — Other Ambulatory Visit: Payer: Self-pay

## 2019-12-28 DIAGNOSIS — I5022 Chronic systolic (congestive) heart failure: Secondary | ICD-10-CM

## 2019-12-30 ENCOUNTER — Ambulatory Visit: Payer: 59 | Attending: Hematology & Oncology | Admitting: Hematology & Oncology

## 2019-12-30 ENCOUNTER — Encounter: Payer: Self-pay | Admitting: Hematology & Oncology

## 2019-12-30 DIAGNOSIS — I429 Cardiomyopathy, unspecified: Secondary | ICD-10-CM | POA: Insufficient documentation

## 2019-12-30 DIAGNOSIS — I513 Intracardiac thrombosis, not elsewhere classified: Secondary | ICD-10-CM | POA: Insufficient documentation

## 2019-12-30 DIAGNOSIS — Z86711 Personal history of pulmonary embolism: Secondary | ICD-10-CM | POA: Insufficient documentation

## 2019-12-30 NOTE — Progress Notes (Signed)
HEMATOLOGY VIDEO-CONFERENCE  ENCOUNTER DATE:12/30/2019    Verbal consent has been obtained from the patient to participate in a videoconference visit to minimize exposure to COVID-19. The time spent during this visit discussing medical issues and recommendations was 30 minutes.     HISTORY RELATING TO HEMATOLOGY ISSUES:    [name] [name] is a 44 y.o. male with a prior history of nonischemic cardiomyopathy, LV thrombus, PE, CKD, severe hidradenitis suppurative s/p multiple surgeries on  pred taper who presented to [facility] in transfer from [place] on 11/12/2019 with worsening shortness of breath, weakness, myalgias, fever and MRSA bacteremia for further cardiology evaluation for possible LVAD or transplant. Pt denies known family history of bleeding or clotting disorders. He initially denied prior VTE event but then recalled  he has had a LV thrombus but does not know what AC medication he took or for how long. He denied prior history of other DVT or PE despite documentation in his record of prior PE. Hematology consulted regarding hyper coag eval.     INTERVAL NOTES:  12/30/2019 - Doing "ok" at home.  He feels pain and swelling in arms have resolved.  He continues on Eliquis.  I told him that we would try to get repeat u/s at one of his future [place] clinic visits.  I also mentioned that in light of his medical history and recurrent clotting, that we would likely be recommending he continue on Eliquis indefinitely.  He has 2 cardiology appointments in the month.    Current Outpatient Medications on File Prior to Visit   Medication Sig Dispense Refill    albuterol sulfate HFA (PROVENTIL) 108 (90 Base) MCG/ACT inhaler Inhale 2 puffs into the lungs as needed      amLODIPine (NORVASC) 5 MG tablet Take 2.5 mg by mouth daily      apixaban (ELIQUIS) 5 MG Take 5 mg by mouth 2 (two) times daily      Dextromethorphan-guaiFENesin 5-100 MG/5ML Liquid Take 20 mLs by mouth as needed      ferrous sulfate 325 (65 FE) MG EC  tablet Take 325 mg by mouth every other day      furosemide (LASIX) 40 MG tablet Take 40 mg by mouth daily      gabapentin (NEURONTIN) 100 MG capsule Take 300 mg by mouth 3 (three) times daily      hydrALAZINE (APRESOLINE) 50 MG tablet Take 3 tablets (150 mg total) by mouth 3 (three) times daily 270 tablet 11    hydrOXYzine (ATARAX) 25 MG tablet Take 12.5 mg by mouth 3 (three) times daily as needed      isosorbide dinitrate (ISORDIL) 30 MG tablet Take 30 mg by mouth 3 (three) times daily      magnesium oxide (MAG-OX) 400 MG tablet Take 800 mg by mouth 2 (two) times daily      Milrinone Lactate in Dextrose 40-5 MG/200ML-% Solution Infuse 0.25 mcg/kg/min into the vein continuous      mirtazapine (REMERON) 7.5 MG tablet Take 7.5 mg by mouth nightly      Multiple Vitamins-Minerals (multivitamin with minerals) tablet Take 1 tablet by mouth daily      naloxone (Narcan) 4 MG/0.1ML nasal spray 1 spray by Nasal route as needed      nystatin (NYSTOP) powder Apply 1 application topically as needed      oxyCODONE (ROXICODONE) 5 MG immediate release tablet Take 10 mg by mouth 3 (three) times daily      polyethylene glycol (MIRALAX) 17 GM/SCOOP powder Take  17 g by mouth as needed      predniSONE (DELTASONE) 20 MG tablet Take 20 mg by mouth 2 (two) times daily      triamcinolone (KENALOG) 0.1 % ointment Apply 1 application topically as needed      Veltassa 8.4 g Pack Take 1 packet by mouth daily      Wound Cleansers (Vashe Cleansing) Solution Apply 1 applicator topically as needed       No current facility-administered medications on file prior to visit.       HEMATOLOGY FOCUSED ROS:  General  no fever, chills, night sweats, generalized weakness or fatigue.   Heme: No bleeding, bruising, rash, or swelling in the neck, underarms or groin.  No pain or swelling in upper or lower extremities.  All other systems were reviewed and were negative except as indicated in the HPI.     PHYSICAL EXAMINATION (visual):  Videocam  not working.   Not visually examined.    ASSESSMENT:  44 year old male with NICM on home milrinone under consideration for transplant who has a history of cardiac mural thrombus, DVT and PE.  Most recently with b/l upper extremity DVT.  Recently discharged after treatment for MRSA bacteremia on Eliquis 5 mg bid.    RECOMMENDATIONS:  Continue Eliquis 5 mg q12h, indefinitely.  F/U upper ext (b/l) ultrasound in December  RTC 3 months - video

## 2020-01-05 ENCOUNTER — Ambulatory Visit
Admission: RE | Admit: 2020-01-05 | Discharge: 2020-01-05 | Disposition: A | Discharge status: Home / Self Care | Payer: 59 | Source: Ambulatory Visit | Attending: Cardiovascular Disease | Admitting: Cardiovascular Disease

## 2020-01-05 ENCOUNTER — Other Ambulatory Visit: Payer: Self-pay

## 2020-01-05 ENCOUNTER — Encounter: Payer: Self-pay | Admitting: Internal Medicine

## 2020-01-05 ENCOUNTER — Encounter: Payer: Self-pay | Admitting: Cardiovascular Disease

## 2020-01-05 ENCOUNTER — Encounter
Admission: RE | Disposition: A | Discharge status: Home / Self Care | Payer: Self-pay | Source: Ambulatory Visit | Attending: Cardiovascular Disease

## 2020-01-05 DIAGNOSIS — I429 Cardiomyopathy, unspecified: Secondary | ICD-10-CM | POA: Insufficient documentation

## 2020-01-05 DIAGNOSIS — I2729 Other secondary pulmonary hypertension: Secondary | ICD-10-CM | POA: Insufficient documentation

## 2020-01-05 DIAGNOSIS — I509 Heart failure, unspecified: Secondary | ICD-10-CM

## 2020-01-05 DIAGNOSIS — N183 Chronic kidney disease, stage 3 unspecified: Secondary | ICD-10-CM | POA: Insufficient documentation

## 2020-01-05 DIAGNOSIS — I5023 Acute on chronic systolic (congestive) heart failure: Secondary | ICD-10-CM | POA: Insufficient documentation

## 2020-01-05 DIAGNOSIS — N179 Acute kidney failure, unspecified: Secondary | ICD-10-CM | POA: Insufficient documentation

## 2020-01-05 DIAGNOSIS — D509 Iron deficiency anemia, unspecified: Secondary | ICD-10-CM | POA: Insufficient documentation

## 2020-01-05 DIAGNOSIS — Z86711 Personal history of pulmonary embolism: Secondary | ICD-10-CM | POA: Insufficient documentation

## 2020-01-05 DIAGNOSIS — I5022 Chronic systolic (congestive) heart failure: Secondary | ICD-10-CM

## 2020-01-05 SURGERY — RIGHT HEART CATH ONLY INCL. CO AND SATS
Anesthesia: Local | Laterality: Right

## 2020-01-05 MED ORDER — FENTANYL CITRATE (PF) 50 MCG/ML IJ SOLN (WRAP)
INTRAMUSCULAR | Status: AC
Start: 2020-01-05 — End: ?
  Filled 2020-01-05: qty 2

## 2020-01-05 MED ORDER — HEPARIN (PORCINE) IN NACL 2-0.9 UNIT/ML-% IJ SOLN (WRAP)
INTRAVENOUS | Status: AC
Start: 2020-01-05 — End: ?
  Filled 2020-01-05: qty 500

## 2020-01-05 MED ORDER — FUROSEMIDE 80 MG PO TABS
80.0000 mg | ORAL_TABLET | Freq: Two times a day (BID) | ORAL | 3 refills | Status: AC
Start: 2020-01-05 — End: ?

## 2020-01-05 MED ORDER — MIDAZOLAM HCL 1 MG/ML IJ SOLN (WRAP)
INTRAMUSCULAR | Status: AC
Start: 2020-01-05 — End: ?
  Filled 2020-01-05: qty 2

## 2020-01-05 MED ORDER — LIDOCAINE HCL 1 % IJ SOLN
INTRAMUSCULAR | Status: AC | PRN
Start: 2020-01-05 — End: 2020-01-05
  Administered 2020-01-05: 4 mL

## 2020-01-05 MED ORDER — FENTANYL CITRATE (PF) 50 MCG/ML IJ SOLN (WRAP)
INTRAMUSCULAR | Status: AC | PRN
Start: 2020-01-05 — End: 2020-01-05
  Administered 2020-01-05: 25 ug via INTRAVENOUS

## 2020-01-05 MED ORDER — LIDOCAINE HCL (PF) 1 % IJ SOLN
INTRAMUSCULAR | Status: AC
Start: 2020-01-05 — End: ?
  Filled 2020-01-05: qty 30

## 2020-01-05 MED ORDER — HEPARIN (PORCINE) IN NACL 1000-0.9 UT/500ML-% IV SOLN - TABLE FLUSH (SEDATION NARRATOR)
INTRAVENOUS | Status: AC | PRN
Start: 2020-01-05 — End: 2020-01-05
  Administered 2020-01-05: 1000 [IU]

## 2020-01-05 MED ORDER — MIDAZOLAM HCL 1 MG/ML IJ SOLN (WRAP)
INTRAMUSCULAR | Status: AC | PRN
Start: 2020-01-05 — End: 2020-01-05
  Administered 2020-01-05: 1 mg via INTRAVENOUS

## 2020-01-05 SURGICAL SUPPLY — 12 items
CATH SWAN-GANZ TD 7F 110CM (Catheter) ×1
CATHETER PA STD SG 7FR 110CM LTX STRL 4 (Catheter) ×1
CATHETER PULMONARY ARTERY L110 CM (Catheter) ×1
CATHETER PULMONARY ARTERY L110 CM STANDARD 4 LUMEN THERMODILUTION OD7 (Catheter) ×1 IMPLANT
INTRO SHEATH PINNACLE 7FX10CM (Sheaths) ×1
KIT MCTRDCR TUNG NTNL MAX MNSTCK 4FR 21 (Introducer) ×1
KIT MICROINTRODUCER L7 CM MAX COAXIAL (Introducer) ×1
KIT MICROINTRODUCER L7 CM MAX COAXIAL STIFFEN GUIDEWIRE ECHOGENIC (Introducer) ×1 IMPLANT
KIT MNSTK MAX NTNL ECHO 4F STF (Introducer) ×1
SHEATH INTRO SS .035IN PNCL 7FR 10CM (Sheaths) ×1
SHEATH INTRODUCER .035 IN L10 CM L2.5 CM ID7 FR SNAP ON DILATOR LOCK (Sheaths) ×1 IMPLANT
SHEATH INTRODUCER L10 CM .035 IN SNAP ON (Sheaths) ×1

## 2020-01-05 NOTE — Progress Notes (Signed)
Charge RN called to rm19. Pt refuses 1hr post-sedation recovery (per protocol). Pt states he is leaving the hospital now and his mom will pick him up and drive him home. Attempted several times to educate pt r/t bleeding precautions and fall precautions, pt refuses teaching.     Called and informed Dr. Kyung Rudd of pt's current and pt refuses to recover in [place]. Per Dr. Kyung Rudd if pt refuses 1hr recovery, pt may leave AMA. Updated pt. Pt states he wants to leave AMA now.

## 2020-01-05 NOTE — Progress Notes (Signed)
Outpatient to [facility] room 2 for Right heart Catheterization with Dr. Kyung Rudd. ID band, NPO status, and allergies verified. Vital signs stable. Patient AOX4. Denies pain at this time. PICC line flushed. Procedure and recovery expectation explained to patient at bedside. Call bell within reach; will continue to monitor care and maintain safety.

## 2020-01-05 NOTE — Progress Notes (Signed)
CATH LAB PROCEDURE HANDOFF REPORT    Date Time: 01/05/20 2:42 PM    INDICATIONS:    Heart failure  POST PROCEDURE DEBRIEF:    Right heart cath      ALLERGIES:    Isosorb dinitrate-hydralazine, Sulfamethoxazole-trimethoprim, and Bacitracin   ACC BLEEDING RISK SCORE       MEDICAL HISTORY:    History reviewed. No pertinent past medical history.   ACCESS:    60F sheath in right internal jugular vein  Hemostasis: Manual pressure at 1440    Visual appearance: clean/dry/intact  MEDICATIONS:    Versed: 1 mg IV  Fentanyl: 25 mcg IV    VITALS:    HR:89           Rhythm: sinus rhythm       BP: 132/96   O2 SAT: 97%        PROCEDURE DETAILS:    Outcomes: See physician note                                Last ACT: No results found for: ISTATACTKAOL       Final Chest Pain Assessment::0/10    Report given to: Lovena Neighbours RN    See Physicians Op note/ Report for details
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
## 2020-01-05 NOTE — Discharge Instr - AVS First Page (Addendum)
Interventional Cardiovascular Admission and Recovery  Discharge Instructions  Right Heart Catheterization         ACTIVITY / INCISION CARE:  No driving for 24 hours following the procedure if you received sedation for the procedure. Avoid alcohol and nicotine for the next 24 hours.   No heavy lifting, exercising, sports, bending or touching toes for 24 hours following the procedure.  You may shower 24 hours after the procedure. REMOVE the bandage prior to showering. Gently wash the area with your hand using soap and water. Pat the area dry. Leave the area open to air.  Do not submerge the access site in water (tub bath, pool, etc) until completely healed.  Do not apply any creams, powders, lotions, or ointments to the site.  Do not rub, pick or scratch the area.  Apply a band-aid only if there is oozing or drainage from the site.    NORMAL OBSERVATIONS:  Some tenderness and mild bruising in the area is normal.   Discomfort may be treated with ice and /or a mild pain medication such as ACETAMINOPHEN (Tylenol) if needed.    If you received procedural sedation:  Procedural sedation is medicine to ease discomfort, pain, and anxiety during a procedure. The medicine is often given through an IV (intravenous) line in your arm or hand. In some cases, the medicine may be taken by mouth or inhaled. While you are under sedation, you will likely be awake. But you may not remember anything afterward.   Why procedural sedation is used  Sedation is used for many types of procedures. The goal is to reduce pain, anxiety, and stressful memories of a procedure. It can help your healthcare provider treat you. For example, having a broken bone fixed may be easier if you feel relaxed.   Risks of procedural sedation  Risks and possible side effects include:   Headache  Nausea and vomiting  Unpleasant memory of the procedure  Lowered rate of breathing  Changes in heart rate and blood pressure (rare)  Inhalation of stomach contents into  your lungs (rare)  Side effects will likely go away shortly after the procedure. Your healthcare team will watch your heart rate and breathing during and after your sedation. This is to help prevent problems.   Your own risks may vary. They can be based on your age and your overall health. They also depend on the type of sedation you are given. Talk with your healthcare provider about the risks that apply most to you.     Call your doctor if you develop any of the following symptoms:       1.   Significant bleeding or swelling at the procedure site.             *If there is light bleeding noted from the site, lie flat first then hold firm pressure on the dressing for five (5) minutes and then apply a new band aid if needed. If there is significant bleeding or swelling, lie flat, hold firm pressure at the site and call the doctor for further instructions.       2.  Pain at the procedure site that is not relieved with mild pain medication.       3.  Signs of infection, such as fever greater than 100.4, drainage or redness at the site.    CALL 911:    You are experiencing unrelieved chest pain.  Your puncture site area becomes cold, numb, painful, grayish in color, or   changes from usual color/sensation.  You have significant bleeding or swelling to the puncture site which cannot be controlled.

## 2020-01-05 NOTE — Procedures (Signed)
Right Heart Catheterization    Patient Name:  [name] [name]  MEDICAL RECORD NUMBER [id number]   Date of Birth:  1975/08/19   Date of Procedure:  01/05/2020  Operator: Scherrie Merritts, MD, MD  Assistant: none  Diagnosis: Heart Failure  Sedation: Yes, describe fentanyl 25 mcg and versed 1 mg  Complications: none  Description: Right jugular vein prepped and draped in usual sterile fashion.  Local lidocaine was infiltrated as anesthetic. A 74F micropuncture kit was used.  A 95F sheath was introduced into the vein using a modified Seldinger's technique.  Ultrasound guidance was used.  Using fluoroscopic guidance a balloon tipped pulmonary artery catheter was passed through the veins to the pulmonary artery.  Hemodynamics were assessed.     Pressure (S/D/EDP mmHg) Mean (mmHg) V-waves (mmHg)   Right Atrium  25 29   Right Ventricle 61/19/30     Pulmonary Artery 58/32 41    Wedge  25 26     Heart Rate 90 bpm   Blood Pressure 136/96 mmHg   PA saturation 51 %   Ao saturation (non-invasive) 96 %   Fick CO 3.78 L/min   Fick CI 1.86 L/min/m2   Pulm Vasc Resistance 4.2 WU   Thermodilution CO 3.23 L/min   Thermodilution CI 1.59 L/min/m2   PVR 4.95        Impression:  Ambulatory cardiogenic shock.    Elevated right and left sided filling pressures.   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   Low cardiac output.     Recommendation:  Admission strongly recommended, patient declined despite discussion of risk of worsening heart failure and death.    Increase lasix to 80 mg twice a day.    Resume apixaban tomorrow.  Continue outpatient evaluation for advanced heart failure therapies.       Alba Destine MD  Advanced Heart Failure and Transplant Cardiology

## 2020-01-05 NOTE — Discharge Summary -  Nursing (Signed)
Patient came at 1510 from cath lab. Rn walk into the room where the patient was getting dressing in the stretcher. Patient refused to stay in the streatcher for fall precautions. Patient refused to seat in the chair. Patient want to stay standing in the room. Patient seem very agitated. Patient repeatly stated that he want to leave. RN discuss with patient the importance of staying for recovery hour but patient refused. Patient state he want food, RN offer, but Patient said no, he wanted leave. Patient complain about how he had a 5 hour drive. RN ask if his ride (pt's mother) was here patient stated yes. Rn ask patient to do at least a set of vital and pt agreed. Rn took the vitals while the patient was standing with his arm stretch out to rn. Rn went over the vitals with patient. Rn discuss the risk of leaving AMA. Patient stated again he want to leave. RN had patient sign AMA paperwork, and discharge paperwork. RN gave discharge instruction to patient and walk patient out.

## 2020-01-06 ENCOUNTER — Encounter: Payer: Self-pay | Admitting: Cardiovascular Disease

## 2020-01-14 ENCOUNTER — Telehealth: Payer: Self-pay

## 2020-01-14 NOTE — Telephone Encounter (Signed)
I received notification that the patient cancelled his appointment on Monday due to having high blood sugars. I called to inquire more details regarding this cancellation. He stated that he was admitted to his local hospital on Monday for a high blood sugar and was discharge on Wednesday. I asked him if he was now checking his blood sugars at home and he agreed. He also stated that he knows how to treat high blood sugars at home. I asked him to reconsider if he would truly like to cancel his appointments on Monday at which time he got angry and answered "look I  am not feeling well enough to make the five hour drive". I let him know that I would discuss this information with Dr. Kyung Rudd and get back to him with the next steps, will continue with plan of care.

## 2020-01-17 ENCOUNTER — Other Ambulatory Visit: Payer: 59

## 2020-01-17 ENCOUNTER — Encounter: Payer: 59 | Admitting: Internal Medicine

## 2020-01-17 ENCOUNTER — Institutional Professional Consult (permissible substitution): Payer: 59

## 2020-01-17 ENCOUNTER — Other Ambulatory Visit: Payer: Self-pay | Admitting: Thoracic Surgery (Cardiothoracic Vascular Surgery)

## 2020-01-17 ENCOUNTER — Ambulatory Visit: Payer: 59

## 2020-01-17 ENCOUNTER — Other Ambulatory Visit: Payer: Self-pay

## 2020-01-17 DIAGNOSIS — I5022 Chronic systolic (congestive) heart failure: Secondary | ICD-10-CM

## 2020-01-24 ENCOUNTER — Telehealth: Payer: 59 | Admitting: Thoracic Surgery (Cardiothoracic Vascular Surgery)

## 2020-01-26 NOTE — Committee Review (Addendum)
Patient Discussion Note      Organ being evaluated for: Heart    Transplant Coordinator: Luther Hearing  Transplant Surgeon: Dr. Leeroy Bock      Referring Physician: Sharyl Nimrod    Committee Review Members:  Cardiology Jamesetta Orleans May, MD, Scherrie Merritts, MD, Dianna Rossetti, MD, Frazier Richards, MD PhD, Argentina Donovan, MD, Marko Stai, MD, Lynnae Prude, MD, Marianna Fuss, MD   Finance Charlott Rakes, Marko Plume   Health Care Quality and Management Eppie Gibson   Licensed Clinical Social Worker Quillian Quince, [place]   Pharmacist Daneen Schick Cochrane, PharmD   Transplant Purcell Nails, RN, Luther Hearing, RN, Annye Rusk, RN, Donnald Garre, FNP, Lamar Benes, RN, Willy Eddy, RN, Loanne Drilling, NP, Wendall Stade, RN, [facility] MA, RN   Tristan Schroeder, RN  Thom Chimes, RN  Clyde Lundborg, RN MSN     Additional Discussion Notes and Findings: We are closing the patient's evaluation due to his choice as our center is too far distance for him to travel and we will refer him to [facility] which a closer center. Dr. Kyung Rudd discussed the meeting details with the patient over the phone and we sent the patient a letter with this information.

## 2020-06-27 ENCOUNTER — Telehealth: Payer: Self-pay

## 2020-06-27 NOTE — Telephone Encounter (Signed)
Jacki Cones form [facility] call to let us know they have called the patient many time to schedule a new consult visit and the patient has not returned their phone calls. They are closing the referral and wanted to let us know. I let her know that I would update Dr. Kyung Rudd.
